# Patient Record
Sex: Female | Born: 1995 | Race: Black or African American | Hispanic: No | Marital: Single | Smoking: Current every day smoker
Health system: Southern US, Community
[De-identification: ages and names within clinical notes are randomized; demographics above are authoritative.]

## PROBLEM LIST (undated history)

## (undated) ENCOUNTER — Inpatient Hospital Stay (HOSPITAL_COMMUNITY): Payer: Self-pay

## (undated) ENCOUNTER — Emergency Department (HOSPITAL_COMMUNITY): Admission: EM | Payer: Medicaid Other | Source: Home / Self Care

## (undated) DIAGNOSIS — Z789 Other specified health status: Secondary | ICD-10-CM

## (undated) DIAGNOSIS — F419 Anxiety disorder, unspecified: Secondary | ICD-10-CM

## (undated) DIAGNOSIS — D649 Anemia, unspecified: Secondary | ICD-10-CM

## (undated) DIAGNOSIS — R569 Unspecified convulsions: Secondary | ICD-10-CM

## (undated) HISTORY — PX: DILATION AND CURETTAGE OF UTERUS: SHX78

## (undated) HISTORY — DX: Anxiety disorder, unspecified: F41.9

## (undated) HISTORY — DX: Unspecified convulsions: R56.9

## (undated) HISTORY — DX: Anemia, unspecified: D64.9

## (undated) HISTORY — PX: WISDOM TOOTH EXTRACTION: SHX21

---

## 2016-09-12 ENCOUNTER — Emergency Department (HOSPITAL_COMMUNITY)
Admission: EM | Admit: 2016-09-12 | Discharge: 2016-09-12 | Disposition: A | Discharge status: Home / Self Care | Payer: Self-pay | Attending: Emergency Medicine | Admitting: Emergency Medicine

## 2016-09-12 ENCOUNTER — Encounter (HOSPITAL_COMMUNITY): Payer: Self-pay | Admitting: Emergency Medicine

## 2016-09-12 ENCOUNTER — Emergency Department (HOSPITAL_COMMUNITY): Payer: Self-pay

## 2016-09-12 DIAGNOSIS — M79675 Pain in left toe(s): Secondary | ICD-10-CM

## 2016-09-12 DIAGNOSIS — B351 Tinea unguium: Secondary | ICD-10-CM | POA: Insufficient documentation

## 2016-09-12 DIAGNOSIS — F17200 Nicotine dependence, unspecified, uncomplicated: Secondary | ICD-10-CM | POA: Insufficient documentation

## 2016-09-12 MED ORDER — CICLOPIROX 8 % EX SOLN: Freq: Every day | CUTANEOUS | 0 refills | Status: DC

## 2016-09-12 MED ORDER — IBUPROFEN 400 MG PO TABS: 400.0000 mg | ORAL_TABLET | Freq: Four times a day (QID) | ORAL | 0 refills | Status: DC | PRN

## 2016-09-12 NOTE — ED Provider Notes (Signed)
[facility]-EMERGENCY DEPT Provider Note   CSN: [id number] Arrival date & time: 09/12/16  1455  By signing my name below, I, Phyllis Alexander, attest that this documentation has been prepared under the direction and in the presence of [employer] Gurpreet Mariani, PA-C. Electronically Signed: Angelene GiovanniEmmanuella Alexander, ED Scribe. 09/12/16. 4:30 PM.   History   Chief Complaint Chief Complaint  Patient presents with  . Toe Pain    HPI Comments: Phyllis Alexander is a 20 y.o. female who presents to the Emergency Department complaining of gradual onset, moderate left great toe pain onset 2-3 months ago. She notes that she normally feels the pain when "something is hitting it". She explains that she began experiencing the pain after wearing new shoes for work but she is here today because, she fell last night and stubbed her toe on the sidewalk which exacerbated the pain. No alleviating factors noted. Pt has not tried any medications PTA. She denies any fever, chills, or any other symptoms.   The history is provided by the patient. No language interpreter was used.    History reviewed. No pertinent past medical history.  There are no active problems to display for this patient.   History reviewed. No pertinent surgical history.  OB History    No data available       Home Medications    Prior to Admission medications   Medication Sig Start Date End Date Taking? Authorizing Provider  ciclopirox (PENLAC) 8 % solution Apply topically at bedtime. Apply over nail and surrounding skin. Apply daily over previous coat. After seven (7) days, may remove with alcohol and continue cycle. 09/12/16   Chrishonda Hesch, PA-C  ibuprofen (ADVIL,MOTRIN) 400 MG tablet Take 1 tablet (400 mg total) by mouth every 6 (six) hours as needed. 09/12/16   Joni ReiningNicole Quandre Polinski, PA-C    Family History No family history on file.  Social History Social History  Substance Use Topics  . Smoking status: Current Some Day Smoker  . Smokeless  tobacco: Not on file  . Alcohol use No     Allergies   Review of patient's allergies indicates not on file.   Review of Systems Review of Systems  A complete 10 system review of systems was obtained and all systems are negative except as noted in the HPI and PMH.    Physical Exam Updated Vital Signs BP 100/66 (BP Location: Right Arm)   Pulse 84   Temp 98.2 F (36.8 C) (Oral)   Resp 16   LMP 08/29/2016   SpO2 97%   Physical Exam  Constitutional: She is oriented to person, place, and time. She appears well-developed and well-nourished. No distress.  HENT:  Head: Normocephalic and atraumatic.  Eyes: Conjunctivae and EOM are normal.  Cardiovascular: Normal rate.   Pulmonary/Chest: Effort normal. No stridor.  Musculoskeletal: Normal range of motion.  Left great toe with no deformity, warmth, tenderness to palpation, all toenails are thickened and quite long, curving over the toe  Neurological: She is alert and oriented to person, place, and time.  Skin: Skin is warm and dry.  Psychiatric: She has a normal mood and affect.  Nursing note and vitals reviewed.    ED Treatments / Results  DIAGNOSTIC STUDIES: Oxygen Saturation is 100% on RA, normal by my interpretation.    COORDINATION OF CARE: 4:25 PM- Pt advised of plan for treatment and pt agrees. She will receive left great toe x-ray for further evaluation.    Labs (all labs ordered are listed, but only abnormal  results are displayed) Labs Reviewed - No data to display  EKG  EKG Interpretation None       Radiology Dg Toe Great Left  Result Date: 09/12/2016 CLINICAL DATA:  LEFT great toe pain for 2 months, worsened pain with tight socks and shoes, no known injury EXAM: LEFT GREAT TOE COMPARISON:  None FINDINGS: Osseous mineralization normal. Joint spaces preserved. No fracture, dislocation, or bone destruction. IMPRESSION: Normal exam. Electronically Signed   By: Ulyses SouthwardMark  Boles M.D.   On: 09/12/2016 16:25     Procedures Procedures (including critical care time)  Medications Ordered in ED Medications - No data to display   Initial Impression / Assessment and Plan / ED Course  Wynetta EmeryNicole Tymara Saur, PA-C has reviewed the triage vital signs and the nursing notes.  Pertinent labs & imaging results that were available during my care of the patient were reviewed by me and considered in my medical decision making (see chart for details).  Clinical Course    Vitals:   09/12/16 1502 09/12/16 1645  BP: 114/70 100/66  Pulse: 85 84  Resp: 18 16  Temp: 98.2 F (36.8 C)   TempSrc: Oral   SpO2: 100% 97%   Phyllis SciaraShekira Gasaway is 20 y.o. female presenting with Intermittent pain to the left great toe, worse when she is wearing shoes, she has very long thickened and toenails consistent with onychomycosis. X-ray negative. No signs of infection. Patient is started on topical antifungals, given podiatry referral.  Evaluation does not show pathology that would require ongoing emergent intervention or inpatient treatment. Pt is hemodynamically stable and mentating appropriately. Discussed findings and plan with patient/guardian, who agrees with care plan. All questions answered. Return precautions discussed and outpatient follow up given.   Final Clinical Impressions(s) / ED Diagnoses   Final diagnoses:  Great toe pain, left  Onychomycosis    New Prescriptions New Prescriptions   CICLOPIROX (PENLAC) 8 % SOLUTION    Apply topically at bedtime. Apply over nail and surrounding skin. Apply daily over previous coat. After seven (7) days, may remove with alcohol and continue cycle.   IBUPROFEN (ADVIL,MOTRIN) 400 MG TABLET    Take 1 tablet (400 mg total) by mouth every 6 (six) hours as needed.   I personally performed the services described in this documentation, which was scribed in my presence. The recorded information has been reviewed and is accurate.    [name], PA-C 09/12/16 1651    Rolland PorterMark  James, MD 09/22/16 [id number]

## 2016-09-12 NOTE — Discharge Instructions (Signed)
Do not hesitate to return to the emergency room for any new, worsening or concerning symptoms. ° °Please obtain primary care using resource guide below. Let them know that you were seen in the emergency room and that they will need to obtain records for further outpatient management. ° ° °

## 2016-09-12 NOTE — ED Triage Notes (Signed)
Per pt, states she fell last night injuring left large toe

## 2016-11-18 ENCOUNTER — Encounter (HOSPITAL_COMMUNITY): Payer: Self-pay | Admitting: Emergency Medicine

## 2016-11-18 ENCOUNTER — Emergency Department (HOSPITAL_COMMUNITY)
Admission: EM | Admit: 2016-11-18 | Discharge: 2016-11-18 | Disposition: A | Discharge status: Home / Self Care | Payer: Medicaid Other | Attending: Emergency Medicine | Admitting: Emergency Medicine

## 2016-11-18 DIAGNOSIS — R109 Unspecified abdominal pain: Secondary | ICD-10-CM | POA: Diagnosis present

## 2016-11-18 DIAGNOSIS — N946 Dysmenorrhea, unspecified: Secondary | ICD-10-CM | POA: Insufficient documentation

## 2016-11-18 DIAGNOSIS — R112 Nausea with vomiting, unspecified: Secondary | ICD-10-CM

## 2016-11-18 DIAGNOSIS — R197 Diarrhea, unspecified: Secondary | ICD-10-CM

## 2016-11-18 DIAGNOSIS — Z79899 Other long term (current) drug therapy: Secondary | ICD-10-CM | POA: Diagnosis not present

## 2016-11-18 DIAGNOSIS — F17200 Nicotine dependence, unspecified, uncomplicated: Secondary | ICD-10-CM | POA: Insufficient documentation

## 2016-11-18 LAB — COMPREHENSIVE METABOLIC PANEL
ALT: 11 U/L — AB (ref 14–54)
ANION GAP: 12 (ref 5–15)
AST: 23 U/L (ref 15–41)
Albumin: 4.8 g/dL (ref 3.5–5.0)
Alkaline Phosphatase: 32 U/L — ABNORMAL LOW (ref 38–126)
BUN: <5 mg/dL — ABNORMAL LOW (ref 6–20)
CHLORIDE: 105 mmol/L (ref 101–111)
CO2: 20 mmol/L — AB (ref 22–32)
Calcium: 10.3 mg/dL (ref 8.9–10.3)
Creatinine, Ser: 0.95 mg/dL (ref 0.44–1.00)
GFR calc non Af Amer: >60 mL/min (ref >60)
Glucose, Bld: 112 mg/dL — ABNORMAL HIGH (ref 65–99)
Potassium: 4.3 mmol/L (ref 3.5–5.1)
Sodium: 137 mmol/L (ref 135–145)
Total Bilirubin: 0.6 mg/dL (ref 0.3–1.2)
Total Protein: 8.1 g/dL (ref 6.5–8.1)

## 2016-11-18 LAB — URINALYSIS, ROUTINE W REFLEX MICROSCOPIC
Bacteria, UA: NONE SEEN
Glucose, UA: NEGATIVE mg/dL
Ketones, ur: NEGATIVE mg/dL
Leukocytes, UA: NEGATIVE
Nitrite: NEGATIVE
PROTEIN: 100 mg/dL — AB
Specific Gravity, Urine: 1.025 (ref 1.005–1.030)
WBC UA: NONE SEEN WBC/hpf (ref 0–5)
pH: 7 (ref 5.0–8.0)

## 2016-11-18 LAB — LIPASE, BLOOD: LIPASE: 22 U/L (ref 11–51)

## 2016-11-18 LAB — CBC
HEMATOCRIT: 40.8 % (ref 36.0–46.0)
HEMOGLOBIN: 14.3 g/dL (ref 12.0–15.0)
MCH: 31.2 pg (ref 26.0–34.0)
MCHC: 35 g/dL (ref 30.0–36.0)
MCV: 89.1 fL (ref 78.0–100.0)
Platelets: 198 10*3/uL (ref 150–400)
RBC: 4.58 MIL/uL (ref 3.87–5.11)
RDW: 12.4 % (ref 11.5–15.5)
WBC: 9.8 10*3/uL (ref 4.0–10.5)

## 2016-11-18 LAB — I-STAT BETA HCG BLOOD, ED (MC, WL, AP ONLY)

## 2016-11-18 MED ORDER — ONDANSETRON HCL 4 MG PO TABS: 4.0000 mg | ORAL_TABLET | Freq: Four times a day (QID) | ORAL | 0 refills | Status: DC

## 2016-11-18 MED ORDER — DICYCLOMINE HCL 20 MG PO TABS: 20.0000 mg | ORAL_TABLET | Freq: Two times a day (BID) | ORAL | 0 refills | Status: DC | PRN

## 2016-11-18 MED ORDER — ONDANSETRON 4 MG PO TBDP
ORAL_TABLET | ORAL | Status: AC
  Filled 2016-11-18: qty 1

## 2016-11-18 NOTE — ED Triage Notes (Addendum)
N/v/ d ,abd pain started this am early  , , pt vomiting at triage started her period this am ,  denies dysuria. Last ate cookout last night

## 2016-11-18 NOTE — ED Notes (Signed)
Requested urine sample from patient. Reports she can't go at this time.

## 2016-11-18 NOTE — ED Provider Notes (Signed)
[facility]-EMERGENCY DEPT Provider Note   CSN: [id number] Arrival date & time: 11/18/16  1128  By signing my name below, I, Phyllis Alexander, attest that this documentation has been prepared under the direction and in the presence of Loren Raceravid Biagio Snelson, MD. Electronically Signed: Alyssa GroveMartin Alexander, ED Scribe. 11/18/16. 12:54 PM.   History   Chief Complaint Chief Complaint  Patient presents with  . Emesis  . Abdominal Pain   The history is provided by the patient. No language interpreter was used.   HPI Comments: Phyllis Alexander is a 21 y.o. female who presents to the Emergency Department complaining of gradual onset, episodic vomiting and diarrhea onset 9 AM this morning. Pt reports associated abdominal pain and back pain. She states her sister had similar symptoms (vomiting and diarrhea) yesterday. Pt states she is beginning her menstrual cycle and that back pain is a common symptom. She denies blood in vomit, blood in diarrhea, dysuria. No fever or chills.  History reviewed. No pertinent past medical history.  There are no active problems to display for this patient.   No past surgical history on file.  OB History    No data available       Home Medications    Prior to Admission medications   Medication Sig Start Date End Date Taking? Authorizing Provider  ciclopirox (PENLAC) 8 % solution Apply topically at bedtime. Apply over nail and surrounding skin. Apply daily over previous coat. After seven (7) days, may remove with alcohol and continue cycle. 09/12/16   Nicole Pisciotta, PA-C  dicyclomine (BENTYL) 20 MG tablet Take 1 tablet (20 mg total) by mouth 2 (two) times daily as needed for spasms. 11/18/16   Loren Raceravid Lakeisha Waldrop, MD  ondansetron (ZOFRAN) 4 MG tablet Take 1 tablet (4 mg total) by mouth every 6 (six) hours. 11/18/16   Loren Raceravid Deran Barro, MD    Family History No family history on file.  Social History Social History  Substance Use Topics  . Smoking status: Current Some Day Smoker  .  Smokeless tobacco: Never Used  . Alcohol use No     Allergies   Patient has no known allergies.   Review of Systems Review of Systems  Constitutional: Negative for chills and fever.  HENT: Negative for sore throat.   Respiratory: Negative for cough and shortness of breath.   Cardiovascular: Negative for chest pain.  Gastrointestinal: Positive for abdominal pain, nausea and vomiting. Negative for abdominal distention, constipation and diarrhea.  Genitourinary: Positive for vaginal bleeding. Negative for dysuria, flank pain and frequency.  Musculoskeletal: Positive for back pain and myalgias. Negative for neck pain and neck stiffness.  Skin: Negative for rash and wound.  Neurological: Negative for dizziness, weakness, light-headedness, numbness and headaches.  All other systems reviewed and are negative.  Physical Exam Updated Vital Signs BP 125/76   Pulse 64   Temp 97.7 F (36.5 C) (Oral)   Resp 18   SpO2 100%   Physical Exam  Constitutional: She is oriented to person, place, and time. She appears well-developed and well-nourished.  HENT:  Head: Normocephalic and atraumatic.  Mouth/Throat: Oropharynx is clear and moist. No oropharyngeal exudate.  Eyes: EOM are normal. Pupils are equal, round, and reactive to light.  Neck: Normal range of motion. Neck supple.  Cardiovascular: Normal rate and regular rhythm.   Pulmonary/Chest: Effort normal and breath sounds normal.  Abdominal: Soft. Bowel sounds are normal. There is tenderness (diffuse abdominal tenderness to palpation which is inconsistent. Appears to abate when distracted.). There is no  rebound and no guarding.  Musculoskeletal: Normal range of motion. She exhibits no edema or tenderness.  No CVA tenderness bilaterally. No midline thoracic or lumbar tenderness.  Neurological: She is alert and oriented to person, place, and time.  Moving all extremities without deficit. Sensation fully intact.  Skin: Skin is warm and dry.  Capillary refill takes less than 2 seconds. No rash noted. No erythema.  Psychiatric: She has a normal mood and affect. Her behavior is normal.  Nursing note and vitals reviewed.    ED Treatments / Results  DIAGNOSTIC STUDIES: Oxygen Saturation is 99% on RA, normal by my interpretation.    COORDINATION OF CARE: 12:53 PM Discussed treatment plan with pt at bedside which includes Zofran and CBC, Urinalysis, Lipase and CMP and pt agreed to plan.  Labs (all labs ordered are listed, but only abnormal results are displayed) Labs Reviewed  COMPREHENSIVE METABOLIC PANEL - Abnormal; Notable for the following:       Result Value   CO2 20 (*)    Glucose, Bld 112 (*)    BUN <5 (*)    ALT 11 (*)    Alkaline Phosphatase 32 (*)    All other components within normal limits  URINALYSIS, ROUTINE W REFLEX MICROSCOPIC - Abnormal; Notable for the following:    Color, Urine AMBER (*)    APPearance CLOUDY (*)    Hgb urine dipstick MODERATE (*)    Bilirubin Urine SMALL (*)    Protein, ur 100 (*)    Squamous Epithelial / LPF 0-5 (*)    All other components within normal limits  LIPASE, BLOOD  CBC  I-STAT BETA HCG BLOOD, ED ([facility], [facility], [facility] ONLY)    EKG  EKG Interpretation None       Radiology No results found.  Procedures Procedures (including critical care time)  Medications Ordered in ED Medications - No data to display   Initial Impression / Assessment and Plan / ED Course  I have reviewed the triage vital signs and the nursing notes.  Pertinent labs & imaging results that were available during my care of the patient were reviewed by me and considered in my medical decision making (see chart for details).  Clinical Course    No further vomiting in the emergency department. Patient is resting comfortably. Labs are reassuring. No evidence of dehydration. Benign repeat abd exam. We'll discharge home with Zofran and patient's been given return precautions.   Final Clinical  Impressions(s) / ED Diagnoses   Final diagnoses:  Nausea vomiting and diarrhea  Dysmenorrhea    New Prescriptions Discharge Medication List as of 11/18/2016  3:31 PM    START taking these medications   Details  dicyclomine (BENTYL) 20 MG tablet Take 1 tablet (20 mg total) by mouth 2 (two) times daily as needed for spasms., Starting Sun 11/18/2016, Print    ondansetron (ZOFRAN) 4 MG tablet Take 1 tablet (4 mg total) by mouth every 6 (six) hours., Starting Sun 11/18/2016, Print      I personally performed the services described in this documentation, which was scribed in my presence. The recorded information has been reviewed and is accurate.       Loren Racer, MD 11/21/16 1056

## 2016-11-24 ENCOUNTER — Emergency Department (HOSPITAL_COMMUNITY): Payer: Medicaid Other

## 2016-11-24 ENCOUNTER — Emergency Department (HOSPITAL_COMMUNITY)
Admission: EM | Admit: 2016-11-24 | Discharge: 2016-11-25 | Disposition: A | Discharge status: Home / Self Care | Payer: Medicaid Other | Attending: Emergency Medicine | Admitting: Emergency Medicine

## 2016-11-24 ENCOUNTER — Encounter (HOSPITAL_COMMUNITY): Payer: Self-pay | Admitting: Emergency Medicine

## 2016-11-24 DIAGNOSIS — F17200 Nicotine dependence, unspecified, uncomplicated: Secondary | ICD-10-CM | POA: Insufficient documentation

## 2016-11-24 DIAGNOSIS — R1011 Right upper quadrant pain: Secondary | ICD-10-CM | POA: Diagnosis present

## 2016-11-24 DIAGNOSIS — B349 Viral infection, unspecified: Secondary | ICD-10-CM | POA: Diagnosis not present

## 2016-11-24 DIAGNOSIS — Z79899 Other long term (current) drug therapy: Secondary | ICD-10-CM | POA: Insufficient documentation

## 2016-11-24 LAB — URINALYSIS, ROUTINE W REFLEX MICROSCOPIC
Bilirubin Urine: NEGATIVE
GLUCOSE, UA: NEGATIVE mg/dL
KETONES UR: 80 mg/dL — AB
Leukocytes, UA: NEGATIVE
Nitrite: NEGATIVE
PH: 6 (ref 5.0–8.0)
Protein, ur: NEGATIVE mg/dL
SPECIFIC GRAVITY, URINE: 1.026 (ref 1.005–1.030)

## 2016-11-24 LAB — LIPASE, BLOOD: Lipase: 17 U/L (ref 11–51)

## 2016-11-24 LAB — COMPREHENSIVE METABOLIC PANEL
ALK PHOS: 37 U/L — AB (ref 38–126)
ALT: 12 U/L — AB (ref 14–54)
AST: 19 U/L (ref 15–41)
Albumin: 5.1 g/dL — ABNORMAL HIGH (ref 3.5–5.0)
Anion gap: 9 (ref 5–15)
BUN: 7 mg/dL (ref 6–20)
CALCIUM: 9.9 mg/dL (ref 8.9–10.3)
CO2: 24 mmol/L (ref 22–32)
CREATININE: 0.78 mg/dL (ref 0.44–1.00)
Chloride: 102 mmol/L (ref 101–111)
Glucose, Bld: 85 mg/dL (ref 65–99)
Potassium: 3.7 mmol/L (ref 3.5–5.1)
Sodium: 135 mmol/L (ref 135–145)
Total Bilirubin: 0.9 mg/dL (ref 0.3–1.2)
Total Protein: 8.5 g/dL — ABNORMAL HIGH (ref 6.5–8.1)

## 2016-11-24 LAB — CBC
HCT: 42.6 % (ref 36.0–46.0)
Hemoglobin: 14.8 g/dL (ref 12.0–15.0)
MCH: 31.7 pg (ref 26.0–34.0)
MCHC: 34.7 g/dL (ref 30.0–36.0)
MCV: 91.2 fL (ref 78.0–100.0)
PLATELETS: 243 10*3/uL (ref 150–400)
RBC: 4.67 MIL/uL (ref 3.87–5.11)
RDW: 12.8 % (ref 11.5–15.5)
WBC: 8.1 10*3/uL (ref 4.0–10.5)

## 2016-11-24 MED ORDER — ONDANSETRON HCL 4 MG/2ML IJ SOLN
4.0000 mg | Freq: Once | INTRAMUSCULAR | Status: AC
  Administered 2016-11-24: 4 mg via INTRAVENOUS
  Filled 2016-11-24: qty 2

## 2016-11-24 MED ORDER — SODIUM CHLORIDE 0.9 % IV BOLUS (SEPSIS)
1000.0000 mL | Freq: Once | INTRAVENOUS | Status: AC
  Administered 2016-11-24: 1000 mL via INTRAVENOUS

## 2016-11-24 MED ORDER — KETOROLAC TROMETHAMINE 30 MG/ML IJ SOLN
30.0000 mg | Freq: Once | INTRAMUSCULAR | Status: AC
  Administered 2016-11-24: 30 mg via INTRAVENOUS
  Filled 2016-11-24: qty 1

## 2016-11-24 NOTE — ED Triage Notes (Signed)
Patient reports cough, sore throat, body aches, N/V/D since last night. Denies chest pain, SOB, and abdominal pain.

## 2016-11-24 NOTE — ED Notes (Signed)
Pt ambulated to the restroom with stand by assist only. No difficulty.

## 2016-11-24 NOTE — ED Notes (Signed)
No respiratory or acute distress noted alert and oriented x 3 call light in reach pt now asking for pain medication.

## 2016-11-24 NOTE — ED Provider Notes (Signed)
[facility]-EMERGENCY DEPT Provider Note   CSN: [id number] Arrival date & time: 11/24/16  1818  By signing my name below, I, Phyllis Alexander, attest that this documentation has been prepared under the direction and in the presence of [facility], PA-C. Electronically Signed: Alyssa Alexander, ED Scribe. 11/24/16. Marland Kitchen  History   Chief Complaint Chief Complaint  Patient presents with  . Emesis  . Diarrhea  . Cough   The history is provided by the patient. No language interpreter was used.   HPI Comments: Phyllis Alexander is a 21 y.o. female who presents to the Emergency Department complaining of gradual onset, constant, moderate generalized body aches for 1 day. Pt reports associated RUQ abdominal pain, dry cough, sore throat, nausea, vomiting, and diarrhea. Abdominal pain is exacerbated with movement. Pt is unaware of number of episodes of diarrhea. She states she has vomited numerous times. No treatments tried. She states she had a stomach virus earlier this week and started feeling better 2 days before onset of new symptoms. She has had sick contact at home. Pt denies fever, chest pain, shortness of breath, dysuria, and abnormal vaginal discharge.  History reviewed. No pertinent past medical history.  There are no active problems to display for this patient.   History reviewed. No pertinent surgical history.  OB History    No data available       Home Medications    Prior to Admission medications   Medication Sig Start Date End Date Taking? Authorizing Provider  ciclopirox (PENLAC) 8 % solution Apply topically at bedtime. Apply over nail and surrounding skin. Apply daily over previous coat. After seven (7) days, may remove with alcohol and continue cycle. 09/12/16   Nicole Pisciotta, PA-C  dicyclomine (BENTYL) 20 MG tablet Take 1 tablet (20 mg total) by mouth 2 (two) times daily as needed for spasms. 11/18/16   Loren Racer, MD  ondansetron (ZOFRAN) 4 MG tablet Take 1 tablet (4 mg total) by  mouth every 6 (six) hours. 11/18/16   Loren Racer, MD    Family History History reviewed. No pertinent family history.  Social History Social History  Substance Use Topics  . Smoking status: Current Some Day Smoker  . Smokeless tobacco: Never Used  . Alcohol use No     Allergies   Patient has no known allergies.   Review of Systems Review of Systems  HENT: Positive for sore throat.   Respiratory: Positive for cough.   Gastrointestinal: Positive for abdominal pain (RUQ), diarrhea, nausea and vomiting.  Musculoskeletal:       + Generalized body aches  All other systems reviewed and are negative.    Physical Exam Updated Vital Signs BP 121/73 (BP Location: Right Arm)   Pulse 103   Temp 99.8 F (37.7 C) (Oral)   Resp 18   Ht 5\' 8"  (1.727 m)   Wt 140 lb (63.5 kg)   LMP 11/22/2016   SpO2 100%   BMI 21.29 kg/m   Physical Exam  Constitutional: She is oriented to person, place, and time. She appears well-developed and well-nourished. No distress.  HENT:  Head: Normocephalic and atraumatic.  Oropharynx with erythemabut no exudate or tonsillar hypertrophy  Cardiovascular: Normal rate, regular rhythm and normal heart sounds.   No murmur heard. Pulmonary/Chest: Effort normal and breath sounds normal. No respiratory distress.  Abdominal: Soft. Bowel sounds are normal. She exhibits no distension. There is tenderness.  Tenderness to palpation of the RUQ with no rebounding or guarding. Negative for Murphy's sign.  Musculoskeletal: Normal range of motion.  Neurological: She is alert and oriented to person, place, and time.  Skin: Skin is warm and dry.  Nursing note and vitals reviewed.  ED Treatments / Results  DIAGNOSTIC STUDIES: Oxygen Saturation is 100% on RA, normal by my interpretation.    COORDINATION OF CARE: 8:34 PM Discussed treatment plan with pt at bedside which includes Zofran and US Abdomen RUQ and pt agreed to plan.  Labs (all labs ordered are listed,  but only abnormal results are displayed) Labs Reviewed  CBC  LIPASE, BLOOD  COMPREHENSIVE METABOLIC PANEL  URINALYSIS, ROUTINE W REFLEX MICROSCOPIC    EKG  EKG Interpretation None       Radiology No results found.  Procedures Procedures (including critical care time)  Medications Ordered in ED Medications - No data to display   Initial Impression / Assessment and Plan / ED Course  I have reviewed the triage vital signs and the nursing notes.  Pertinent labs & imaging results that were available during my care of the patient were reviewed by me and considered in my medical decision making (see chart for details).  Clinical Course    Phyllis Alexander is a 21 y.o. female who presents to ED with multiple complaints including n/v/d, ruq abdominal pain, generalized body aches, sore throat. On exam, patient is afebrile with normal vital signs and tenderness to palpation of the right upper quadrant. She exhibits no rebound or guarding-nonsurgical abdomen. Labs reviewed: Normal white count, CMP reassuring. UA without signs of infection. She does appear dehydrated-2 L IV fluids given. Right upper quadrant ultrasound reviewed: Common bile duct minimally distended with no other abnormalities. Normal LFTs. PCP follow-up encouraged. Patient feels improved after Toradol and fluids. Repeat abdominal exam with no tenderness / no peritoneal signs. Evaluation does not show pathology that would require ongoing emergent intervention or inpatient treatment. Symptomatic home care instructions discussed. Reasons to return to ED discussed and all questions answered.    Final Clinical Impressions(s) / ED Diagnoses   Final diagnoses:  None    New Prescriptions New Prescriptions   No medications on file   I personally performed the services described in this documentation, which was scribed in my presence. The recorded information has been reviewed and is accurate.    [facility] HospitalJaime Pilcher Ortha Metts,  PA-C 11/25/16 0023    Mancel BaleElliott Wentz, MD 11/26/16 0040

## 2016-11-24 NOTE — ED Notes (Signed)
No respiratory or acute distress noted alert and oriented x 3 call light in reach. 

## 2016-11-24 NOTE — ED Notes (Signed)
No respiratory or acute distress noted no reaction to medication noted call light in reach pt refused pain medication at this time.

## 2016-11-25 NOTE — Discharge Instructions (Signed)
Increase hydration. Ibuprofen as needed for body aches.  Follow up with your primary care doctor about your hospital visit.  The 'BRAT' diet is suggested, then progress to diet as tolerated as symptoms abate.  Bananas.  Rice.  Applesauce.  Toast (and other simple starches such as crackers, potatoes, noodles).   SEEK IMMEDIATE MEDICAL ATTENTION IF: You begin having localized abdominal pain that does not go away or becomes severe A temperature above 101 develops Repeated vomiting occurs (multiple uncontrollable episodes) or you are unable to keep fluids down Blood is being passed in stools or vomit (bright red or black tarry stools).  If you develop chest pain, difficulty breathing, dizziness or fainting, or become confused, poorly responsive, or inconsolable (young children).

## 2017-02-01 ENCOUNTER — Encounter (HOSPITAL_COMMUNITY): Payer: Self-pay | Admitting: *Deleted

## 2017-02-01 ENCOUNTER — Emergency Department (HOSPITAL_COMMUNITY)
Admission: EM | Admit: 2017-02-01 | Discharge: 2017-02-01 | Disposition: A | Discharge status: Home / Self Care | Payer: Medicaid Other | Attending: Emergency Medicine | Admitting: Emergency Medicine

## 2017-02-01 DIAGNOSIS — F17200 Nicotine dependence, unspecified, uncomplicated: Secondary | ICD-10-CM | POA: Diagnosis not present

## 2017-02-01 DIAGNOSIS — J069 Acute upper respiratory infection, unspecified: Secondary | ICD-10-CM | POA: Diagnosis not present

## 2017-02-01 DIAGNOSIS — B9789 Other viral agents as the cause of diseases classified elsewhere: Secondary | ICD-10-CM

## 2017-02-01 DIAGNOSIS — R05 Cough: Secondary | ICD-10-CM | POA: Diagnosis not present

## 2017-02-01 MED ORDER — GUAIFENESIN 100 MG/5ML PO LIQD: 100.0000 mg | ORAL | 0 refills | Status: DC | PRN

## 2017-02-01 MED ORDER — BENZONATATE 100 MG PO CAPS: 100.0000 mg | ORAL_CAPSULE | Freq: Three times a day (TID) | ORAL | 0 refills | Status: DC | PRN

## 2017-02-01 NOTE — ED Provider Notes (Signed)
[facility]-EMERGENCY DEPT Provider Note   CSN: [id number] Arrival date & time: 02/01/17  1436   By signing my name below, I, Soijett Blue, attest that this documentation has been prepared under the direction and in the presence of Fayrene HelperBowie Christoper Bushey, PA-C Electronically Signed: Soijett Blue, ED Scribe. 02/01/17. 3:57 PM.  History   Chief Complaint Chief Complaint  Patient presents with  . URI    HPI Phyllis Alexander is a 21 y.o. female who presents to the Emergency Department complaining of productive cough onset 2 weeks ago. Pt reports associated nasal congestion, ear pain, rhinorrhea, and sore throat. Pt has not tried any medications for the relief of her symptoms. Denies fever, hemoptysis, vomiting, diarrhea, and any other sypmtoms. Pt notes that she smokes 0.5 PPD of cigarettes.   The history is provided by the patient. No language interpreter was used.    History reviewed. No pertinent past medical history.  There are no active problems to display for this patient.   History reviewed. No pertinent surgical history.  OB History    No data available       Home Medications    Prior to Admission medications   Medication Sig Start Date End Date Taking? Authorizing Provider  ciclopirox (PENLAC) 8 % solution Apply topically at bedtime. Apply over nail and surrounding skin. Apply daily over previous coat. After seven (7) days, may remove with alcohol and continue cycle. Patient not taking: Reported on 11/24/2016 09/12/16   Joni ReiningNicole Pisciotta, PA-C  dicyclomine (BENTYL) 20 MG tablet Take 1 tablet (20 mg total) by mouth 2 (two) times daily as needed for spasms. 11/18/16   Loren Raceravid Yelverton, MD  ondansetron (ZOFRAN) 4 MG tablet Take 1 tablet (4 mg total) by mouth every 6 (six) hours. 11/18/16   Loren Raceravid Yelverton, MD    Family History No family history on file.  Social History Social History  Substance Use Topics  . Smoking status: Current Some Day Smoker  . Smokeless tobacco: Never Used  .  Alcohol use No     Allergies   Patient has no known allergies.   Review of Systems Review of Systems  Constitutional: Negative for fever.  HENT: Positive for congestion, ear pain, rhinorrhea and sore throat.   Respiratory: Positive for cough.   Gastrointestinal: Negative for diarrhea and vomiting.     Physical Exam Updated Vital Signs BP 114/67 (BP Location: Left Arm)   Pulse 89   Temp 98.6 F (37 C) (Oral)   Resp 18   LMP 01/11/2017   SpO2 99%   Physical Exam  Constitutional: She is oriented to person, place, and time. She appears well-developed and well-nourished. No distress.  HENT:  Head: Normocephalic and atraumatic.  Right Ear: Tympanic membrane, external ear and ear canal normal.  Left Ear: Tympanic membrane, external ear and ear canal normal.  Nose: Rhinorrhea present.  Mouth/Throat: Uvula is midline, oropharynx is clear and moist and mucous membranes are normal.  Mild rhinorrhea. Uvula midline. No tonsillar exudate. No trismus.  Eyes: EOM are normal.  Neck: Neck supple. No tracheal deviation present.  Trachea is midline.  Cardiovascular: Normal rate, regular rhythm and normal heart sounds.  Exam reveals no gallop and no friction rub.   No murmur heard. Pulmonary/Chest: Effort normal and breath sounds normal. No respiratory distress. She has no wheezes. She has no rales.  Abdominal: Soft. She exhibits no distension. There is no tenderness.  Musculoskeletal: Normal range of motion.  Neurological: She is alert and oriented to person, place,  and time.  Skin: Skin is warm and dry.  Psychiatric: She has a normal mood and affect. Her behavior is normal.  Nursing note and vitals reviewed.    ED Treatments / Results  DIAGNOSTIC STUDIES: Oxygen Saturation is 99% on RA, nl by my interpretation.    COORDINATION OF CARE: 3:53 PM Discussed treatment plan with pt at bedside which includes tessalon Rx, robitussin Rx, and pt agreed to plan.  Procedures Procedures  (including critical care time)  Medications Ordered in ED Medications - No data to display   Initial Impression / Assessment and Plan / ED Course  I have reviewed the triage vital signs and the nursing notes.   BP 114/67 (BP Location: Left Arm)   Pulse 89   Temp 98.6 F (37 C) (Oral)   Resp 18   LMP 01/11/2017   SpO2 99%     Pt symptoms consistent with URI. CXR not indicated due to pt being afebrile, not hypoxia, low suspicion for pneumonia, and lungs being clear to auscultation bilaterally. Pt will be discharged with tessalon and robitussin Rx. Advised use of symptomatic treatment.  Discussed return precautions.  Pt is hemodynamically stable & in NAD prior to discharge.  Final Clinical Impressions(s) / ED Diagnoses   Final diagnoses:  Viral URI with cough    New Prescriptions New Prescriptions   BENZONATATE (TESSALON) 100 MG CAPSULE    Take 1 capsule (100 mg total) by mouth 3 (three) times daily as needed for cough.   GUAIFENESIN (ROBITUSSIN) 100 MG/5ML LIQUID    Take 5-10 mLs (100-200 mg total) by mouth every 4 (four) hours as needed for congestion.    I personally performed the services described in this documentation, which was scribed in my presence. The recorded information has been reviewed and is accurate.       Fayrene Helper, PA-C 02/01/17 1558    Tilden Fossa, MD 02/04/17 1537

## 2017-02-01 NOTE — ED Triage Notes (Signed)
Pt complains of cough, nasal congestion x 2 weeks. Pt states she began feeling worse yesterday at work. Pt has not tried cough/cold medicine.Pt denies fever.

## 2017-02-01 NOTE — ED Notes (Signed)
ED Provider at bedside. 

## 2017-02-01 NOTE — ED Notes (Signed)
Discharge instructions, follow up care, and rx x2 reviewed with patient. Patient verbalized understanding. 

## 2017-02-17 ENCOUNTER — Emergency Department (HOSPITAL_COMMUNITY): Payer: Medicaid Other

## 2017-02-17 ENCOUNTER — Encounter (HOSPITAL_COMMUNITY): Payer: Self-pay | Admitting: Emergency Medicine

## 2017-02-17 ENCOUNTER — Emergency Department (HOSPITAL_COMMUNITY)
Admission: EM | Admit: 2017-02-17 | Discharge: 2017-02-17 | Disposition: A | Discharge status: Home / Self Care | Payer: Medicaid Other | Attending: Emergency Medicine | Admitting: Emergency Medicine

## 2017-02-17 DIAGNOSIS — R0789 Other chest pain: Secondary | ICD-10-CM | POA: Diagnosis not present

## 2017-02-17 DIAGNOSIS — F17200 Nicotine dependence, unspecified, uncomplicated: Secondary | ICD-10-CM | POA: Diagnosis not present

## 2017-02-17 DIAGNOSIS — R079 Chest pain, unspecified: Secondary | ICD-10-CM | POA: Diagnosis present

## 2017-02-17 LAB — CBC
HCT: 35.4 % — ABNORMAL LOW (ref 36.0–46.0)
Hemoglobin: 11.7 g/dL — ABNORMAL LOW (ref 12.0–15.0)
MCH: 30.2 pg (ref 26.0–34.0)
MCHC: 33.1 g/dL (ref 30.0–36.0)
MCV: 91.2 fL (ref 78.0–100.0)
PLATELETS: 286 10*3/uL (ref 150–400)
RBC: 3.88 MIL/uL (ref 3.87–5.11)
RDW: 13 % (ref 11.5–15.5)
WBC: 7.7 10*3/uL (ref 4.0–10.5)

## 2017-02-17 LAB — BASIC METABOLIC PANEL
Anion gap: 7 (ref 5–15)
BUN: 6 mg/dL (ref 6–20)
CHLORIDE: 104 mmol/L (ref 101–111)
CO2: 25 mmol/L (ref 22–32)
CREATININE: 0.92 mg/dL (ref 0.44–1.00)
Calcium: 9.2 mg/dL (ref 8.9–10.3)
GFR calc non Af Amer: >60 mL/min (ref >60)
GLUCOSE: 98 mg/dL (ref 65–99)
Potassium: 3.7 mmol/L (ref 3.5–5.1)
Sodium: 136 mmol/L (ref 135–145)

## 2017-02-17 LAB — I-STAT TROPONIN, ED: Troponin i, poc: 0 ng/mL (ref 0.00–0.08)

## 2017-02-17 LAB — I-STAT BETA HCG BLOOD, ED (MC, WL, AP ONLY): I-stat hCG, quantitative: <5 m[IU]/mL (ref <5)

## 2017-02-17 MED ORDER — IBUPROFEN 800 MG PO TABS: 800.0000 mg | ORAL_TABLET | Freq: Three times a day (TID) | ORAL | 0 refills | Status: DC

## 2017-02-17 MED ORDER — IBUPROFEN 800 MG PO TABS
800.0000 mg | ORAL_TABLET | Freq: Once | ORAL | Status: AC
  Administered 2017-02-17: 800 mg via ORAL
  Filled 2017-02-17: qty 1

## 2017-02-17 NOTE — ED Triage Notes (Signed)
Patient here with chest pain on the left side, states that it has been going on for a week.  She states that she has had a cough with the pain.  No nausea or vomiting, no shortness of breath.

## 2017-02-17 NOTE — ED Notes (Signed)
In room to assess patient.  Laughing and talking on cell phone.

## 2017-02-17 NOTE — ED Provider Notes (Addendum)
[facility]-EMERGENCY DEPT Provider Note   CSN: [id number] Arrival date & time: 02/17/17  0413     History   Chief Complaint Chief Complaint  Patient presents with  . Chest Pain  . Cough    HPI Phyllis Alexander is a 21 y.o. female.  The history is provided by the patient.  Chest Pain   This is a new problem. The current episode started more than 1 week ago. The problem occurs constantly. The problem has not changed since onset.The pain is associated with coughing. The pain is present in the lateral region. The pain is moderate. The quality of the pain is described as dull. The pain does not radiate. Associated symptoms include cough. Pertinent negatives include no diaphoresis, no dizziness, no exertional chest pressure, no fever, no leg pain, no lower extremity edema, no numbness, no orthopnea, no palpitations, no PND, no shortness of breath, no sputum production and no syncope. She has tried nothing for the symptoms. The treatment provided no relief. There are no known risk factors.  Pertinent negatives for past medical history include no aneurysm and no Marfan's syndrome.  Pertinent negatives for family medical history include: no early MI.  Procedure history is negative for stress echo.  Cough  This is a new problem. The current episode started more than 1 week ago. The problem occurs every few hours. The problem has not changed since onset.The cough is non-productive. There has been no fever. Pertinent negatives include no shortness of breath and no eye redness. She has tried nothing for the symptoms. The treatment provided no relief. Her past medical history does not include COPD.  Patient has been seen for this complaint already but has taken nothing for it, hence her symptoms have not gone away   History reviewed. No pertinent past medical history.  There are no active problems to display for this patient.   History reviewed. No pertinent surgical history.  OB History    No data  available       Home Medications    Prior to Admission medications   Medication Sig Start Date End Date Taking? Authorizing Provider  benzonatate (TESSALON) 100 MG capsule Take 1 capsule (100 mg total) by mouth 3 (three) times daily as needed for cough. 02/01/17   Fayrene Helper, PA-C  ciclopirox (PENLAC) 8 % solution Apply topically at bedtime. Apply over nail and surrounding skin. Apply daily over previous coat. After seven (7) days, may remove with alcohol and continue cycle. Patient not taking: Reported on 11/24/2016 09/12/16   Joni Reining Pisciotta, PA-C  dicyclomine (BENTYL) 20 MG tablet Take 1 tablet (20 mg total) by mouth 2 (two) times daily as needed for spasms. 11/18/16   Loren Racer, MD  guaiFENesin (ROBITUSSIN) 100 MG/5ML liquid Take 5-10 mLs (100-200 mg total) by mouth every 4 (four) hours as needed for congestion. 02/01/17   Fayrene Helper, PA-C  ondansetron (ZOFRAN) 4 MG tablet Take 1 tablet (4 mg total) by mouth every 6 (six) hours. 11/18/16   Loren Racer, MD    Family History History reviewed. No pertinent family history.  Social History Social History  Substance Use Topics  . Smoking status: Current Some Day Smoker  . Smokeless tobacco: Never Used  . Alcohol use No     Allergies   Patient has no known allergies.   Review of Systems Review of Systems  Constitutional: Negative for diaphoresis and fever.  Eyes: Negative for redness.  Respiratory: Positive for cough. Negative for sputum production, chest tightness, shortness  of breath and stridor.   Cardiovascular: Negative for palpitations, orthopnea, leg swelling, syncope and PND.  Neurological: Negative for dizziness and numbness.  All other systems reviewed and are negative.    Physical Exam Updated Vital Signs BP 122/68   Pulse 70   Temp 98.3 F (36.8 C) (Oral)   Resp 14   LMP 02/10/2017 (Approximate)   SpO2 100%   Physical Exam  Constitutional: She is oriented to person, place, and time. She appears  well-developed and well-nourished.  laughing in the room and texting  HENT:  Head: Normocephalic and atraumatic.  Mouth/Throat: No oropharyngeal exudate.  Eyes: Conjunctivae and EOM are normal. Pupils are equal, round, and reactive to light.  Neck: Normal range of motion. Neck supple.  Cardiovascular: Normal rate, regular rhythm, normal heart sounds and intact distal pulses.   Pulmonary/Chest: Effort normal and breath sounds normal. No stridor. She has no wheezes. She has no rales.     She exhibits tenderness.  Abdominal: Soft. Bowel sounds are normal. She exhibits no mass. There is no tenderness. There is no rebound and no guarding.  Musculoskeletal: She exhibits no edema, tenderness or deformity.  No cords negative homans sign  Lymphadenopathy:    She has no cervical adenopathy.  Neurological: She is alert and oriented to person, place, and time.  Skin: Skin is warm and dry. Capillary refill takes less than 2 seconds.  Psychiatric: She has a normal mood and affect.     ED Treatments / Results   Vitals:   02/17/17 0430 02/17/17 0445  BP: 114/70 122/68  Pulse: 71 70  Resp: 18 14  Temp:      Labs (all labs ordered are listed, but only abnormal results   Results for orders placed or performed during the hospital encounter of 02/17/17  Basic metabolic panel  Result Value Ref Range   Sodium 136 135 - 145 mmol/L   Potassium 3.7 3.5 - 5.1 mmol/L   Chloride 104 101 - 111 mmol/L   CO2 25 22 - 32 mmol/L   Glucose, Bld 98 65 - 99 mg/dL   BUN 6 6 - 20 mg/dL   Creatinine, Ser 4.09 0.44 - 1.00 mg/dL   Calcium 9.2 8.9 - 81.1 mg/dL   GFR calc non Af Amer >60 >60 mL/min   GFR calc Af Amer >60 >60 mL/min   Anion gap 7 5 - 15  CBC  Result Value Ref Range   WBC 7.7 4.0 - 10.5 K/uL   RBC 3.88 3.87 - 5.11 MIL/uL   Hemoglobin 11.7 (L) 12.0 - 15.0 g/dL   HCT 91.4 (L) 78.2 - 95.6 %   MCV 91.2 78.0 - 100.0 fL   MCH 30.2 26.0 - 34.0 pg   MCHC 33.1 30.0 - 36.0 g/dL   RDW 21.3 08.6 -  57.8 %   Platelets 286 150 - 400 K/uL  I-Stat Beta hCG blood, ED ([facility], [facility], [facility] only)  Result Value Ref Range   I-stat hCG, quantitative <5.0 <5 mIU/mL   Comment 3          I-stat troponin, ED  Result Value Ref Range   Troponin i, poc 0.00 0.00 - 0.08 ng/mL   Comment 3           No results found. EKG  EKG Interpretation  Date/Time:  Sunday Abrahan Fulmore 08 2018 04:35:23 EDT Ventricular Rate:  72 PR Interval:    QRS Duration: 86 QT Interval:  407 QTC Calculation: 446 R Axis:  82 Text Interpretation:  Sinus rhythm Confirmed by [facility]  MD, Dhillon Comunale ([id number]) on 02/17/2017 4:50:58 AM       Radiology No results found.  Procedures Procedures (including critical care time)  Medications Ordered in ED Medications  ibuprofen (ADVIL,MOTRIN) tablet 800 mg (not administered)    NO OCPS No car or plane tripd   Final Clinical Impressions(s) / ED Diagnoses  Chest wall pain.  PERC negative wells 0 highly doubt PE in this very low risk patient.  HEART score is 0 and is very low risk for MACE.  Pain is consistent with MSK pain.  NSAIDs and heat and follow up with your PMD.    The patient is nontoxic-appearing on exam and vital signs are within normal limits. Return for fevers intractable pain, intractable vomiting, shortness of breath coughing up blood leg pain fevers or any concerns.  After history, exam, and medical workup I feel the patient has been appropriately medically screened and is safe for discharge home. Pertinent diagnoses were discussed with the patient. Patient was given return precautions.  New Prescriptions New Prescriptions   No medications on file     Julien Oscar, MD 02/17/17 0513    Bethzy Hauck, MD 02/17/17 [phone]

## 2017-03-24 ENCOUNTER — Encounter (HOSPITAL_COMMUNITY): Payer: Self-pay | Admitting: Emergency Medicine

## 2017-03-24 ENCOUNTER — Emergency Department (HOSPITAL_COMMUNITY)
Admission: EM | Admit: 2017-03-24 | Discharge: 2017-03-24 | Disposition: A | Discharge status: Home / Self Care | Payer: Medicaid Other | Attending: Emergency Medicine | Admitting: Emergency Medicine

## 2017-03-24 DIAGNOSIS — Y929 Unspecified place or not applicable: Secondary | ICD-10-CM | POA: Insufficient documentation

## 2017-03-24 DIAGNOSIS — Y939 Activity, unspecified: Secondary | ICD-10-CM | POA: Diagnosis not present

## 2017-03-24 DIAGNOSIS — F17200 Nicotine dependence, unspecified, uncomplicated: Secondary | ICD-10-CM | POA: Insufficient documentation

## 2017-03-24 DIAGNOSIS — W57XXXA Bitten or stung by nonvenomous insect and other nonvenomous arthropods, initial encounter: Secondary | ICD-10-CM | POA: Diagnosis not present

## 2017-03-24 DIAGNOSIS — S40262A Insect bite (nonvenomous) of left shoulder, initial encounter: Secondary | ICD-10-CM | POA: Diagnosis not present

## 2017-03-24 DIAGNOSIS — S40861A Insect bite (nonvenomous) of right upper arm, initial encounter: Secondary | ICD-10-CM | POA: Insufficient documentation

## 2017-03-24 DIAGNOSIS — S70262A Insect bite (nonvenomous), left hip, initial encounter: Secondary | ICD-10-CM | POA: Insufficient documentation

## 2017-03-24 DIAGNOSIS — Y999 Unspecified external cause status: Secondary | ICD-10-CM | POA: Diagnosis not present

## 2017-03-24 DIAGNOSIS — S20362A Insect bite (nonvenomous) of left front wall of thorax, initial encounter: Secondary | ICD-10-CM | POA: Diagnosis not present

## 2017-03-24 MED ORDER — TRIAMCINOLONE ACETONIDE 0.1 % EX CREA: 1.0000 "application " | TOPICAL_CREAM | Freq: Two times a day (BID) | CUTANEOUS | 0 refills | Status: DC

## 2017-03-24 MED ORDER — DIPHENHYDRAMINE HCL 25 MG PO TABS: 25.0000 mg | ORAL_TABLET | Freq: Four times a day (QID) | ORAL | 0 refills | Status: DC | PRN

## 2017-03-24 MED ORDER — DIPHENHYDRAMINE HCL 25 MG PO CAPS
25.0000 mg | ORAL_CAPSULE | Freq: Once | ORAL | Status: AC
  Administered 2017-03-24: 25 mg via ORAL
  Filled 2017-03-24: qty 1

## 2017-03-24 NOTE — ED Triage Notes (Signed)
Pt reports rash or bug bites x 1 week in various spots over whole body. Used benadryl cream with minimal results

## 2017-03-24 NOTE — ED Provider Notes (Signed)
[facility]-EMERGENCY DEPT Provider Note   CSN: [id number] Arrival date & time: 03/24/17  1035  By signing my name below, I, Doreatha MartinEva Mathews, attest that this documentation has been prepared under the direction and in the presence of [place] Russo, PA-C. Electronically Signed: Doreatha MartinEva Mathews, ED Scribe. 03/24/17. 11:12 AM.    History   Chief Complaint Chief Complaint  Patient presents with  . Rash    HPI Phyllis Alexander is a 21 y.o. female otherwise healthy who presents to the Emergency Department complaining of a gradually worsening, diffuse, pruritic rash that began last week. Pt states the rash began on her arms, she has been seeing new areas in clusters daily, and believes they may be bug bites. There are no other members of the household with the same rash and no animals living in the home. She has tried OTC anti-itch spray with inadequate relief. No recent changes in medication, foods, detergents, outdoor exposure or tick bites. She does note that she started using her sister's lotion and sleeping in her living room before the onset of her rash, but has since switched rooms. No h/o DM, allergies or eczema. She denies fever, chills, difficulty breathing, tongue or lip swelling.   The history is provided by the patient. No language interpreter was used.    History reviewed. No pertinent past medical history.  There are no active problems to display for this patient.   Past Surgical History:  Procedure Laterality Date  . WISDOM TOOTH EXTRACTION      OB History    No data available       Home Medications    Prior to Admission medications   Medication Sig Start Date End Date Taking? Authorizing Provider  benzonatate (TESSALON) 100 MG capsule Take 1 capsule (100 mg total) by mouth 3 (three) times daily as needed for cough. Patient not taking: Reported on 02/17/2017 02/01/17   Fayrene Helperran, Bowie, PA-C  ciclopirox [facility](PENLAC) 8 % solution Apply topically at bedtime. Apply over nail and surrounding skin.  Apply daily over previous coat. After seven (7) days, may remove with alcohol and continue cycle. Patient not taking: Reported on 11/24/2016 09/12/16   Pisciotta, Joni ReiningNicole, PA-C  dicyclomine (BENTYL) 20 MG tablet Take 1 tablet (20 mg total) by mouth 2 (two) times daily as needed for spasms. Patient not taking: Reported on 02/17/2017 11/18/16   Loren RacerYelverton, David, MD  diphenhydrAMINE (BENADRYL) 25 MG tablet Take 1 tablet (25 mg total) by mouth every 6 (six) hours as needed for itching. 03/24/17   Russo, [place] N, PA-C  guaiFENesin (ROBITUSSIN) 100 MG/5ML liquid Take 5-10 mLs (100-200 mg total) by mouth every 4 (four) hours as needed for congestion. Patient not taking: Reported on 02/17/2017 02/01/17   Fayrene Helperran, Bowie, PA-C  ibuprofen (ADVIL,MOTRIN) 800 MG tablet Take 1 tablet (800 mg total) by mouth 3 (three) times daily. 02/17/17   Palumbo, April, MD  ondansetron (ZOFRAN) 4 MG tablet Take 1 tablet (4 mg total) by mouth every 6 (six) hours. Patient not taking: Reported on 02/17/2017 11/18/16   Loren RacerYelverton, David, MD  triamcinolone cream (KENALOG) 0.1 % Apply 1 application topically 2 (two) times daily. 03/24/17   Russo, [place] N, PA-C    Family History Family History  Problem Relation Age of Onset  . Diabetes Other     Social History Social History  Substance Use Topics  . Smoking status: Current Every Day Smoker    Packs/day: 0.50  . Smokeless tobacco: Never Used  . Alcohol use No  Allergies   Patient has no known allergies.   Review of Systems Review of Systems  Constitutional: Negative for chills and fever.  HENT: Negative for facial swelling.   Respiratory: Negative for shortness of breath.   Gastrointestinal: Negative for nausea and vomiting.  Skin: Positive for rash.    Physical Exam Updated Vital Signs BP 104/60   Pulse 90   Temp 98.2 F (36.8 C) (Oral)   Resp 16   Wt 64.9 kg   LMP 03/03/2017 (Approximate)   SpO2 98%   BMI 21.74 kg/m   Physical Exam  Constitutional: She appears  well-developed and well-nourished. No distress.  HENT:  Head: Normocephalic and atraumatic.  Mouth/Throat: Mucous membranes are normal.  Eyes: Conjunctivae are normal.  Neck: Normal range of motion. Neck supple.  Cardiovascular: Normal rate.   Pulmonary/Chest: Effort normal. No respiratory distress.  Skin: Rash noted.  Multiple areas of ~1cm sized red papules, not pustular or vesicular, in both clustered and linear distribution; located right upper medial arm, left chest, left posterior shoulder, left hip.  Psychiatric: She has a normal mood and affect. Her behavior is normal.  Nursing note and vitals reviewed.    ED Treatments / Results   DIAGNOSTIC STUDIES: Oxygen Saturation is 98% on RA, normal by my interpretation.    COORDINATION OF CARE: 11:09 AM Discussed treatment plan with pt at bedside which includes antihistamine and pt agreed to plan.   Procedures Procedures (including critical care time)  Medications Ordered in ED Medications  diphenhydrAMINE (BENADRYL) capsule 25 mg (25 mg Oral Given 03/24/17 1119)     Initial Impression / Assessment and Plan / ED Course  I have reviewed the triage vital signs and the nursing notes.      Pt w rash consistent with insect bites, suspect bed bugs. Pt w new clusters that appear every morning. Multiple areas of red papules both in clusters and linear distribution in multiple locations on body. Pt without angioedema or airway compromise. PO benadryl given in ED. Will send w topical steroid cream and PO benadryl. Encouraged pt to get exterminator, otherwise issue will keep occurring.   Patient advised to follow up with PCP as needed, referral given to establish care. Patient appears stable for discharge at this time.  Patient discussed with Dr. Silverio Lay.  Discussed results, findings, treatment and follow up. Patient advised of return precautions. Patient verbalized understanding and agreed with plan.  Final Clinical Impressions(s) / ED  Diagnoses   Final diagnoses:  Insect bite, initial encounter    New Prescriptions Discharge Medication List as of 03/24/2017 11:44 AM    START taking these medications   Details  diphenhydrAMINE (BENADRYL) 25 MG tablet Take 1 tablet (25 mg total) by mouth every 6 (six) hours as needed for itching., Starting Sun 03/24/2017, Print    triamcinolone cream (KENALOG) 0.1 % Apply 1 application topically 2 (two) times daily., Starting Sun 03/24/2017, Print        I personally performed the services described in this documentation, which was scribed in my presence. The recorded information has been reviewed and is accurate.    Russo, [place] N, PA-C 03/24/17 1204    Charlynne Pander, MD 03/28/17 [phone]

## 2017-03-24 NOTE — ED Notes (Signed)
Bed: [facility]11 Expected date:  Expected time:  Means of arrival:  Comments: 

## 2017-03-24 NOTE — Discharge Instructions (Signed)
Please read instructions below. Apply the steroid cream to rash 2 times daily, as needed for itching. You can take Benadryl as well for itching. Please beware this may make you drowsy, avoid driving and taking this medication. As the rash looks similar to bed bug bites, it is important that you call an exterminator to fix this problem. Return to the ER for swelling of your lips, face, or tongue, difficulty breathing, or new or worsening symptoms.

## 2017-07-06 ENCOUNTER — Emergency Department (HOSPITAL_COMMUNITY)
Admission: EM | Admit: 2017-07-06 | Discharge: 2017-07-06 | Disposition: A | Discharge status: Home / Self Care | Payer: Medicaid Other | Attending: Emergency Medicine | Admitting: Emergency Medicine

## 2017-07-06 ENCOUNTER — Encounter (HOSPITAL_COMMUNITY): Payer: Self-pay | Admitting: Emergency Medicine

## 2017-07-06 DIAGNOSIS — O26899 Other specified pregnancy related conditions, unspecified trimester: Secondary | ICD-10-CM | POA: Diagnosis not present

## 2017-07-06 DIAGNOSIS — R1030 Lower abdominal pain, unspecified: Secondary | ICD-10-CM | POA: Diagnosis present

## 2017-07-06 DIAGNOSIS — F17210 Nicotine dependence, cigarettes, uncomplicated: Secondary | ICD-10-CM | POA: Diagnosis not present

## 2017-07-06 DIAGNOSIS — Z3490 Encounter for supervision of normal pregnancy, unspecified, unspecified trimester: Secondary | ICD-10-CM | POA: Insufficient documentation

## 2017-07-06 LAB — URINALYSIS, ROUTINE W REFLEX MICROSCOPIC
Bilirubin Urine: NEGATIVE
GLUCOSE, UA: NEGATIVE mg/dL
Hgb urine dipstick: NEGATIVE
Ketones, ur: NEGATIVE mg/dL
Leukocytes, UA: NEGATIVE
NITRITE: NEGATIVE
PROTEIN: NEGATIVE mg/dL
Specific Gravity, Urine: 1.02 (ref 1.005–1.030)
WBC, UA: NONE SEEN WBC/hpf (ref 0–5)
pH: 6.5 (ref 5.0–8.0)

## 2017-07-06 LAB — COMPREHENSIVE METABOLIC PANEL
ALBUMIN: 3.5 g/dL (ref 3.5–5.0)
ALK PHOS: 27 U/L — AB (ref 38–126)
ALT: 11 U/L — AB (ref 14–54)
AST: 22 U/L (ref 15–41)
Anion gap: 7 (ref 5–15)
BILIRUBIN TOTAL: 0.5 mg/dL (ref 0.3–1.2)
BUN: 6 mg/dL (ref 6–20)
CALCIUM: 9.1 mg/dL (ref 8.9–10.3)
CO2: 21 mmol/L — ABNORMAL LOW (ref 22–32)
CREATININE: 0.6 mg/dL (ref 0.44–1.00)
Chloride: 106 mmol/L (ref 101–111)
GFR calc Af Amer: >60 mL/min (ref >60)
GFR calc non Af Amer: >60 mL/min (ref >60)
GLUCOSE: 97 mg/dL (ref 65–99)
Potassium: 3.6 mmol/L (ref 3.5–5.1)
Sodium: 134 mmol/L — ABNORMAL LOW (ref 135–145)
TOTAL PROTEIN: 6.7 g/dL (ref 6.5–8.1)

## 2017-07-06 LAB — HCG, QUANTITATIVE, PREGNANCY: HCG, BETA CHAIN, QUANT, S: 68785 m[IU]/mL — AB (ref <5)

## 2017-07-06 LAB — CBC
HCT: 33.8 % — ABNORMAL LOW (ref 36.0–46.0)
Hemoglobin: 11.9 g/dL — ABNORMAL LOW (ref 12.0–15.0)
MCH: 31.4 pg (ref 26.0–34.0)
MCHC: 35.2 g/dL (ref 30.0–36.0)
MCV: 89.2 fL (ref 78.0–100.0)
Platelets: 232 10*3/uL (ref 150–400)
RBC: 3.79 MIL/uL — ABNORMAL LOW (ref 3.87–5.11)
RDW: 12.9 % (ref 11.5–15.5)
WBC: 11.5 10*3/uL — ABNORMAL HIGH (ref 4.0–10.5)

## 2017-07-06 LAB — LIPASE, BLOOD: Lipase: 24 U/L (ref 11–51)

## 2017-07-06 LAB — POC URINE PREG, ED: Preg Test, Ur: POSITIVE — AB

## 2017-07-06 NOTE — ED Provider Notes (Signed)
[facility]-EMERGENCY DEPT Provider Note   CSN: [id number] Arrival date & time: 07/06/17  1327     History   Chief Complaint Chief Complaint  Patient presents with  . Abdominal Pain    HPI Phyllis Alexander is a 21 y.o. female without significant past medical history, presenting with 2 weeks of intermittent suprapubic and bilateral lower abdominal pain and cramping. Patient states pain comes and goes and feels like menstrual cramps, however sometimes it feels like a "knot." Has not taken medication for the pain. No modifying factors. Also reports low back ache as well as intermittent hot flashes. Denies fever/chills, nausea/vomiting, vaginal bleeding or discharge, urinary symptoms, or any other complaints today. She reports her last menstrual period was the end of May, and she was sexually active in June. She states she has not had a menstrual period since then.   The history is provided by the patient.    History reviewed. No pertinent past medical history.  There are no active problems to display for this patient.   Past Surgical History:  Procedure Laterality Date  . WISDOM TOOTH EXTRACTION      OB History    No data available       Home Medications    Prior to Admission medications   Medication Sig Start Date End Date Taking? Authorizing Provider  benzonatate (TESSALON) 100 MG capsule Take 1 capsule (100 mg total) by mouth 3 (three) times daily as needed for cough. Patient not taking: Reported on 02/17/2017 02/01/17   Fayrene Helper, PA-C  ciclopirox [facility]) 8 % solution Apply topically at bedtime. Apply over nail and surrounding skin. Apply daily over previous coat. After seven (7) days, may remove with alcohol and continue cycle. Patient not taking: Reported on 11/24/2016 09/12/16   Pisciotta, Joni Reining, PA-C  dicyclomine (BENTYL) 20 MG tablet Take 1 tablet (20 mg total) by mouth 2 (two) times daily as needed for spasms. Patient not taking: Reported on 02/17/2017 11/18/16   Loren Racer, MD  diphenhydrAMINE (BENADRYL) 25 MG tablet Take 1 tablet (25 mg total) by mouth every 6 (six) hours as needed for itching. Patient not taking: Reported on 07/06/2017 03/24/17   Russo, [place] N, PA-C  guaiFENesin (ROBITUSSIN) 100 MG/5ML liquid Take 5-10 mLs (100-200 mg total) by mouth every 4 (four) hours as needed for congestion. Patient not taking: Reported on 02/17/2017 02/01/17   Fayrene Helper, PA-C  ibuprofen (ADVIL,MOTRIN) 800 MG tablet Take 1 tablet (800 mg total) by mouth 3 (three) times daily. Patient not taking: Reported on 07/06/2017 02/17/17   Palumbo, April, MD  ondansetron (ZOFRAN) 4 MG tablet Take 1 tablet (4 mg total) by mouth every 6 (six) hours. Patient not taking: Reported on 02/17/2017 11/18/16   Loren Racer, MD  triamcinolone cream (KENALOG) 0.1 % Apply 1 application topically 2 (two) times daily. Patient not taking: Reported on 07/06/2017 03/24/17   Russo, [place] N, PA-C    Family History Family History  Problem Relation Age of Onset  . Diabetes Other     Social History Social History  Substance Use Topics  . Smoking status: Current Every Day Smoker    Packs/day: 0.50  . Smokeless tobacco: Never Used  . Alcohol use No     Allergies   Patient has no known allergies.   Review of Systems Review of Systems  Constitutional: Negative for chills and fever.  Gastrointestinal: Positive for abdominal pain. Negative for diarrhea, nausea and vomiting.  Genitourinary: Negative for dysuria, frequency, vaginal bleeding and vaginal  discharge.  Musculoskeletal: Positive for back pain.  Skin: Negative for color change.  All other systems reviewed and are negative.    Physical Exam Updated Vital Signs BP 117/68 (BP Location: Left Arm)   Pulse 78   Temp 98.6 F (37 C) (Oral)   Resp 14   Ht 5\' 8"  (1.727 m)   Wt 64.9 kg (143 lb)   SpO2 100%   BMI 21.74 kg/m   Physical Exam  Constitutional: She appears well-developed and well-nourished. No distress.  HENT:    Head: Normocephalic and atraumatic.  Mouth/Throat: Oropharynx is clear and moist.  Eyes: Conjunctivae are normal.  Cardiovascular: Normal rate, regular rhythm, normal heart sounds and intact distal pulses.  Exam reveals no friction rub.   No murmur heard. Pulmonary/Chest: Effort normal and breath sounds normal. No respiratory distress. She has no wheezes. She has no rales.  Abdominal: Soft. Bowel sounds are normal. She exhibits no distension and no mass. There is tenderness (suprapubic). There is no rebound and no guarding. No hernia.  Musculoskeletal: Normal range of motion.  No spinal or paraspinal tenderness  Neurological: She is alert.  Skin: Skin is warm.  Psychiatric: She has a normal mood and affect. Her behavior is normal.  Nursing note and vitals reviewed.    ED Treatments / Results  Labs (all labs ordered are listed, but only abnormal results are displayed) Labs Reviewed  COMPREHENSIVE METABOLIC PANEL - Abnormal; Notable for the following:       Result Value   Sodium 134 (*)    CO2 21 (*)    ALT 11 (*)    Alkaline Phosphatase 27 (*)    All other components within normal limits  CBC - Abnormal; Notable for the following:    WBC 11.5 (*)    RBC 3.79 (*)    Hemoglobin 11.9 (*)    HCT 33.8 (*)    All other components within normal limits  URINALYSIS, ROUTINE W REFLEX MICROSCOPIC - Abnormal; Notable for the following:    Bacteria, UA RARE (*)    Squamous Epithelial / LPF 0-5 (*)    All other components within normal limits  HCG, QUANTITATIVE, PREGNANCY - Abnormal; Notable for the following:    hCG, Beta Chain, Quant, [place] 40,981 (*)    All other components within normal limits  POC URINE PREG, ED - Abnormal; Notable for the following:    Preg Test, Ur POSITIVE (*)    All other components within normal limits  LIPASE, BLOOD    EKG  EKG Interpretation None       Radiology No results found.  Procedures Procedures (including critical care time) EMERGENCY  DEPARTMENT [place] PREGNANCY "Study: Limited Ultrasound of the Pelvis for Pregnancy"  INDICATIONS:Pregnancy(required) Multiple views of the uterus and pelvic cavity were obtained in real-time with a multi-frequency probe.  APPROACH:Transabdominal  PERFORMED BY: Myself IMAGES ARCHIVED?: Yes LIMITATIONS: none PREGNANCY FREE FLUID: None ADNEXAL FINDINGS:Left ovary not seen and Right ovary not seen GESTATIONAL AGE, ESTIMATE: >5weeks FETAL HEART RATE: 135 INTERPRETATION: Intrauterine gestational sac noted and Fetal heart activity seen    Medications Ordered in ED Medications - No data to display   Initial Impression / Assessment and Plan / ED Course  I have reviewed the triage vital signs and the nursing notes.  Pertinent labs & imaging results that were available during my care of the patient were reviewed by me and considered in my medical decision making (see chart for details).     Pt w  live intrauterine pregnancy, confirmed via transabdominal bedside U/S. No vaginal bleeding or discharge. Labs unremarkable. VSS. Pt not in distress. Pt reports she expected her pregnancy test to be positive. Patient discharged home with [facility] clinic referral and instructed to begin taking prenatal vitamins. Discussed symptomatic treatment and strict return precautions discussed.  Pt safe for discharge.  Patient discussed with Dr. Eudelia Bunch.  Discussed results, findings, treatment and follow up. Patient advised of return precautions. Patient verbalized understanding and agreed with plan.   Final Clinical Impressions(s) / ED Diagnoses   Final diagnoses:  Intrauterine pregnancy    New Prescriptions Discharge Medication List as of 07/06/2017  7:49 PM       Russo, [place] N, PA-C 07/06/17 2203    Nira Conn, MD 07/07/17 [phone]

## 2017-07-06 NOTE — Discharge Instructions (Signed)
Schedule an appointment with the women's clinic to establish obstetric care.  Begin taking prenatal vitamins daily. Return to the ER for vaginal bleeding, or new or concerning symptoms.

## 2017-07-06 NOTE — ED Triage Notes (Signed)
Pt reports abd pain, generalized body aches, and weakness for the past few days. Denies n/v/d or urinary symptoms.

## 2017-10-27 ENCOUNTER — Emergency Department (HOSPITAL_COMMUNITY)
Admission: EM | Admit: 2017-10-27 | Discharge: 2017-10-27 | Disposition: A | Discharge status: Home / Self Care | Payer: 59 | Attending: Emergency Medicine | Admitting: Emergency Medicine

## 2017-10-27 ENCOUNTER — Other Ambulatory Visit: Payer: Self-pay

## 2017-10-27 ENCOUNTER — Encounter (HOSPITAL_COMMUNITY): Payer: Self-pay | Admitting: *Deleted

## 2017-10-27 DIAGNOSIS — F17210 Nicotine dependence, cigarettes, uncomplicated: Secondary | ICD-10-CM | POA: Insufficient documentation

## 2017-10-27 DIAGNOSIS — R05 Cough: Secondary | ICD-10-CM | POA: Diagnosis present

## 2017-10-27 DIAGNOSIS — J069 Acute upper respiratory infection, unspecified: Secondary | ICD-10-CM | POA: Diagnosis not present

## 2017-10-27 DIAGNOSIS — Z79899 Other long term (current) drug therapy: Secondary | ICD-10-CM | POA: Insufficient documentation

## 2017-10-27 MED ORDER — ALBUTEROL SULFATE HFA 108 (90 BASE) MCG/ACT IN AERS
2.0000 | INHALATION_SPRAY | RESPIRATORY_TRACT | Status: DC | PRN
  Filled 2017-10-27: qty 6.7

## 2017-10-27 NOTE — ED Triage Notes (Signed)
Pt reports not feeling well for weeks. Reports having mild nausea, congestion and headache. No acute distress is noted at triage. lmp 11/22.

## 2017-10-27 NOTE — ED Provider Notes (Signed)
[facility] Provider Note   CSN: [id number] Arrival date & time: 10/27/17  1303     History   Chief Complaint Chief Complaint  Patient presents with  . URI    HPI Phyllis Alexander is a 21 y.o. female.  HPI  21 y.o. Female complaining of 2 weeks of nasal congestion, cough, some subjective fever resolved, taking po well.  Normal menstrual period. No flu shot.  Continues to smoke and co of some dyspnea.     History reviewed. No pertinent past medical history.  There are no active problems to display for this patient.   Past Surgical History:  Procedure Laterality Date  . WISDOM TOOTH EXTRACTION      OB History    No data available       Home Medications    Prior to Admission medications   Medication Sig Start Date End Date Taking? Authorizing Provider  benzonatate (TESSALON) 100 MG capsule Take 1 capsule (100 mg total) by mouth 3 (three) times daily as needed for cough. Patient not taking: Reported on 02/17/2017 02/01/17   Fayrene Helperran, Bowie, PA-C  ciclopirox [facility](PENLAC) 8 % solution Apply topically at bedtime. Apply over nail and surrounding skin. Apply daily over previous coat. After seven (7) days, may remove with alcohol and continue cycle. Patient not taking: Reported on 11/24/2016 09/12/16   Pisciotta, Joni ReiningNicole, PA-C  dicyclomine (BENTYL) 20 MG tablet Take 1 tablet (20 mg total) by mouth 2 (two) times daily as needed for spasms. Patient not taking: Reported on 02/17/2017 11/18/16   Loren RacerYelverton, David, MD  diphenhydrAMINE (BENADRYL) 25 MG tablet Take 1 tablet (25 mg total) by mouth every 6 (six) hours as needed for itching. Patient not taking: Reported on 07/06/2017 03/24/17   Robinson, [place] N, PA-C  guaiFENesin (ROBITUSSIN) 100 MG/5ML liquid Take 5-10 mLs (100-200 mg total) by mouth every 4 (four) hours as needed for congestion. Patient not taking: Reported on 02/17/2017 02/01/17   Fayrene Helperran, Bowie, PA-C  ibuprofen (ADVIL,MOTRIN) 800 MG tablet Take 1  tablet (800 mg total) by mouth 3 (three) times daily. Patient not taking: Reported on 07/06/2017 02/17/17   Palumbo, April, MD  ondansetron (ZOFRAN) 4 MG tablet Take 1 tablet (4 mg total) by mouth every 6 (six) hours. Patient not taking: Reported on 02/17/2017 11/18/16   Loren RacerYelverton, David, MD  triamcinolone cream (KENALOG) 0.1 % Apply 1 application topically 2 (two) times daily. Patient not taking: Reported on 07/06/2017 03/24/17   Robinson, [place] N, PA-C    Family History Family History  Problem Relation Age of Onset  . Diabetes Other     Social History Social History   Tobacco Use  . Smoking status: Current Every Day Smoker    Packs/day: 0.50  . Smokeless tobacco: Never Used  Substance Use Topics  . Alcohol use: No  . Drug use: Not on file     Allergies   Patient has no known allergies.   Review of Systems Review of Systems  All other systems reviewed and are negative.    Physical Exam Updated Vital Signs BP 114/64 (BP Location: Right Arm)   Pulse 83   Temp 99 F (37.2 C) (Oral)   Resp 18   LMP 10/03/2017   SpO2 99%   Physical Exam  Constitutional: She appears well-developed and well-nourished.  HENT:  Head: Normocephalic.  Right Ear: External ear normal.  Left Ear: External ear normal.  Nose: Nose normal.  Mouth/Throat: Oropharynx is clear and moist.  Eyes:  EOM are normal. Pupils are equal, round, and reactive to light.  Neck: Normal range of motion. Neck supple.  Cardiovascular: Normal rate, regular rhythm and normal heart sounds.  Pulmonary/Chest: Effort normal.  Few scattered rhonchi  Abdominal: Soft. Bowel sounds are normal.  Musculoskeletal: Normal range of motion.  Neurological: She is alert.  Skin: Skin is warm. Capillary refill takes less than 2 seconds.  Psychiatric: She has a normal mood and affect.  Nursing note and vitals reviewed.    ED Treatments / Results  Labs (all labs ordered are listed, but only abnormal results are displayed) Labs  Reviewed - No data to display  EKG  EKG Interpretation None       Radiology No results found.  Procedures Procedures (including critical care time)  Medications Ordered in ED Medications - No data to display   Initial Impression / Assessment and Plan / ED Course  I have reviewed the triage vital signs and the nursing notes.  Pertinent labs & imaging results that were available during my care of the patient were reviewed by me and considered in my medical decision making (see chart for details).      Final Clinical Impressions(s) / ED Diagnoses   Final diagnoses:  Viral upper respiratory tract infection    ED Discharge Orders    None       Margarita Grizzleay, Alyssha Housh, MD 10/27/17 1356

## 2017-11-12 NOTE — L&D Delivery Note (Addendum)
  1.91[facility] RehabilitatPrentiss Bellsdie [id number] GriArley [id number] Endoscopy Center [address] EXTTAG>a TAG> [phone] BellsZadie RhineKentucky

## 2018-04-20 ENCOUNTER — Encounter (HOSPITAL_COMMUNITY): Payer: Self-pay | Admitting: Nurse Practitioner

## 2018-04-20 DIAGNOSIS — R102 Pelvic and perineal pain: Secondary | ICD-10-CM | POA: Insufficient documentation

## 2018-04-20 DIAGNOSIS — N939 Abnormal uterine and vaginal bleeding, unspecified: Secondary | ICD-10-CM | POA: Diagnosis not present

## 2018-04-20 DIAGNOSIS — F17200 Nicotine dependence, unspecified, uncomplicated: Secondary | ICD-10-CM | POA: Insufficient documentation

## 2018-04-20 DIAGNOSIS — R1031 Right lower quadrant pain: Secondary | ICD-10-CM | POA: Insufficient documentation

## 2018-04-20 DIAGNOSIS — R1032 Left lower quadrant pain: Secondary | ICD-10-CM | POA: Diagnosis present

## 2018-04-20 LAB — COMPREHENSIVE METABOLIC PANEL
ALBUMIN: 4.1 g/dL (ref 3.5–5.0)
ALK PHOS: 35 U/L — AB (ref 38–126)
ALT: 10 U/L — AB (ref 14–54)
AST: 18 U/L (ref 15–41)
Anion gap: 6 (ref 5–15)
BUN: 6 mg/dL (ref 6–20)
CO2: 26 mmol/L (ref 22–32)
CREATININE: 0.82 mg/dL (ref 0.44–1.00)
Calcium: 9.3 mg/dL (ref 8.9–10.3)
Chloride: 105 mmol/L (ref 101–111)
GFR calc Af Amer: >60 mL/min (ref >60)
GFR calc non Af Amer: >60 mL/min (ref >60)
GLUCOSE: 90 mg/dL (ref 65–99)
Potassium: 3.8 mmol/L (ref 3.5–5.1)
SODIUM: 137 mmol/L (ref 135–145)
Total Bilirubin: 0.8 mg/dL (ref 0.3–1.2)
Total Protein: 7.1 g/dL (ref 6.5–8.1)

## 2018-04-20 LAB — CBC
HCT: 38.9 % (ref 36.0–46.0)
HEMOGLOBIN: 13.3 g/dL (ref 12.0–15.0)
MCH: 31.4 pg (ref 26.0–34.0)
MCHC: 34.2 g/dL (ref 30.0–36.0)
MCV: 91.7 fL (ref 78.0–100.0)
PLATELETS: 303 10*3/uL (ref 150–400)
RBC: 4.24 MIL/uL (ref 3.87–5.11)
RDW: 12.2 % (ref 11.5–15.5)
WBC: 6.1 10*3/uL (ref 4.0–10.5)

## 2018-04-20 LAB — I-STAT BETA HCG BLOOD, ED (MC, WL, AP ONLY)

## 2018-04-20 LAB — LIPASE, BLOOD: Lipase: 28 U/L (ref 11–51)

## 2018-04-20 NOTE — ED Notes (Signed)
Pt has 2 gold top tubes in  Main lab if needed Pt also has 1 pink top with blood band and pink slip in mini lab if needed

## 2018-04-20 NOTE — ED Triage Notes (Signed)
Pt is c/o LLQ abdominal pain that has been ongoing for the last 2 weeks, reports associated symptoms menstrual cycle for the last 2 weeks.

## 2018-04-21 ENCOUNTER — Emergency Department (HOSPITAL_COMMUNITY)
Admission: EM | Admit: 2018-04-21 | Discharge: 2018-04-21 | Discharge status: Against Medical Advice | Payer: 59 | Attending: Emergency Medicine | Admitting: Emergency Medicine

## 2018-04-21 ENCOUNTER — Other Ambulatory Visit: Payer: Self-pay

## 2018-04-21 DIAGNOSIS — R102 Pelvic and perineal pain: Secondary | ICD-10-CM

## 2018-04-21 DIAGNOSIS — R1031 Right lower quadrant pain: Secondary | ICD-10-CM | POA: Diagnosis not present

## 2018-04-21 DIAGNOSIS — N939 Abnormal uterine and vaginal bleeding, unspecified: Secondary | ICD-10-CM

## 2018-04-21 LAB — URINALYSIS, ROUTINE W REFLEX MICROSCOPIC
BACTERIA UA: NONE SEEN
Bilirubin Urine: NEGATIVE
GLUCOSE, UA: NEGATIVE mg/dL
KETONES UR: NEGATIVE mg/dL
LEUKOCYTES UA: NEGATIVE
NITRITE: NEGATIVE
PROTEIN: NEGATIVE mg/dL
Specific Gravity, Urine: 1.011 (ref 1.005–1.030)
pH: 8 (ref 5.0–8.0)

## 2018-04-21 LAB — WET PREP, GENITAL
SPERM: NONE SEEN
TRICH WET PREP: NONE SEEN
YEAST WET PREP: NONE SEEN

## 2018-04-21 LAB — GC/CHLAMYDIA PROBE AMP (~~LOC~~) NOT AT ARMC
Chlamydia: NEGATIVE
Neisseria Gonorrhea: NEGATIVE

## 2018-04-21 MED ORDER — AZITHROMYCIN 250 MG PO TABS: 1000.0000 mg | ORAL_TABLET | Freq: Once | ORAL | Status: DC

## 2018-04-21 MED ORDER — CEFTRIAXONE SODIUM 250 MG IJ SOLR: 250.0000 mg | Freq: Once | INTRAMUSCULAR | Status: DC

## 2018-04-21 MED ORDER — DOXYCYCLINE HYCLATE 100 MG PO TABS: 100.0000 mg | ORAL_TABLET | Freq: Once | ORAL | Status: DC

## 2018-04-21 MED ORDER — DOXYCYCLINE HYCLATE 100 MG PO CAPS: 100.0000 mg | ORAL_CAPSULE | Freq: Two times a day (BID) | ORAL | 0 refills | Status: DC

## 2018-04-21 NOTE — ED Provider Notes (Signed)
[facility]-EMERGENCY DEPT Provider Note   CSN: [id number] Arrival date & time: 04/20/18  2056     History   Chief Complaint Chief Complaint  Patient presents with  . Abdominal Pain    LLQ    HPI Phyllis Alexander is a 22 y.o. female [phone] with a hx of no major medical problems presents to the Emergency Department complaining of gradual, persistent, progressively worsening right lower quadrant abdominal pain onset 3 days ago.  She reports associated symptoms include vaginal bleeding approximately 2 weeks ago.  Patient reports that 4 months ago she started using a birth control patch but was unable to get it refilled.  She reports she menstruated regularly.  She reports she removed the patch proximal he 3 weeks ago and her menstrual cycle started 1 week later.  She states persistent spotting but denies known vaginal discharge.  She reports 3 days ago she developed intermittent achy right lower quadrant abdominal pain.  She reports previous surgical history of cesarean section.  Patient denies fever, chills, headache, neck pain, chest pain, shortness of breath, nausea, vomiting, diarrhea, weakness, dizziness, syncope, dysuria, urinary frequency, vaginal discharge.  Patient reports she is sexually active with one female partner but has not been tested for STDs in a long time.  Record review shows that patient had positive pregnancy test in August 2018.  Patient reports termination of this pregnancy.  No specific aggravating or alleviating factors.    The history is provided by the patient and medical records. No language interpreter was used.    History reviewed. No pertinent past medical history.  There are no active problems to display for this patient.   Past Surgical History:  Procedure Laterality Date  . WISDOM TOOTH EXTRACTION       OB History   None      Home Medications    Prior to Admission medications   Medication Sig Start Date End Date Taking?  Authorizing Provider  benzonatate (TESSALON) 100 MG capsule Take 1 capsule (100 mg total) by mouth 3 (three) times daily as needed for cough. Patient not taking: Reported on 02/17/2017 02/01/17   Fayrene Helper, PA-C  ciclopirox [facility]) 8 % solution Apply topically at bedtime. Apply over nail and surrounding skin. Apply daily over previous coat. After seven (7) days, may remove with alcohol and continue cycle. Patient not taking: Reported on 11/24/2016 09/12/16   Pisciotta, Joni Reining, PA-C  dicyclomine (BENTYL) 20 MG tablet Take 1 tablet (20 mg total) by mouth 2 (two) times daily as needed for spasms. Patient not taking: Reported on 02/17/2017 11/18/16   Loren Racer, MD  diphenhydrAMINE (BENADRYL) 25 MG tablet Take 1 tablet (25 mg total) by mouth every 6 (six) hours as needed for itching. Patient not taking: Reported on 07/06/2017 03/24/17   Robinson, [place] N, PA-C  doxycycline (VIBRAMYCIN) 100 MG capsule Take 1 capsule (100 mg total) by mouth 2 (two) times daily. 04/21/18   Fotini Lemus, Dahlia Client, PA-C  guaiFENesin (ROBITUSSIN) 100 MG/5ML liquid Take 5-10 mLs (100-200 mg total) by mouth every 4 (four) hours as needed for congestion. Patient not taking: Reported on 02/17/2017 02/01/17   Fayrene Helper, PA-C  ibuprofen (ADVIL,MOTRIN) 800 MG tablet Take 1 tablet (800 mg total) by mouth 3 (three) times daily. Patient not taking: Reported on 07/06/2017 02/17/17   Palumbo, April, MD  ondansetron (ZOFRAN) 4 MG tablet Take 1 tablet (4 mg total) by mouth every 6 (six) hours. Patient not taking: Reported on 02/17/2017 11/18/16   Ranae Palms,  Onalee Hua, MD  triamcinolone cream (KENALOG) 0.1 % Apply 1 application topically 2 (two) times daily. Patient not taking: Reported on 07/06/2017 03/24/17   Robinson, [place] N, PA-C    Family History Family History  Problem Relation Age of Onset  . Diabetes Other     Social History Social History   Tobacco Use  . Smoking status: Current Every Day Smoker    Packs/day: 0.50  . Smokeless  tobacco: Never Used  Substance Use Topics  . Alcohol use: No  . Drug use: Not on file     Allergies   Patient has no known allergies.   Review of Systems Review of Systems  Constitutional: Negative for appetite change, diaphoresis, fatigue, fever and unexpected weight change.  HENT: Negative for mouth sores.   Eyes: Negative for visual disturbance.  Respiratory: Negative for cough, chest tightness, shortness of breath and wheezing.   Cardiovascular: Negative for chest pain.  Gastrointestinal: Positive for abdominal pain. Negative for constipation, diarrhea, nausea and vomiting.  Endocrine: Negative for polydipsia, polyphagia and polyuria.  Genitourinary: Positive for vaginal bleeding. Negative for dysuria, frequency, hematuria and urgency.  Musculoskeletal: Negative for back pain and neck stiffness.  Skin: Negative for rash.  Allergic/Immunologic: Negative for immunocompromised state.  Neurological: Negative for syncope, light-headedness and headaches.  Hematological: Does not bruise/bleed easily.  Psychiatric/Behavioral: Negative for sleep disturbance. The patient is not nervous/anxious.      Physical Exam Updated Vital Signs BP (!) 118/57 (BP Location: Right Arm)   Pulse 70   Temp 98.1 F (36.7 C) (Oral)   Resp 18   Ht 5\' 8"  (1.727 m)   Wt 64.9 kg (143 lb)   SpO2 100%   BMI 21.74 kg/m   Physical Exam  Constitutional: She appears well-developed and well-nourished. No distress.  Awake, alert, nontoxic appearance  HENT:  Head: Normocephalic and atraumatic.  Mouth/Throat: Oropharynx is clear and moist. No oropharyngeal exudate.  Eyes: Conjunctivae are normal. No scleral icterus.  Neck: Normal range of motion. Neck supple.  Cardiovascular: Normal rate, regular rhythm, normal heart sounds and intact distal pulses.  No murmur heard. Pulmonary/Chest: Effort normal and breath sounds normal. No respiratory distress. She has no wheezes.  Equal chest expansion  Abdominal:  Soft. Bowel sounds are normal. She exhibits no mass. There is tenderness in the right lower quadrant. There is no rebound and no guarding. Hernia confirmed negative in the right inguinal area and confirmed negative in the left inguinal area.  Genitourinary: Uterus normal. No labial fusion. There is no rash, tenderness or lesion on the right labia. There is no rash, tenderness or lesion on the left labia. Uterus is not deviated, not enlarged, not fixed and not tender. Cervix exhibits no motion tenderness, no discharge and no friability. Right adnexum displays tenderness. Right adnexum displays no mass and no fullness. Left adnexum displays no mass, no tenderness and no fullness. There is bleeding ( Scant, brown) in the vagina. No erythema or tenderness in the vagina. No foreign body in the vagina. No signs of injury around the vagina. Vaginal discharge (scant, brown, malodorous) found.  Musculoskeletal: Normal range of motion. She exhibits no edema.  Lymphadenopathy:       Right: No inguinal adenopathy present.       Left: No inguinal adenopathy present.  Neurological: She is alert.  Speech is clear and goal oriented Moves extremities without ataxia  Skin: Skin is warm and dry. She is not diaphoretic. No erythema.  Psychiatric: She has a normal mood  and affect.  Nursing note and vitals reviewed.    ED Treatments / Results  Labs (all labs ordered are listed, but only abnormal results are displayed) Labs Reviewed  COMPREHENSIVE METABOLIC PANEL - Abnormal; Notable for the following components:      Result Value   ALT 10 (*)    Alkaline Phosphatase 35 (*)    All other components within normal limits  WET PREP, GENITAL  LIPASE, BLOOD  CBC  URINALYSIS, ROUTINE W REFLEX MICROSCOPIC  RPR  HIV ANTIBODY (ROUTINE TESTING)  I-STAT BETA HCG BLOOD, ED ([facility], [facility], [facility] ONLY)  GC/CHLAMYDIA PROBE AMP ([place]    Procedures Procedures (including critical care time)  Medications Ordered  in ED Medications  cefTRIAXone (ROCEPHIN) injection 250 mg (has no administration in time range)  azithromycin (ZITHROMAX) tablet 1,000 mg (has no administration in time range)  doxycycline (VIBRA-TABS) tablet 100 mg (has no administration in time range)     Initial Impression / Assessment and Plan / ED Course  I have reviewed the triage vital signs and the nursing notes.  Pertinent labs & imaging results that were available during my care of the patient were reviewed by me and considered in my medical decision making (see chart for details).  Clinical Course as of Apr 21 153  Mon Apr 21, 2018  0137 Negative  I-Stat beta hCG blood, ED [HM]  0138 No leukocytosis  WBC: 6.1 [HM]  0138 Afebrile without tachycardia or hypotension.  No evidence of sepsis.  Pulse Rate: 69 [HM]    Clinical Course User Index [HM] Oliviana Mcgahee, Dahlia ClientHannah, PA-C    Patient presents with right pelvic pain and vaginal bleeding for 2 weeks.  On exam she has some mild right adnexal tenderness without fullness or palpable mass.  No cervical motion tenderness.  Patient is without evidence of sirs or sepsis.  No leukocytosis.  Abdomen is without rebound or guarding.  Less likely to be diverticulitis, appendicitis, colitis.  Concern for possible STD versus PID.  Waxing and waning intermittent pain it does give rise to some concern for possible ovarian torsion however mild pain makes this somewhat less likely.  I have recommended a pelvic ultrasound for further evaluation.  Patient reports that she cannot stay for this test.  I have informed her that we will be unable to rule out ovarian torsion or tubo-ovarian abscess without an ultrasound or further testing.  Patient states that she understands this but cannot stay any longer.  She understands that leaving is AGAINST MEDICAL ADVICE.  I have suggested that we presumptively treat for possible STD and possible PID.  Patient is willing to receive antibiotics here and  prescription for home.  I have given patient strict return precautions including worsening pain, worsening vaginal discharge, development of fever, vomiting or other concerns.  I have also recommended that patient follow-up closely with OB/GYN.  She states understanding and is in agreement with the plan.  Final Clinical Impressions(s) / ED Diagnoses   Final diagnoses:  Right lower quadrant abdominal pain  Pelvic pain in female  Vaginal bleeding    ED Discharge Orders        Ordered    doxycycline (VIBRAMYCIN) 100 MG capsule  2 times daily     04/21/18 0147       Olivier Frayre, Dahlia ClientHannah, PA-C 04/21/18 0154    Shaune PollackIsaacs, Cameron, MD 04/21/18 0301

## 2018-04-21 NOTE — ED Notes (Signed)
Pt left AMA against all recommendations. Pt was advised to stay for blood draw and medications, however pt was stating that she needed to pick up her daughter. Pt left with work note printed by Arlys JohnBrian RN and left against all recommendations.

## 2018-04-21 NOTE — Discharge Instructions (Addendum)
1. Medications: doxycycline -it is important that you finish the entire prescription; usual home medications 2. Treatment: rest, drink plenty of fluids, use a condom with every sexual encounter 3. Follow Up: Please followup with your primary doctor in 3 days for discussion of your diagnoses and further evaluation after today's visit; if you do not have a primary care doctor use the resource guide provided to find one; Please return to the ER for worsening symptoms, high fevers or persistent vomiting.  You have been tested for HIV, syphilis, chlamydia and gonorrhea.  These results will be available in approximately 3 days.  Please inform all sexual partners if you test positive for any of these diseases.

## 2018-04-23 ENCOUNTER — Emergency Department (HOSPITAL_COMMUNITY)
Admission: EM | Admit: 2018-04-23 | Discharge: 2018-04-23 | Disposition: A | Discharge status: Home / Self Care | Payer: 59 | Attending: Emergency Medicine | Admitting: Emergency Medicine

## 2018-04-23 ENCOUNTER — Emergency Department (HOSPITAL_COMMUNITY)
Admission: EM | Admit: 2018-04-23 | Discharge: 2018-04-23 | Disposition: A | Discharge status: Home / Self Care | Payer: 59

## 2018-04-23 ENCOUNTER — Other Ambulatory Visit: Payer: Self-pay

## 2018-04-23 ENCOUNTER — Encounter (HOSPITAL_COMMUNITY): Payer: Self-pay | Admitting: Emergency Medicine

## 2018-04-23 DIAGNOSIS — R1030 Lower abdominal pain, unspecified: Secondary | ICD-10-CM | POA: Diagnosis present

## 2018-04-23 DIAGNOSIS — B9689 Other specified bacterial agents as the cause of diseases classified elsewhere: Secondary | ICD-10-CM

## 2018-04-23 DIAGNOSIS — N730 Acute parametritis and pelvic cellulitis: Secondary | ICD-10-CM

## 2018-04-23 DIAGNOSIS — N760 Acute vaginitis: Secondary | ICD-10-CM | POA: Diagnosis not present

## 2018-04-23 DIAGNOSIS — F17210 Nicotine dependence, cigarettes, uncomplicated: Secondary | ICD-10-CM | POA: Diagnosis not present

## 2018-04-23 LAB — URINALYSIS, ROUTINE W REFLEX MICROSCOPIC
Bilirubin Urine: NEGATIVE
GLUCOSE, UA: NEGATIVE mg/dL
Hgb urine dipstick: NEGATIVE
KETONES UR: NEGATIVE mg/dL
LEUKOCYTES UA: NEGATIVE
NITRITE: NEGATIVE
PROTEIN: NEGATIVE mg/dL
Specific Gravity, Urine: 1.012 (ref 1.005–1.030)
pH: 6 (ref 5.0–8.0)

## 2018-04-23 LAB — CBC
HEMATOCRIT: 37.6 % (ref 36.0–46.0)
Hemoglobin: 12.7 g/dL (ref 12.0–15.0)
MCH: 31.2 pg (ref 26.0–34.0)
MCHC: 33.8 g/dL (ref 30.0–36.0)
MCV: 92.4 fL (ref 78.0–100.0)
PLATELETS: 188 10*3/uL (ref 150–400)
RBC: 4.07 MIL/uL (ref 3.87–5.11)
RDW: 12.3 % (ref 11.5–15.5)
WBC: 9.9 10*3/uL (ref 4.0–10.5)

## 2018-04-23 LAB — COMPREHENSIVE METABOLIC PANEL
ALT: 9 U/L — ABNORMAL LOW (ref 14–54)
AST: 16 U/L (ref 15–41)
Albumin: 4.2 g/dL (ref 3.5–5.0)
Alkaline Phosphatase: 34 U/L — ABNORMAL LOW (ref 38–126)
Anion gap: 7 (ref 5–15)
BILIRUBIN TOTAL: 0.8 mg/dL (ref 0.3–1.2)
BUN: 5 mg/dL — AB (ref 6–20)
CHLORIDE: 106 mmol/L (ref 101–111)
CO2: 25 mmol/L (ref 22–32)
CREATININE: 0.77 mg/dL (ref 0.44–1.00)
Calcium: 8.9 mg/dL (ref 8.9–10.3)
GFR calc non Af Amer: >60 mL/min (ref >60)
Glucose, Bld: 85 mg/dL (ref 65–99)
POTASSIUM: 3.7 mmol/L (ref 3.5–5.1)
Sodium: 138 mmol/L (ref 135–145)
TOTAL PROTEIN: 7.3 g/dL (ref 6.5–8.1)

## 2018-04-23 LAB — I-STAT BETA HCG BLOOD, ED (MC, WL, AP ONLY): I-stat hCG, quantitative: <5 m[IU]/mL (ref <5)

## 2018-04-23 LAB — LIPASE, BLOOD: LIPASE: 24 U/L (ref 11–51)

## 2018-04-23 MED ORDER — LIDOCAINE HCL 1 % IJ SOLN
INTRAMUSCULAR | Status: AC
  Administered 2018-04-23: 20 mL
  Filled 2018-04-23: qty 20

## 2018-04-23 MED ORDER — DOXYCYCLINE HYCLATE 100 MG PO CAPS: 100.0000 mg | ORAL_CAPSULE | Freq: Two times a day (BID) | ORAL | 0 refills | Status: DC

## 2018-04-23 MED ORDER — AZITHROMYCIN 1 G PO PACK
1.0000 g | PACK | Freq: Once | ORAL | Status: AC
  Administered 2018-04-23: 1 g via ORAL
  Filled 2018-04-23: qty 1

## 2018-04-23 MED ORDER — IBUPROFEN 800 MG PO TABS: 800.0000 mg | ORAL_TABLET | Freq: Three times a day (TID) | ORAL | 0 refills | Status: DC

## 2018-04-23 MED ORDER — DOXYCYCLINE HYCLATE 100 MG PO TABS
100.0000 mg | ORAL_TABLET | Freq: Once | ORAL | Status: AC
  Administered 2018-04-23: 100 mg via ORAL
  Filled 2018-04-23: qty 1

## 2018-04-23 MED ORDER — ONDANSETRON HCL 4 MG PO TABS: 4.0000 mg | ORAL_TABLET | Freq: Four times a day (QID) | ORAL | 0 refills | Status: DC

## 2018-04-23 MED ORDER — CEFTRIAXONE SODIUM 250 MG IJ SOLR
250.0000 mg | Freq: Once | INTRAMUSCULAR | Status: AC
  Administered 2018-04-23: 250 mg via INTRAMUSCULAR
  Filled 2018-04-23: qty 250

## 2018-04-23 MED ORDER — METRONIDAZOLE 500 MG PO TABS: 500.0000 mg | ORAL_TABLET | Freq: Two times a day (BID) | ORAL | 0 refills | Status: DC

## 2018-04-23 MED ORDER — KETOROLAC TROMETHAMINE 60 MG/2ML IM SOLN
60.0000 mg | Freq: Once | INTRAMUSCULAR | Status: AC
  Administered 2018-04-23: 60 mg via INTRAMUSCULAR
  Filled 2018-04-23: qty 2

## 2018-04-23 NOTE — ED Triage Notes (Signed)
Pt reports that she was seen here couple days ago for abd pains on right side but now all over and only feels better when sitting down. Denies any n/v/d or urinary problems.

## 2018-04-23 NOTE — ED Notes (Signed)
Discharge instructions reviewed with pt. Pt verbalized understanding. Pt to follow up with GYN. PIV removed. Pt ambulatory to waiting room.

## 2018-04-23 NOTE — ED Provider Notes (Signed)
Emergency Department Provider Note   I have reviewed the triage vital signs and the nursing notes.   HISTORY  Chief Complaint abd pain   HPI Phyllis Alexander is a 22 y.o. female without any medical problems presents emergency department today secondary to persistent suprapubic pain.  Patient was seen here a few days ago and ended up having bacterial vaginosis but did not start any of her antibiotics.  The pain is gotten a little bit worse since then a bit of nausea with it.  States is worse when she walks but better when she sits down.  Then her pain was more on the right side but now is more central.  No fevers.  No decreased appetite.  No significant back pain.  Vaginal discharge is improved and vaginal bleeding has ceased. No other associated or modifying symptoms.    History reviewed. No pertinent past medical history.  There are no active problems to display for this patient.   Past Surgical History:  Procedure Laterality Date  . WISDOM TOOTH EXTRACTION      Current Outpatient Rx  . Order #: [id number] Class: Print  . Order #: [id number] Class: Print  . Order #: [id number] Class: Print  . Order #: [id number] Class: Print    Allergies Patient has no known allergies.  Family History  Problem Relation Age of Onset  . Diabetes Other     Social History Social History   Tobacco Use  . Smoking status: Current Every Day Smoker    Packs/day: 0.50    Types: Cigarettes  . Smokeless tobacco: Never Used  Substance Use Topics  . Alcohol use: No  . Drug use: Not on file    Review of Systems  All other systems negative except as documented in the HPI. All pertinent positives and negatives as reviewed in the HPI. ____________________________________________   PHYSICAL EXAM:  VITAL SIGNS: ED Triage Vitals  Enc Vitals Group     BP 04/23/18 1419 109/74     Pulse Rate 04/23/18 1419 96     Resp 04/23/18 1419 17     Temp 04/23/18 1419 98.7 F (37.1 C)     Temp Source  04/23/18 1419 Oral     SpO2 04/23/18 1419 98 %     Weight 04/23/18 1421 143 lb (64.9 kg)     Height 04/23/18 1421 5\' 8"  (1.727 m)    Constitutional: Alert and oriented. Well appearing and in no acute distress. Eyes: Conjunctivae are normal. PERRL. EOMI. Head: Atraumatic. Nose: No congestion/rhinnorhea. Mouth/Throat: Mucous membranes are moist.  Oropharynx non-erythematous. Neck: No stridor.  No meningeal signs.   Cardiovascular: Normal rate, regular rhythm. Good peripheral circulation. Grossly normal heart sounds.   Respiratory: Normal respiratory effort.  No retractions. Lungs CTAB. Gastrointestinal: Soft and ttp over suprapubic area. No distention.  Musculoskeletal: No lower extremity tenderness nor edema. No gross deformities of extremities. Neurologic:  Normal speech and language. No gross focal neurologic deficits are appreciated.  Skin:  Skin is warm, dry and intact. No rash noted.   ____________________________________________   LABS (all labs ordered are listed, but only abnormal results are displayed)  Labs Reviewed  COMPREHENSIVE METABOLIC PANEL - Abnormal; Notable for the following components:      Result Value   BUN 5 (*)    ALT 9 (*)    Alkaline Phosphatase 34 (*)    All other components within normal limits  LIPASE, BLOOD  CBC  URINALYSIS, ROUTINE W REFLEX MICROSCOPIC  I-STAT BETA HCG BLOOD, ED ([facility],  [facility], [facility] ONLY)   ____________________________________________   INITIAL IMPRESSION / ASSESSMENT AND PLAN / ED COURSE  Suspect bacterial vaginosis causing pelvic inflammatory disease for this patient.  Her GC chlamydia were negative however as those are not completely reliable we will go and send her home on doxycycline and Flagyl.  We will follow-up if not improved in 3 to 5 days to make sure she does have a tubo-ovarian abscess.  No indication for ultrasound at this time.  Low suspicion for appendicitis as she has had symptoms for quite a few days without elevated  white blood cell count or right lower quadrant pain or fever.  Pertinent labs & imaging results that were available during my care of the patient were reviewed by me and considered in my medical decision making (see chart for details).  ____________________________________________  FINAL CLINICAL IMPRESSION(S) / ED DIAGNOSES  Final diagnoses:  PID (acute pelvic inflammatory disease)  Bacterial vaginosis     MEDICATIONS GIVEN DURING THIS VISIT:  Medications  doxycycline (VIBRA-TABS) tablet 100 mg (100 mg Oral Given 04/23/18 1900)  cefTRIAXone (ROCEPHIN) injection 250 mg (250 mg Intramuscular Given 04/23/18 1900)  azithromycin (ZITHROMAX) powder 1 g (1 g Oral Given 04/23/18 1900)  ketorolac (TORADOL) injection 60 mg (60 mg Intramuscular Given 04/23/18 1900)  lidocaine (XYLOCAINE) 1 % (with pres) injection (20 mLs  Given 04/23/18 1900)     NEW OUTPATIENT MEDICATIONS STARTED DURING THIS VISIT:  New Prescriptions   METRONIDAZOLE (FLAGYL) 500 MG TABLET    Take 1 tablet (500 mg total) by mouth 2 (two) times daily. One po bid x 14 days    Note:  This note was prepared with assistance of Dragon voice recognition software. Occasional wrong-word or sound-a-like substitutions may have occurred due to the inherent limitations of voice recognition software.   Marily Memos, MD 04/23/18 1904

## 2018-04-23 NOTE — ED Notes (Signed)
Patient left before triage. 

## 2018-04-23 NOTE — ED Notes (Signed)
ED Provider at bedside. 

## 2018-06-28 ENCOUNTER — Other Ambulatory Visit: Payer: Self-pay

## 2018-06-28 ENCOUNTER — Emergency Department (HOSPITAL_COMMUNITY)
Admission: EM | Admit: 2018-06-28 | Discharge: 2018-06-28 | Disposition: A | Discharge status: Home / Self Care | Payer: Medicaid Other | Attending: Emergency Medicine | Admitting: Emergency Medicine

## 2018-06-28 ENCOUNTER — Encounter (HOSPITAL_COMMUNITY): Payer: Self-pay

## 2018-06-28 DIAGNOSIS — O99331 Smoking (tobacco) complicating pregnancy, first trimester: Secondary | ICD-10-CM | POA: Insufficient documentation

## 2018-06-28 DIAGNOSIS — Z3A01 Less than 8 weeks gestation of pregnancy: Secondary | ICD-10-CM

## 2018-06-28 DIAGNOSIS — N751 Abscess of Bartholin's gland: Secondary | ICD-10-CM | POA: Diagnosis not present

## 2018-06-28 DIAGNOSIS — F17210 Nicotine dependence, cigarettes, uncomplicated: Secondary | ICD-10-CM | POA: Insufficient documentation

## 2018-06-28 DIAGNOSIS — N9089 Other specified noninflammatory disorders of vulva and perineum: Secondary | ICD-10-CM | POA: Diagnosis present

## 2018-06-28 DIAGNOSIS — Z3201 Encounter for pregnancy test, result positive: Secondary | ICD-10-CM | POA: Diagnosis not present

## 2018-06-28 DIAGNOSIS — O3481 Maternal care for other abnormalities of pelvic organs, first trimester: Secondary | ICD-10-CM | POA: Diagnosis not present

## 2018-06-28 LAB — POC URINE PREG, ED: PREG TEST UR: POSITIVE — AB

## 2018-06-28 LAB — I-STAT BETA HCG BLOOD, ED (MC, WL, AP ONLY): I-stat hCG, quantitative: 81.2 m[IU]/mL — ABNORMAL HIGH (ref <5)

## 2018-06-28 MED ORDER — FENTANYL CITRATE (PF) 100 MCG/2ML IJ SOLN
50.0000 ug | Freq: Once | INTRAMUSCULAR | Status: AC
  Administered 2018-06-28: 50 ug via INTRAMUSCULAR
  Filled 2018-06-28: qty 2

## 2018-06-28 MED ORDER — HYDROCODONE-ACETAMINOPHEN 5-325 MG PO TABS: 1.0000 | ORAL_TABLET | Freq: Four times a day (QID) | ORAL | 0 refills | Status: DC | PRN

## 2018-06-28 MED ORDER — OXYCODONE-ACETAMINOPHEN 5-325 MG PO TABS: 1.0000 | ORAL_TABLET | Freq: Once | ORAL | Status: DC

## 2018-06-28 MED ORDER — LIDOCAINE-EPINEPHRINE (PF) 2 %-1:200000 IJ SOLN
10.0000 mL | Freq: Once | INTRAMUSCULAR | Status: DC
  Filled 2018-06-28: qty 20

## 2018-06-28 MED ORDER — SODIUM BICARBONATE 4 % IV SOLN
5.0000 mL | Freq: Once | INTRAVENOUS | Status: DC
  Filled 2018-06-28: qty 5

## 2018-06-28 NOTE — Discharge Instructions (Addendum)
Please take Tylenol (acetaminophen) to relieve your pain.  You may take tylenol, up to 1,000 mg (two extra strength pills).  Do not take more than 3,000 mg tylenol in a 24 hour period.  Please check all medication labels as many medications such as pain and cold medications may contain tylenol. Please do not drink alcohol while taking this medication.   Please make sure you do not drink or use any drugs.  Avoid ibuprofen.  Please take a prenatal vitamin.

## 2018-06-28 NOTE — ED Triage Notes (Signed)
Pt reports lump near vaginal region about a week ago. Pt now reports pain upon walking.

## 2018-06-28 NOTE — ED Provider Notes (Signed)
[facility]-EMERGENCY DEPT Provider Note   CSN: [id number] Arrival date & time: 06/28/18  1114     History   Chief Complaint Chief Complaint  Patient presents with  . Abscess    HPI Phyllis Alexander is a 22 y.o. female who presents today for evaluation of left-sided labial pain and swelling.  She reports that this has been present for the past few days and has been gradually becoming more painful and larger.  She does not think she is pregnant, however she is unsure.  She has not tried any interventions at home prior to arrival.  No fevers, nausea vomiting or diarrhea.  She is sexually active.  She denies any vaginal discharge or drainage.  HPI  History reviewed. No pertinent past medical history.  There are no active problems to display for this patient.   Past Surgical History:  Procedure Laterality Date  . WISDOM TOOTH EXTRACTION       OB History   None      Home Medications    Prior to Admission medications   Medication Sig Start Date End Date Taking? Authorizing Provider  doxycycline (VIBRAMYCIN) 100 MG capsule Take 1 capsule (100 mg total) by mouth 2 (two) times daily. Patient not taking: Reported on 06/28/2018 04/23/18   Mesner, Barbara CowerJason, MD  HYDROcodone-acetaminophen (NORCO/VICODIN) 5-325 MG tablet Take 1 tablet by mouth every 6 (six) hours as needed for severe pain. 06/28/18   Cristina GongHammond, Camauri Craton W, PA-C  ibuprofen (ADVIL,MOTRIN) 800 MG tablet Take 1 tablet (800 mg total) by mouth 3 (three) times daily. Patient not taking: Reported on 06/28/2018 04/23/18   Mesner, Barbara CowerJason, MD  metroNIDAZOLE (FLAGYL) 500 MG tablet Take 1 tablet (500 mg total) by mouth 2 (two) times daily. One po bid x 14 days Patient not taking: Reported on 06/28/2018 04/23/18   Mesner, Barbara CowerJason, MD  ondansetron (ZOFRAN) 4 MG tablet Take 1 tablet (4 mg total) by mouth every 6 (six) hours. Patient not taking: Reported on 06/28/2018 04/23/18   Mesner, Barbara CowerJason, MD    Family History Family  History  Problem Relation Age of Onset  . Diabetes Other     Social History Social History   Tobacco Use  . Smoking status: Current Every Day Smoker    Packs/day: 0.50    Types: Cigarettes  . Smokeless tobacco: Never Used  Substance Use Topics  . Alcohol use: No  . Drug use: Not on file     Allergies   Patient has no known allergies.   Review of Systems Review of Systems  Constitutional: Negative for chills and fever.  Gastrointestinal: Negative for constipation, nausea and vomiting.  Genitourinary: Positive for genital sores and vaginal pain. Negative for decreased urine volume, dysuria, vaginal bleeding and vaginal discharge.  Skin: Negative for color change and wound.  All other systems reviewed and are negative.    Physical Exam Updated Vital Signs BP 113/70 (BP Location: Left Arm)   Pulse 76   Temp 99.3 F (37.4 C) (Oral)   Resp 16   Ht 5\' 8"  (1.727 m)   Wt 63.5 kg   LMP 06/09/2018   SpO2 100%   BMI 21.29 kg/m   Physical Exam  Constitutional: She appears well-developed and well-nourished. No distress.  HENT:  Head: Normocephalic and atraumatic.  Eyes: Conjunctivae are normal. Right eye exhibits no discharge. Left eye exhibits no discharge. No scleral icterus.  Neck: Normal range of motion.  Cardiovascular: Normal rate and regular rhythm.  Pulmonary/Chest: Effort normal. No  stridor. No respiratory distress.  Abdominal: She exhibits no distension.  Genitourinary:    There is tenderness on the left labia.  Genitourinary Comments: Exam performed with chaperone in room.  Internal speculum or bimanual exam was not performed.  Musculoskeletal: She exhibits no edema or deformity.  Neurological: She is alert. She exhibits normal muscle tone.  Skin: Skin is warm and dry. She is not diaphoretic.  Psychiatric: She has a normal mood and affect. Her behavior is normal.  Nursing note and vitals reviewed.    ED Treatments / Results  Labs (all labs ordered  are listed, but only abnormal results are displayed) Labs Reviewed  POC URINE PREG, ED - Abnormal; Notable for the following components:      Result Value   Preg Test, Ur POSITIVE (*)    All other components within normal limits  I-STAT BETA HCG BLOOD, ED ([facility], [facility], [facility] ONLY) - Abnormal; Notable for the following components:   I-stat hCG, quantitative 81.2 (*)    All other components within normal limits  POC URINE PREG, ED    EKG None  Radiology No results found.  Procedures .Marland KitchenIncision and Drainage Date/Time: 06/28/2018 4:02 PM Performed by: Cristina Gong, PA-C Authorized by: Cristina Gong, PA-C   Consent:    Consent obtained:  Verbal   Consent given by:  Patient   Risks discussed:  Bleeding, incomplete drainage, pain, infection and damage to other organs (Damage to other structures, need for additional procedures)   Alternatives discussed:  No treatment, alternative treatment and referral Location:    Indications for incision and drainage: Bartholin gland abscess. Pre-procedure details:    Skin preparation:  Chloraprep Anesthesia (see MAR for exact dosages):    Anesthesia method:  Local infiltration   Local anesthetic:  Lidocaine 2% WITH epi and sodium bicarbonate Procedure type:    Complexity:  Complex Procedure details:    Incision types:  Stab incision   Incision depth:  Subcutaneous   Scalpel blade:  11   Wound management:  Probed and deloculated and irrigated with saline   Drainage:  Purulent   Drainage amount:  Moderate   Wound treatment:  Wound left open   Packing materials:  None Post-procedure details:    Patient tolerance of procedure:  Tolerated well, no immediate complications   (including critical care time)  Medications Ordered in ED Medications  lidocaine-EPINEPHrine (XYLOCAINE W/EPI) 2 %-1:200000 (PF) injection 10 mL (0 mLs Infiltration Hold 06/28/18 1136)  sodium bicarbonate (NEUT) 4 % injection 5 mL (0 mLs Subcutaneous Hold 06/28/18  1158)  oxyCODONE-acetaminophen (PERCOCET/ROXICET) 5-325 MG per tablet 1 tablet (has no administration in time range)  fentaNYL (SUBLIMAZE) injection 50 mcg (50 mcg Intramuscular Given 06/28/18 1203)     Initial Impression / Assessment and Plan / ED Course  I have reviewed the triage vital signs and the nursing notes.  Pertinent labs & imaging results that were available during my care of the patient were reviewed by me and considered in my medical decision making (see chart for details).    Patient presents today for evaluation of left-sided vaginal swelling and pain for the past few days.  On exam is consistent with a Bartholin's gland abscess.  I&D was performed.  Patient's pain was treated in the department with fentanyl prior to procedure, along with Percocet after.  To help with the pain of the lidocaine injection sodium bicarb was mixed with the lidocaine.  Due to potential need for antibiotics, urine pregnancy test was obtained,  this was reportedly very faint per staff, and i-STAT hCG was ordered to confirm.  I-STAT hCG very mildly elevated.  Patient informed that she most likely is 1 to [redacted] weeks pregnant.  Pregnancy instructions were given to patient including to start taking prenatal vitamins, stop drinking alcohol.  We did discuss the risks of narcotic pain medicine during pregnancy and patient states her understanding.  Patient given follow-up with women's outpatient clinic for both pregnancy and abscess.  She was [facility] has any additional complications or concerns.  No surounding cellulitis, no antibiotics.  Recommended warm soaks.    Return precautions were discussed with patient who states their understanding.  At the time of discharge patient denied any unaddressed complaints or concerns.  Patient is agreeable for discharge home.    Final Clinical Impressions(s) / ED Diagnoses   Final diagnoses:  Bartholin's gland abscess    Less than [redacted] weeks gestation of pregnancy    ED Discharge Orders         Ordered    HYDROcodone-acetaminophen (NORCO/VICODIN) 5-325 MG tablet  Every 6 hours PRN     06/28/18 1431           Norman ClayHammond, Eddi Hymes W, PA-C 06/28/18 1614    Linwood DibblesKnapp, Jon, MD 06/29/18 [phone]

## 2018-08-22 ENCOUNTER — Emergency Department (HOSPITAL_COMMUNITY)
Admission: EM | Admit: 2018-08-22 | Discharge: 2018-08-22 | Disposition: A | Discharge status: Home / Self Care | Payer: Medicaid Other | Attending: Emergency Medicine | Admitting: Emergency Medicine

## 2018-08-22 ENCOUNTER — Ambulatory Visit (HOSPITAL_COMMUNITY): Payer: Medicaid Other

## 2018-08-22 ENCOUNTER — Encounter: Payer: Self-pay | Admitting: Emergency Medicine

## 2018-08-22 DIAGNOSIS — B9689 Other specified bacterial agents as the cause of diseases classified elsewhere: Secondary | ICD-10-CM

## 2018-08-22 DIAGNOSIS — N760 Acute vaginitis: Secondary | ICD-10-CM | POA: Insufficient documentation

## 2018-08-22 DIAGNOSIS — Z3A08 8 weeks gestation of pregnancy: Secondary | ICD-10-CM | POA: Insufficient documentation

## 2018-08-22 DIAGNOSIS — O30002 Twin pregnancy, unspecified number of placenta and unspecified number of amniotic sacs, second trimester: Secondary | ICD-10-CM

## 2018-08-22 DIAGNOSIS — F17210 Nicotine dependence, cigarettes, uncomplicated: Secondary | ICD-10-CM | POA: Insufficient documentation

## 2018-08-22 DIAGNOSIS — N939 Abnormal uterine and vaginal bleeding, unspecified: Secondary | ICD-10-CM

## 2018-08-22 DIAGNOSIS — O4690 Antepartum hemorrhage, unspecified, unspecified trimester: Secondary | ICD-10-CM

## 2018-08-22 DIAGNOSIS — O4692 Antepartum hemorrhage, unspecified, second trimester: Secondary | ICD-10-CM | POA: Diagnosis present

## 2018-08-22 LAB — CBC WITH DIFFERENTIAL/PLATELET
Abs Immature Granulocytes: 0.06 10*3/uL (ref 0.00–0.07)
BASOS ABS: 0 10*3/uL (ref 0.0–0.1)
Basophils Relative: 0 %
EOS PCT: 0 %
Eosinophils Absolute: 0 10*3/uL (ref 0.0–0.5)
HCT: 34.9 % — ABNORMAL LOW (ref 36.0–46.0)
HEMOGLOBIN: 11.3 g/dL — AB (ref 12.0–15.0)
IMMATURE GRANULOCYTES: 1 %
LYMPHS PCT: 9 %
Lymphs Abs: 1.2 10*3/uL (ref 0.7–4.0)
MCH: 29.1 pg (ref 26.0–34.0)
MCHC: 32.4 g/dL (ref 30.0–36.0)
MCV: 89.9 fL (ref 80.0–100.0)
Monocytes Absolute: 0.8 10*3/uL (ref 0.1–1.0)
Monocytes Relative: 6 %
NEUTROS ABS: 11.1 10*3/uL — AB (ref 1.7–7.7)
NEUTROS PCT: 84 %
NRBC: 0 % (ref 0.0–0.2)
Platelets: 292 10*3/uL (ref 150–400)
RBC: 3.88 MIL/uL (ref 3.87–5.11)
RDW: 12.3 % (ref 11.5–15.5)
WBC: 13.2 10*3/uL — AB (ref 4.0–10.5)

## 2018-08-22 LAB — COMPREHENSIVE METABOLIC PANEL
ALBUMIN: 3.2 g/dL — AB (ref 3.5–5.0)
ALK PHOS: 28 U/L — AB (ref 38–126)
ALT: 8 U/L (ref 0–44)
ANION GAP: 6 (ref 5–15)
AST: 15 U/L (ref 15–41)
BUN: 6 mg/dL (ref 6–20)
CALCIUM: 9.1 mg/dL (ref 8.9–10.3)
CO2: 21 mmol/L — ABNORMAL LOW (ref 22–32)
Chloride: 108 mmol/L (ref 98–111)
Creatinine, Ser: 0.56 mg/dL (ref 0.44–1.00)
GFR calc Af Amer: >60 mL/min (ref >60)
GFR calc non Af Amer: >60 mL/min (ref >60)
GLUCOSE: 81 mg/dL (ref 70–99)
POTASSIUM: 3.9 mmol/L (ref 3.5–5.1)
SODIUM: 135 mmol/L (ref 135–145)
Total Bilirubin: 0.5 mg/dL (ref 0.3–1.2)
Total Protein: 6.1 g/dL — ABNORMAL LOW (ref 6.5–8.1)

## 2018-08-22 LAB — WET PREP, GENITAL
Sperm: NONE SEEN
Trich, Wet Prep: NONE SEEN
Yeast Wet Prep HPF POC: NONE SEEN

## 2018-08-22 LAB — URINALYSIS, ROUTINE W REFLEX MICROSCOPIC
Bilirubin Urine: NEGATIVE
Glucose, UA: NEGATIVE mg/dL
Hgb urine dipstick: NEGATIVE
Ketones, ur: NEGATIVE mg/dL
Leukocytes, UA: NEGATIVE
Nitrite: NEGATIVE
Protein, ur: NEGATIVE mg/dL
Specific Gravity, Urine: 1.014 (ref 1.005–1.030)
pH: 8 (ref 5.0–8.0)

## 2018-08-22 LAB — TYPE AND SCREEN
ABO/RH(D): A POS
Antibody Screen: NEGATIVE

## 2018-08-22 LAB — ABO/RH: ABO/RH(D): A POS

## 2018-08-22 LAB — HCG, QUANTITATIVE, PREGNANCY: hCG, Beta Chain, Quant, S: 329675 m[IU]/mL — ABNORMAL HIGH (ref <5)

## 2018-08-22 MED ORDER — PRENATAL COMPLETE 14-0.4 MG PO TABS: ORAL_TABLET | ORAL | 0 refills | Status: DC

## 2018-08-22 MED ORDER — METRONIDAZOLE 250 MG PO TABS: 250.0000 mg | ORAL_TABLET | Freq: Three times a day (TID) | ORAL | 0 refills | Status: AC

## 2018-08-22 MED ORDER — ACETAMINOPHEN 325 MG PO TABS
650.0000 mg | ORAL_TABLET | Freq: Once | ORAL | Status: AC
  Administered 2018-08-22: 650 mg via ORAL
  Filled 2018-08-22: qty 2

## 2018-08-22 NOTE — ED Triage Notes (Signed)
Pt presents for evaluation of vaginal bleeding/spotting this morning with suprapubic cramping. Pt reports positive pregnancy test approximately 1 month ago. LMP approximately 06/22/18.

## 2018-08-22 NOTE — ED Notes (Signed)
Pt unable to urinate at this time.  

## 2018-08-22 NOTE — Discharge Instructions (Signed)
Take Flagyl as prescribed and complete the full course. Take a prenatal vitamin daily. Follow up with Ob, referral given. Return to ER or [facility] for worsening or concerning symptoms.

## 2018-08-22 NOTE — ED Notes (Signed)
ED Provider at bedside. 

## 2018-08-22 NOTE — ED Provider Notes (Signed)
G3P1 (1 AB), no prenatal care. 8 weeks by dates, c/o LLQ pain, spotting prior to arrival. Waiting for [place] to r/o ectopic. If negative, follow up OB. ABO +, does not need rhogam. Wet prep positive for BV.    Physical Exam  BP 103/69   Pulse 83   Resp 16   LMP 06/22/2018 (Approximate)   SpO2 100%   Physical Exam  ED Course/Procedures     Procedures  MDM   4:41 PM [place] reviewed, viable twin IUP, 12w 0d, no source of bleeding identified. Given rx for Flagyl for BV. Recommend follow up with OB, referral given. Discussed prenatal care including vitamin, avoid alcohol and drugs, eat well balanced diet and drink plenty of water. Advised to return to ER or [facility] for any concerns, otherwise, establish care with OB.       Phyllis Fend, PA-C 08/22/18 1643    Virgina Norfolk, DO 08/23/18 [phone]

## 2018-08-22 NOTE — ED Provider Notes (Signed)
[facility] EMERGENCY DEPARTMENT Provider Note   CSN: [id number] Arrival date & time: 08/22/18  1206     History   Chief Complaint Chief Complaint  Patient presents with  . Vaginal Bleeding    HPI Phyllis Alexander is a 22 y.o. female who is G3P1 and is currently 8 weeks by date with the last menstrual cycle on 06/22/2018 who presents emergency department today for vaginal bleeding.  Patient reports this morning she started having pain in her right lower abdomen/pelvis.  Shortly after she had a small amount of vaginal bleeding.  She reports since then the vaginal bleeding is stopped but her pain has persisted.  Patient reports that she has had one birth in the past by C-section as a 71-year-old.  She reports 1 abortion as well.  No history of miscarriages.  No associated fever, chills, chest pain, shortness of breath, urinary symptoms.  She does not have an OB/GYN.  She has not had a formal ultrasound at this time.  She denies any lightheadedness, dizziness, syncope or weakness. She is currently sexually active with one female partner. Denies vaginal discharge.   HPI  History reviewed. No pertinent past medical history.  There are no active problems to display for this patient.   Past Surgical History:  Procedure Laterality Date  . WISDOM TOOTH EXTRACTION       OB History    Gravida  1   Para      Term      Preterm      AB      Living        SAB      TAB      Ectopic      Multiple      Live Births               Home Medications    Prior to Admission medications   Medication Sig Start Date End Date Taking? Authorizing Provider  doxycycline (VIBRAMYCIN) 100 MG capsule Take 1 capsule (100 mg total) by mouth 2 (two) times daily. Patient not taking: Reported on 06/28/2018 04/23/18   Mesner, Barbara Cower, MD  HYDROcodone-acetaminophen (NORCO/VICODIN) 5-325 MG tablet Take 1 tablet by mouth every 6 (six) hours as needed for severe pain. 06/28/18   Cristina Gong, PA-C  ibuprofen (ADVIL,MOTRIN) 800 MG tablet Take 1 tablet (800 mg total) by mouth 3 (three) times daily. Patient not taking: Reported on 06/28/2018 04/23/18   Mesner, Barbara Cower, MD  metroNIDAZOLE (FLAGYL) 500 MG tablet Take 1 tablet (500 mg total) by mouth 2 (two) times daily. One po bid x 14 days Patient not taking: Reported on 06/28/2018 04/23/18   Mesner, Barbara Cower, MD  ondansetron (ZOFRAN) 4 MG tablet Take 1 tablet (4 mg total) by mouth every 6 (six) hours. Patient not taking: Reported on 06/28/2018 04/23/18   Mesner, Barbara Cower, MD    Family History Family History  Problem Relation Age of Onset  . Diabetes Other     Social History Social History   Tobacco Use  . Smoking status: Current Every Day Smoker    Packs/day: 0.50    Types: Cigarettes  . Smokeless tobacco: Never Used  Substance Use Topics  . Alcohol use: No  . Drug use: Not on file     Allergies   Patient has no known allergies.   Review of Systems Review of Systems  All other systems reviewed and are negative.    Physical Exam Updated Vital Signs BP 119/83 (BP  Location: Right Arm)   Pulse 97   Resp 16   LMP 06/22/2018 (Approximate)   SpO2 99%   Physical Exam  Constitutional: She appears well-developed and well-nourished.  HENT:  Head: Normocephalic and atraumatic.  Right Ear: External ear normal.  Left Ear: External ear normal.  Nose: Nose normal.  Mouth/Throat: Uvula is midline, oropharynx is clear and moist and mucous membranes are normal. No tonsillar exudate.  Eyes: Pupils are equal, round, and reactive to light. Right eye exhibits no discharge. Left eye exhibits no discharge. No scleral icterus.  Neck: Trachea normal. Neck supple. No spinous process tenderness present. No neck rigidity. Normal range of motion present.  Cardiovascular: Normal rate, regular rhythm and intact distal pulses.  No murmur heard. Pulses:      Radial pulses are 2+ on the right side, and 2+ on the left side.        Dorsalis pedis pulses are 2+ on the right side, and 2+ on the left side.       Posterior tibial pulses are 2+ on the right side, and 2+ on the left side.  No lower extremity swelling or edema. Calves symmetric in size bilaterally.  Pulmonary/Chest: Effort normal and breath sounds normal. She exhibits no tenderness.  Abdominal: Soft. Bowel sounds are normal. There is no tenderness. There is no rigidity, no rebound, no guarding and no CVA tenderness.  Genitourinary:  Genitourinary Comments: Exam performed by Jacinto Halim, exam chaperoned Pelvic exam: normal external genitalia without evidence of trauma. VULVA: normal appearing vulva with no masses, tenderness or lesion. VAGINA: normal appearing vagina with normal color and discharge, no lesions. CERVIX: normal appearing cervix without lesions, cervical motion tenderness absent, cervical os closed with out purulent discharge or bleeding; vaginal discharge - white/brown, Wet prep and DNA probe for chlamydia and GC obtained.   ADNEXA: normal adnexa in size, nontender and no masses UTERUS: uterus is normal size, shape, consistency and nontender.   Musculoskeletal: She exhibits no edema.  Lymphadenopathy:    She has no cervical adenopathy.  Neurological: She is alert.  Skin: Skin is warm and dry. No rash noted. She is not diaphoretic.  Psychiatric: She has a normal mood and affect.  Nursing note and vitals reviewed.    ED Treatments / Results  Labs (all labs ordered are listed, but only abnormal results are displayed) Labs Reviewed  HCG, QUANTITATIVE, PREGNANCY - Abnormal; Notable for the following components:      Result Value   hCG, Beta Chain, Quant, [place] 161,096 (*)    All other components within normal limits  WET PREP, GENITAL  URINE CULTURE  CBC WITH DIFFERENTIAL/PLATELET  COMPREHENSIVE METABOLIC PANEL  URINALYSIS, ROUTINE W REFLEX MICROSCOPIC  RPR  HIV ANTIBODY (ROUTINE TESTING W REFLEX)  TYPE AND SCREEN  ABO/RH    GC/CHLAMYDIA PROBE AMP ([place]) NOT AT [facility]    EKG None  Radiology No results found.  Procedures Procedures (including critical care time)  Medications Ordered in ED Medications  acetaminophen (TYLENOL) tablet 650 mg (650 mg Oral Given 08/22/18 1437)     Initial Impression / Assessment and Plan / ED Course  I have reviewed the triage vital signs and the nursing notes.  Pertinent labs & imaging results that were available during my care of the patient were reviewed by me and considered in my medical decision making (see chart for details).     22 y.o. female who is G3P1 and is currently 8 weeks by date with the last  menstrual cycle on 06/22/2018 who presents emergency department today for abdominal pain and vaginal bleeding.  No preceding fetal care.  Vital signs are reassuring on presentation.  Pelvic exam, cervical loss is closed and without gross bleeding. No CMT. No concern for PID.  hCG is positive.  ABO Rh is positive.  Patient is not a candidate for RhoGam.  Will obtain blood work.  Will obtain ultrasound to rule out ectopic.  With blood work and [place] pending. Case signed out to [employer], PA-C with disposition pending and treatment as appropriate.   Final Clinical Impressions(s) / ED Diagnoses   Final diagnoses:  Vaginal bleeding    ED Discharge Orders    None       Princella Pellegrini 08/22/18 1542    Donnetta Hutching, MD 08/23/18 [phone]

## 2018-08-22 NOTE — ED Notes (Signed)
Pt in US

## 2018-08-23 ENCOUNTER — Other Ambulatory Visit: Payer: Self-pay

## 2018-08-23 ENCOUNTER — Inpatient Hospital Stay (HOSPITAL_COMMUNITY)
Admission: AD | Admit: 2018-08-23 | Discharge: 2018-08-23 | Disposition: A | Discharge status: Home / Self Care | Payer: Medicaid Other | Source: Ambulatory Visit | Attending: Family Medicine | Admitting: Family Medicine

## 2018-08-23 ENCOUNTER — Encounter: Payer: Self-pay | Admitting: Advanced Practice Midwife

## 2018-08-23 DIAGNOSIS — O30001 Twin pregnancy, unspecified number of placenta and unspecified number of amniotic sacs, first trimester: Secondary | ICD-10-CM | POA: Diagnosis not present

## 2018-08-23 DIAGNOSIS — Z87891 Personal history of nicotine dependence: Secondary | ICD-10-CM | POA: Diagnosis not present

## 2018-08-23 DIAGNOSIS — O209 Hemorrhage in early pregnancy, unspecified: Secondary | ICD-10-CM | POA: Diagnosis not present

## 2018-08-23 DIAGNOSIS — O26851 Spotting complicating pregnancy, first trimester: Secondary | ICD-10-CM | POA: Diagnosis not present

## 2018-08-23 DIAGNOSIS — O210 Mild hyperemesis gravidarum: Secondary | ICD-10-CM | POA: Insufficient documentation

## 2018-08-23 DIAGNOSIS — O219 Vomiting of pregnancy, unspecified: Secondary | ICD-10-CM

## 2018-08-23 DIAGNOSIS — Z3A12 12 weeks gestation of pregnancy: Secondary | ICD-10-CM | POA: Diagnosis not present

## 2018-08-23 HISTORY — DX: Other specified health status: Z78.9

## 2018-08-23 LAB — URINE CULTURE: CULTURE: NO GROWTH

## 2018-08-23 LAB — RPR: RPR: NONREACTIVE

## 2018-08-23 LAB — HIV ANTIBODY (ROUTINE TESTING W REFLEX): HIV SCREEN 4TH GENERATION: NONREACTIVE

## 2018-08-23 MED ORDER — IBUPROFEN 800 MG PO TABS
800.0000 mg | ORAL_TABLET | Freq: Once | ORAL | Status: AC
  Administered 2018-08-23: 800 mg via ORAL
  Filled 2018-08-23: qty 1

## 2018-08-23 MED ORDER — ONDANSETRON 8 MG PO TBDP: 8.0000 mg | ORAL_TABLET | Freq: Three times a day (TID) | ORAL | 0 refills | Status: DC | PRN

## 2018-08-23 NOTE — MAU Provider Note (Signed)
History     CSN: [id number]  Arrival date and time: 08/23/18 0906   First Provider Initiated Contact with Patient 08/23/18 [phone]      Chief Complaint  Patient presents with  . Abdominal Pain  . Vaginal Bleeding   Phyllis Alexander is a 22 y.o. G3P1011 at [redacted]w[redacted]d who presents today with cramping and spotting. She was seen at [facility] on 08/22/18 with this same complaint. She had a complete work-up there. Labs, UA, wet prep were all negative. GC/CT still pending. She had formal US done yesterday that showed viable twin gestation at 12.0 weeks. She states that she wasn't told why she was bleeding yesterday, and it has continued. So she is worried at this time.   Abdominal Pain  Associated symptoms include nausea. Pertinent negatives include no constipation, diarrhea, dysuria, fever, frequency or vomiting.  Vaginal Bleeding  The patient's primary symptoms include pelvic pain and vaginal bleeding. This is a new problem. The current episode started yesterday. The problem occurs intermittently. The problem has been gradually worsening. Pain severity now: 7/10  The problem affects both sides. Associated symptoms include abdominal pain and nausea. Pertinent negatives include no chills, constipation, diarrhea, dysuria, fever, frequency, urgency or vomiting. The vaginal discharge was bloody and brown. The vaginal bleeding is spotting. She has not been passing clots. She has not been passing tissue. Nothing aggravates the symptoms. She has tried nothing for the symptoms. Sexual activity: denies intercourse in the last 48 hours  Her menstrual history has been regular (LMP 06/22/18 ).    OB History    Gravida  3   Para  1   Term  1   Preterm      AB  1   Living  1     SAB  0   TAB  1   Ectopic      Multiple      Live Births  1           Past Medical History:  Diagnosis Date  . Medical history non-contributory     Past Surgical History:  Procedure Laterality Date  . CESAREAN SECTION     . WISDOM TOOTH EXTRACTION      Family History  Problem Relation Age of Onset  . Diabetes Other     Social History   Tobacco Use  . Smoking status: Former Smoker    Packs/day: 0.50    Types: Cigarettes    Last attempt to quit: 06/21/2018    Years since quitting: 0.1  . Smokeless tobacco: Never Used  Substance Use Topics  . Alcohol use: No  . Drug use: Never    Allergies: No Known Allergies  Medications Prior to Admission  Medication Sig Dispense Refill Last Dose  . metroNIDAZOLE (FLAGYL) 250 MG tablet Take 1 tablet (250 mg total) by mouth 3 (three) times daily for 7 days. 21 tablet 0 08/23/2018 at Unknown time  . ondansetron (ZOFRAN) 4 MG tablet Take 1 tablet (4 mg total) by mouth every 6 (six) hours. (Patient not taking: Reported on 06/28/2018) 12 tablet 0 Unknown at Unknown time  . Prenatal Vit-Fe Fumarate-FA (PRENATAL COMPLETE) 14-0.4 MG TABS Take one daily. 60 each 0 Unknown at Unknown time    Review of Systems  Constitutional: Negative for chills and fever.  Gastrointestinal: Positive for abdominal pain and nausea. Negative for constipation, diarrhea and vomiting.  Genitourinary: Positive for pelvic pain and vaginal bleeding. Negative for dysuria, frequency and urgency.   Physical Exam   Blood  pressure 120/67, pulse (!) 103, temperature 98.8 F (37.1 C), temperature source Oral, resp. rate 18, height 5\' 8"  (1.727 m), weight 63.6 kg, last menstrual period 06/22/2018, SpO2 98 %.  Physical Exam  Nursing note and vitals reviewed. Constitutional: She is oriented to person, place, and time. She appears well-developed and well-nourished. No distress.  HENT:  Head: Normocephalic.  Cardiovascular: Normal rate.  Respiratory: Effort normal.  GI: Soft. There is no tenderness. There is no rebound.  Genitourinary:  Genitourinary Comments: Cervix: closed/thick, scant amount of blood noted   Neurological: She is alert and oriented to person, place, and time.  Skin: Skin is  warm and dry.  Psychiatric: She has a normal mood and affect.    Pt informed that the ultrasound is considered a limited OB ultrasound and is not intended to be a complete ultrasound exam.  Patient also informed that the ultrasound is not being completed with the intent of assessing for fetal or placental anomalies or any pelvic abnormalities.  Explained that the purpose of today's ultrasound is to assess for  viability.  Patient acknowledges the purpose of the exam and the limitations of the study.    Viable twin gestation A: FHT 160 B: FHT 162 MAU Course  Procedures  MDM Reviewed formal US results from yesterday, and limited US results findings correlate. Patient reassured. Will given one dose of ibuprofen here today for cramping, and discussed with patient that cramping, growing pains are expected at this time GA.   Assessment and Plan   1. Vaginal bleeding in pregnancy, first trimester   2. [redacted] weeks gestation of pregnancy   3. Twin gestation in first trimester, unspecified multiple gestation type   4. Nausea/vomiting in pregnancy    DC home Comfort measures reviewed  1st/2nd Trimester precautions  Bleeding precautions RX: zofran 8mg  PRN #20  Return to MAU as needed [facility] as soon as possible, list of local providers given      Thressa Sheller 08/23/2018, 9:45 AM

## 2018-08-23 NOTE — MAU Note (Signed)
Pt states lower abd. Cramping started yesterday.  Pt reports she had some spotting yesterday and was seen at [facility] ED.  Pt states vag bleeding has gotten worse since yesterday, but she only sees it when she wipes. Pt rates pain 6/10

## 2018-08-23 NOTE — Discharge Instructions (Signed)
[facility] Prenatal Care Providers °         °[facility]  ° Phone: [phone] ° °[facility] @ [facility]  ° Phone: [phone] ° °[facility]      ° Phone: [phone]   °         °[facility] @ [facility]    ° Phone: [phone] °         °[facility] @ [place]  ° Phone: [phone] ° °[facility] @ [facility] ° Phone: [phone] °    °[facility] ° Phone: [phone] °Safe Medications in Pregnancy  ° °Acne: °Benzoyl Peroxide °Salicylic Acid ° °Backache/Headache: °Tylenol: 2 regular strength every 4 hours OR °             2 Extra strength every 6 hours ° °Colds/Coughs/Allergies: °Benadryl (alcohol free) 25 mg every 6 hours as needed °Breath right strips °Claritin °Cepacol throat lozenges °Chloraseptic throat spray °Cold-Eeze- up to three times per day °Cough drops, alcohol free °Flonase (by prescription only) °Guaifenesin °Mucinex °Robitussin DM (plain only, alcohol free) °Saline nasal spray/drops °Sudafed (pseudoephedrine) & Actifed ** use only after [redacted] weeks gestation and if you do not have high blood pressure °Tylenol °Vicks Vaporub °Zinc lozenges °Zyrtec  ° °Constipation: °Colace °Ducolax suppositories °Fleet enema °Glycerin suppositories °Metamucil °Milk of magnesia °Miralax °Senokot °Smooth move tea ° °Diarrhea: °Kaopectate °Imodium A-D ° °*NO pepto Bismol ° °Hemorrhoids: °Anusol °Anusol HC °Preparation H °Tucks ° °Indigestion: °Tums °Maalox °Mylanta °Zantac  °Pepcid ° °Insomnia: °Benadryl (alcohol free) 25mg every 6 hours as needed °Tylenol PM °Unisom, no Gelcaps ° °Leg Cramps: °Tums °MagGel ° °Nausea/Vomiting:  °Bonine °Dramamine °Emetrol °Ginger extract °Sea bands °Meclizine  °Nausea medication to take during pregnancy:  °Unisom (doxylamine succinate 25 mg tablets) Take one tablet daily at bedtime. If symptoms are not adequately controlled, the dose  can be increased to a maximum recommended dose of two tablets daily (1/2 tablet in the morning, 1/2 tablet mid-afternoon and one at bedtime). °Vitamin B6 100mg tablets. Take one tablet twice a day (up to 200 mg per day). ° °Skin Rashes: °Aveeno products °Benadryl cream or 25mg every 6 hours as needed °Calamine Lotion °1% cortisone cream ° °Yeast infection: °Gyne-lotrimin 7 °Monistat 7 ° ° °**If taking multiple medications, please check labels to avoid duplicating the same active ingredients °**take medication as directed on the label °** Do not exceed 4000 mg of tylenol in 24 hours °**Do not take medications that contain aspirin or ibuprofen ° ° ° ° °

## 2018-08-25 LAB — GC/CHLAMYDIA PROBE AMP (~~LOC~~) NOT AT ARMC
Chlamydia: NEGATIVE
NEISSERIA GONORRHEA: NEGATIVE

## 2018-09-09 ENCOUNTER — Encounter: Payer: Self-pay | Admitting: Obstetrics and Gynecology

## 2018-09-09 ENCOUNTER — Other Ambulatory Visit (HOSPITAL_COMMUNITY)
Admission: RE | Admit: 2018-09-09 | Discharge: 2018-09-09 | Disposition: A | Discharge status: Home / Self Care | Payer: 59 | Source: Ambulatory Visit | Attending: Obstetrics and Gynecology | Admitting: Obstetrics and Gynecology

## 2018-09-09 ENCOUNTER — Other Ambulatory Visit: Payer: Self-pay

## 2018-09-09 ENCOUNTER — Ambulatory Visit (INDEPENDENT_AMBULATORY_CARE_PROVIDER_SITE_OTHER): Payer: 59 | Admitting: Obstetrics and Gynecology

## 2018-09-09 DIAGNOSIS — O30042 Twin pregnancy, dichorionic/diamniotic, second trimester: Secondary | ICD-10-CM

## 2018-09-09 DIAGNOSIS — Z3A14 14 weeks gestation of pregnancy: Secondary | ICD-10-CM | POA: Diagnosis not present

## 2018-09-09 DIAGNOSIS — O34219 Maternal care for unspecified type scar from previous cesarean delivery: Secondary | ICD-10-CM | POA: Diagnosis not present

## 2018-09-09 DIAGNOSIS — Z3480 Encounter for supervision of other normal pregnancy, unspecified trimester: Secondary | ICD-10-CM | POA: Insufficient documentation

## 2018-09-09 DIAGNOSIS — Z3481 Encounter for supervision of other normal pregnancy, first trimester: Secondary | ICD-10-CM | POA: Diagnosis not present

## 2018-09-09 DIAGNOSIS — Z3482 Encounter for supervision of other normal pregnancy, second trimester: Secondary | ICD-10-CM

## 2018-09-09 DIAGNOSIS — O30049 Twin pregnancy, dichorionic/diamniotic, unspecified trimester: Secondary | ICD-10-CM

## 2018-09-09 MED ORDER — VITAFOL ULTRA 29-0.6-0.4-200 MG PO CAPS: 1.0000 | ORAL_CAPSULE | Freq: Every day | ORAL | 12 refills | Status: DC

## 2018-09-09 NOTE — Progress Notes (Signed)
New OB.  Declined FLU. Needs different RX for PNV.  C/o reduced appetite.

## 2018-09-09 NOTE — Progress Notes (Signed)
  Subjective:    Phyllis Alexander is a G3P1011 [redacted]w[redacted]d being seen today for her first obstetrical visit.  Her obstetrical history is significant for di-di twin pregnancy and previous cesarean section (failure to progress at 3 cm). Patient does intend to breast feed. Pregnancy history fully reviewed.  Patient reports nausea and vomiting.  Vitals:   09/09/18 1503  BP: 100/66  Pulse: 100  Temp: (!) 97.1 F (36.2 C)  Weight: 142 lb (64.4 kg)    HISTORY: OB History  Gravida Para Term Preterm AB Living  3 1 1   1 1   SAB TAB Ectopic Multiple Live Births  0 1     1    # Outcome Date GA Lbr Len/2nd Weight Sex Delivery Anes PTL Lv  3 Current           2 TAB           1 Term      CS-LVertical   LIV   Past Medical History:  Diagnosis Date  . Medical history non-contributory    Past Surgical History:  Procedure Laterality Date  . CESAREAN SECTION    . WISDOM TOOTH EXTRACTION     Family History  Problem Relation Age of Onset  . Diabetes Other   . ADD / ADHD Brother   . Hypertension Maternal Aunt   . Arthritis Maternal Grandmother   . Diabetes Maternal Grandmother   . Hypertension Maternal Grandmother   . Stroke Maternal Grandmother   . Arthritis Paternal Grandmother      Exam    Uterus:   16-weeks  Pelvic Exam:    Perineum: No Hemorrhoids, Normal Perineum   Vulva: normal   Vagina:  normal mucosa, normal discharge   pH:    Cervix: multiparous appearance and cervix is closed and long   Adnexa: no mass, fullness, tenderness   Bony Pelvis: gynecoid  System: Breast:  normal appearance, no masses or tenderness   Skin: normal coloration and turgor, no rashes    Neurologic: oriented, no focal deficits   Extremities: normal strength, tone, and muscle mass   HEENT extra ocular movement intact   Mouth/Teeth mucous membranes moist, pharynx normal without lesions and dental hygiene good   Neck supple and no masses   Cardiovascular: regular rate and rhythm   Respiratory:   appears well, vitals normal, no respiratory distress, acyanotic, normal RR, chest clear, no wheezing, crepitations, rhonchi, normal symmetric air entry   Abdomen: soft, non-tender; bowel sounds normal; no masses,  no organomegaly   Urinary:       Assessment:    Pregnancy: G3P1011 Patient Active Problem List   Diagnosis Date Noted  . Supervision of other normal pregnancy, antepartum 09/09/2018  . Dichorionic diamniotic twin pregnancy, antepartum 09/09/2018  . Previous cesarean section complicating pregnancy, antepartum condition or complication 09/09/2018        Plan:     Initial labs drawn. Prenatal vitamins. Problem list reviewed and updated. Genetic Screening discussed : Panorama ordered.  Ultrasound discussed; fetal survey: ordered. Records from cesarean section requested  Follow up in 4 weeks. 50% of 30 min visit spent on counseling and coordination of care.     Phyllis Alexander 09/09/2018

## 2018-09-09 NOTE — Addendum Note (Signed)
Addended by: Marya Landry D on: 09/09/2018 04:34 PM   Modules accepted: Orders

## 2018-09-09 NOTE — Patient Instructions (Addendum)
 Second Trimester of Pregnancy The second trimester is from week 14 through week 27 (months 4 through 6). The second trimester is often a time when you feel your best. Your body has adjusted to being pregnant, and you begin to feel better physically. Usually, morning sickness has lessened or quit completely, you may have more energy, and you may have an increase in appetite. The second trimester is also a time when the fetus is growing rapidly. At the end of the sixth month, the fetus is about 9 inches long and weighs about 1 pounds. You will likely begin to feel the baby move (quickening) between 16 and 20 weeks of pregnancy. Body changes during your second trimester Your body continues to go through many changes during your second trimester. The changes vary from woman to woman.  Your weight will continue to increase. You will notice your lower abdomen bulging out.  You may begin to get stretch marks on your hips, abdomen, and breasts.  You may develop headaches that can be relieved by medicines. The medicines should be approved by your health care provider.  You may urinate more often because the fetus is pressing on your bladder.  You may develop or continue to have heartburn as a result of your pregnancy.  You may develop constipation because certain hormones are causing the muscles that push waste through your intestines to slow down.  You may develop hemorrhoids or swollen, bulging veins (varicose veins).  You may have back pain. This is caused by: ? Weight gain. ? Pregnancy hormones that are relaxing the joints in your pelvis. ? A shift in weight and the muscles that support your balance.  Your breasts will continue to grow and they will continue to become tender.  Your gums may bleed and may be sensitive to brushing and flossing.  Dark spots or blotches (chloasma, mask of pregnancy) may develop on your face. This will likely fade after the baby is born.  A dark line from  your belly button to the pubic area (linea nigra) may appear. This will likely fade after the baby is born.  You may have changes in your hair. These can include thickening of your hair, rapid growth, and changes in texture. Some women also have hair loss during or after pregnancy, or hair that feels dry or thin. Your hair will most likely return to normal after your baby is born.  What to expect at prenatal visits During a routine prenatal visit:  You will be weighed to make sure you and the fetus are growing normally.  Your blood pressure will be taken.  Your abdomen will be measured to track your baby's growth.  The fetal heartbeat will be listened to.  Any test results from the previous visit will be discussed.  Your health care provider may ask you:  How you are feeling.  If you are feeling the baby move.  If you have had any abnormal symptoms, such as leaking fluid, bleeding, severe headaches, or abdominal cramping.  If you are using any tobacco products, including cigarettes, chewing tobacco, and electronic cigarettes.  If you have any questions.  Other tests that may be performed during your second trimester include:  Blood tests that check for: ? Low iron levels (anemia). ? High blood sugar that affects pregnant women (gestational diabetes) between 24 and 28 weeks. ? Rh antibodies. This is to check for a protein on red blood cells (Rh factor).  Urine tests to check for infections, diabetes,   or protein in the urine.  An ultrasound to confirm the proper growth and development of the baby.  An amniocentesis to check for possible genetic problems.  Fetal screens for spina bifida and Down syndrome.  HIV (human immunodeficiency virus) testing. Routine prenatal testing includes screening for HIV, unless you choose not to have this test.  Follow these instructions at home: Medicines  Follow your health care provider's instructions regarding medicine use. Specific  medicines may be either safe or unsafe to take during pregnancy.  Take a prenatal vitamin that contains at least 600 micrograms (mcg) of folic acid.  If you develop constipation, try taking a stool softener if your health care provider approves. Eating and drinking  Eat a balanced diet that includes fresh fruits and vegetables, whole grains, good sources of protein such as meat, eggs, or tofu, and low-fat dairy. Your health care provider will help you determine the amount of weight gain that is right for you.  Avoid raw meat and uncooked cheese. These carry germs that can cause birth defects in the baby.  If you have low calcium intake from food, talk to your health care provider about whether you should take a daily calcium supplement.  Limit foods that are high in fat and processed sugars, such as fried and sweet foods.  To prevent constipation: ? Drink enough fluid to keep your urine clear or pale yellow. ? Eat foods that are high in fiber, such as fresh fruits and vegetables, whole grains, and beans. Activity  Exercise only as directed by your health care provider. Most women can continue their usual exercise routine during pregnancy. Try to exercise for 30 minutes at least 5 days a week. Stop exercising if you experience uterine contractions.  Avoid heavy lifting, wear low heel shoes, and practice good posture.  A sexual relationship may be continued unless your health care provider directs you otherwise. Relieving pain and discomfort  Wear a good support bra to prevent discomfort from breast tenderness.  Take warm sitz baths to soothe any pain or discomfort caused by hemorrhoids. Use hemorrhoid cream if your health care provider approves.  Rest with your legs elevated if you have leg cramps or low back pain.  If you develop varicose veins, wear support hose. Elevate your feet for 15 minutes, 3-4 times a day. Limit salt in your diet. Prenatal Care  Write down your questions.  Take them to your prenatal visits.  Keep all your prenatal visits as told by your health care provider. This is important. Safety  Wear your seat belt at all times when driving.  Make a list of emergency phone numbers, including numbers for family, friends, the hospital, and police and fire departments. General instructions  Ask your health care provider for a referral to a local prenatal education class. Begin classes no later than the beginning of month 6 of your pregnancy.  Ask for help if you have counseling or nutritional needs during pregnancy. Your health care provider can offer advice or refer you to specialists for help with various needs.  Do not use hot tubs, steam rooms, or saunas.  Do not douche or use tampons or scented sanitary pads.  Do not cross your legs for long periods of time.  Avoid cat litter boxes and soil used by cats. These carry germs that can cause birth defects in the baby and possibly loss of the fetus by miscarriage or stillbirth.  Avoid all smoking, herbs, alcohol, and unprescribed drugs. Chemicals in these products   can affect the formation and growth of the baby.  Do not use any products that contain nicotine or tobacco, such as cigarettes and e-cigarettes. If you need help quitting, ask your health care provider.  Visit your dentist if you have not gone yet during your pregnancy. Use a soft toothbrush to brush your teeth and be gentle when you floss. Contact a health care provider if:  You have dizziness.  You have mild pelvic cramps, pelvic pressure, or nagging pain in the abdominal area.  You have persistent nausea, vomiting, or diarrhea.  You have a bad smelling vaginal discharge.  You have pain when you urinate. Get help right away if:  You have a fever.  You are leaking fluid from your vagina.  You have spotting or bleeding from your vagina.  You have severe abdominal cramping or pain.  You have rapid weight gain or weight  loss.  You have shortness of breath with chest pain.  You notice sudden or extreme swelling of your face, hands, ankles, feet, or legs.  You have not felt your baby move in over an hour.  You have severe headaches that do not go away when you take medicine.  You have vision changes. Summary  The second trimester is from week 14 through week 27 (months 4 through 6). It is also a time when the fetus is growing rapidly.  Your body goes through many changes during pregnancy. The changes vary from woman to woman.  Avoid all smoking, herbs, alcohol, and unprescribed drugs. These chemicals affect the formation and growth your baby.  Do not use any tobacco products, such as cigarettes, chewing tobacco, and e-cigarettes. If you need help quitting, ask your health care provider.  Contact your health care provider if you have any questions. Keep all prenatal visits as told by your health care provider. This is important. This information is not intended to replace advice given to you by your health care provider. Make sure you discuss any questions you have with your health care provider. Document Released: 10/23/2001 Document Revised: 12/04/2016 Document Reviewed: 12/04/2016 Elsevier Interactive Patient Education  [employer].  Multiple Pregnancy Having a multiple pregnancy means that a woman is carrying more than one baby at a time. She may be pregnant with twins, triplets, or more. The majority of multiple pregnancies are twins. Naturally conceiving triplets or more (higher-order multiples) is rare. Multiple pregnancies are riskier than single pregnancies. A woman with a multiple pregnancy is more likely to have certain problems during her pregnancy. Therefore, she will need to have more frequent appointments for prenatal care. How does a multiple pregnancy happen? A multiple pregnancy happens when:  The woman's body releases more than one egg at a time, and then each egg gets  fertilized by a different sperm. ? This is the most common type of multiple pregnancy. ? Twins or other multiples produced this way are fraternal. They are no more alike than non-multiple siblings are.  One sperm fertilizes one egg, which then divides into more than one embryo. ? Twins or other multiples produced this way are identical. Identical multiples are always the same gender, and they look very much alike.  Who is most likely to have a multiple pregnancy? A multiple pregnancy is more likely to develop in women who:  Have had fertility treatment, especially if the treatment included fertility drugs.  Are older than 24 years of age.  Have already had four or more children.  Have a family history  of multiple pregnancy.  How is a multiple pregnancy diagnosed? A multiple pregnancy may be diagnosed based on:  Symptoms such as: ? Rapid weight gain in the first 3 months of pregnancy (first trimester). ? More severe nausea and breast tenderness than what is typical of a single pregnancy. ? The uterus measuring larger than what is normal for the stage of the pregnancy.  Blood tests that detect a higher-than-normal level of human chorionic gonadotropin (hCG). This is a hormone that your body produces in early pregnancy.  Ultrasound exam. This is used to confirm that you are carrying multiples.  What risks are associated with multiple pregnancy? A multiple pregnancy puts you at a higher risk for certain problems during or after your pregnancy, including:  Having your babies delivered before you have reached a full-term pregnancy (preterm birth). A full-term pregnancy lasts for at least 37 weeks. Babies born before 14 weeks may have a higher risk of a variety of health problems, such as breathing problems, feeding difficulties, cerebral palsy, and learning disabilities.  Diabetes.  Preeclampsia. This is a serious condition that causes high blood pressure along with other symptoms,  such as swelling and headaches, during pregnancy.  Excessive blood loss after childbirth (postpartum hemorrhage).  Postpartum depression.  Low birth weight of the babies.  How will having a multiple pregnancy affect my care? Your health care provider will want to monitor you more closely during your pregnancy to make sure that your babies are growing normally and that you are healthy. Follow these instructions at home: Because your pregnancy is considered to be high risk, you will need to work closely with your health care team. You may also need to make some lifestyle changes. These may include the following: Eating and drinking  Increase your nutrition. ? Follow your health care provider's recommendations for weight gain. You may need to gain a little extra weight when you are pregnant with multiples. ? Eat healthy snacks often throughout the day. This can add calories and reduce nausea.  Drink enough fluid to keep your urine clear or pale yellow.  Take prenatal vitamins. Activity By 20-24 weeks, you may need to limit your activities.  Avoid activities and work that take a lot of effort (are strenuous).  Ask your health care provider when you should stop having sexual intercourse.  Rest often.  General instructions  Do not use any products that contain nicotine or tobacco, such as cigarettes and e-cigarettes. If you need help quitting, ask your health care provider.  Do not drink alcohol or use illegal drugs.  Take over-the-counter and prescription medicines only as told by your health care provider.  Arrange for extra help around the house.  Keep all follow-up visits and all prenatal visits as told by your health care provider. This is important. Contact a health care provider if:  You have dizziness.  You have persistent nausea, vomiting, or diarrhea.  You are having trouble gaining weight.  You have feelings of depression or other emotions that are interfering  with your normal activities. Get help right away if:  You have a fever.  You have pain with urination.  You have fluid leaking from your vagina.  You have a bad-smelling vaginal discharge.  You notice increased swelling in your face, hands, legs, or ankles.  You have spotting or bleeding from your vagina.  You have pelvic cramps, pelvic pressure, or nagging pain in your abdomen or lower back.  You are having regular contractions.  You develop  a severe headache, with or without visual changes.  You have shortness of breath or chest pain.  You notice less fetal movement, or no fetal movement. This information is not intended to replace advice given to you by your health care provider. Make sure you discuss any questions you have with your health care provider. Document Released: 08/07/2008 Document Revised: 06/29/2016 Document Reviewed: 06/29/2016 Elsevier Interactive Patient Education  2018 [employer].   Contraception Choices Contraception, also called birth control, refers to methods or devices that prevent pregnancy. Hormonal methods Contraceptive implant A contraceptive implant is a thin, plastic tube that contains a hormone. It is inserted into the upper part of the arm. It can remain in place for up to 3 years. Progestin-only injections Progestin-only injections are injections of progestin, a synthetic form of the hormone progesterone. They are given every 3 months by a health care provider. Birth control pills Birth control pills are pills that contain hormones that prevent pregnancy. They must be taken once a day, preferably at the same time each day. Birth control patch The birth control patch contains hormones that prevent pregnancy. It is placed on the skin and must be changed once a week for three weeks and removed on the fourth week. A prescription is needed to use this method of contraception. Vaginal ring A vaginal ring contains hormones that prevent  pregnancy. It is placed in the vagina for three weeks and removed on the fourth week. After that, the process is repeated with a new ring. A prescription is needed to use this method of contraception. Emergency contraceptive Emergency contraceptives prevent pregnancy after unprotected sex. They come in pill form and can be taken up to 5 days after sex. They work best the sooner they are taken after having sex. Most emergency contraceptives are available without a prescription. This method should not be used as your only form of birth control. Barrier methods Female condom A female condom is a thin sheath that is worn over the penis during sex. Condoms keep sperm from going inside a woman's body. They can be used with a spermicide to increase their effectiveness. They should be disposed after a single use. Female condom A female condom is a soft, loose-fitting sheath that is put into the vagina before sex. The condom keeps sperm from going inside a woman's body. They should be disposed after a single use. Diaphragm A diaphragm is a soft, dome-shaped barrier. It is inserted into the vagina before sex, along with a spermicide. The diaphragm blocks sperm from entering the uterus, and the spermicide kills sperm. A diaphragm should be left in the vagina for 6-8 hours after sex and removed within 24 hours. A diaphragm is prescribed and fitted by a health care provider. A diaphragm should be replaced every 1-2 years, after giving birth, after gaining more than 15 lb (6.8 kg), and after pelvic surgery. Cervical cap A cervical cap is a round, soft latex or plastic cup that fits over the cervix. It is inserted into the vagina before sex, along with spermicide. It blocks sperm from entering the uterus. The cap should be left in place for 6-8 hours after sex and removed within 48 hours. A cervical cap must be prescribed and fitted by a health care provider. It should be replaced every 2 years. Sponge A sponge is a  soft, circular piece of polyurethane foam with spermicide on it. The sponge helps block sperm from entering the uterus, and the spermicide kills sperm. To use it,  you make it wet and then insert it into the vagina. It should be inserted before sex, left in for at least 6 hours after sex, and removed and thrown away within 30 hours. Spermicides Spermicides are chemicals that kill or block sperm from entering the cervix and uterus. They can come as a cream, jelly, suppository, foam, or tablet. A spermicide should be inserted into the vagina with an applicator at least 73-41 minutes before sex to allow time for it to work. The process must be repeated every time you have sex. Spermicides do not require a prescription. Intrauterine contraception Intrauterine device (IUD) An IUD is a T-shaped device that is put in a woman's uterus. There are two types:  Hormone IUD.This type contains progestin, a synthetic form of the hormone progesterone. This type can stay in place for 3-5 years.  Copper IUD.This type is wrapped in copper wire. It can stay in place for 10 years.  Permanent methods of contraception Female tubal ligation In this method, a woman's fallopian tubes are sealed, tied, or blocked during surgery to prevent eggs from traveling to the uterus. Hysteroscopic sterilization In this method, a small, flexible insert is placed into each fallopian tube. The inserts cause scar tissue to form in the fallopian tubes and block them, so sperm cannot reach an egg. The procedure takes about 3 months to be effective. Another form of birth control must be used during those 3 months. Female sterilization This is a procedure to tie off the tubes that carry sperm (vasectomy). After the procedure, the man can still ejaculate fluid (semen). Natural planning methods Natural family planning In this method, a couple does not have sex on days when the woman could become pregnant. Calendar method This means keeping  track of the length of each menstrual cycle, identifying the days when pregnancy can happen, and not having sex on those days. Ovulation method In this method, a couple avoids sex during ovulation. Symptothermal method This method involves not having sex during ovulation. The woman typically checks for ovulation by watching changes in her temperature and in the consistency of cervical mucus. Post-ovulation method In this method, a couple waits to have sex until after ovulation. Summary  Contraception, also called birth control, means methods or devices that prevent pregnancy.  Hormonal methods of contraception include implants, injections, pills, patches, vaginal rings, and emergency contraceptives.  Barrier methods of contraception can include female condoms, female condoms, diaphragms, cervical caps, sponges, and spermicides.  There are two types of IUDs (intrauterine devices). An IUD can be put in a woman's uterus to prevent pregnancy for 3-5 years.  Permanent sterilization can be done through a procedure for males, females, or both.  Natural family planning methods involve not having sex on days when the woman could become pregnant. This information is not intended to replace advice given to you by your health care provider. Make sure you discuss any questions you have with your health care provider. Document Released: 10/29/2005 Document Revised: 12/01/2016 Document Reviewed: 12/01/2016 Elsevier Interactive Patient Education  2018 [employer].   Breastfeeding Choosing to breastfeed is one of the best decisions you can make for yourself and your baby. A change in hormones during pregnancy causes your breasts to make breast milk in your milk-producing glands. Hormones prevent breast milk from being released before your baby is born. They also prompt milk flow after birth. Once breastfeeding has begun, thoughts of your baby, as well as his or her sucking or crying, can stimulate the  release of milk from your milk-producing glands. Benefits of breastfeeding Research shows that breastfeeding offers many health benefits for infants and mothers. It also offers a cost-free and convenient way to feed your baby. For your baby  Your first milk (colostrum) helps your baby's digestive system to function better.  Special cells in your milk (antibodies) help your baby to fight off infections.  Breastfed babies are less likely to develop asthma, allergies, obesity, or type 2 diabetes. They are also at lower risk for sudden infant death syndrome (SIDS).  Nutrients in breast milk are better able to meet your baby's needs compared to infant formula.  Breast milk improves your baby's brain development. For you  Breastfeeding helps to create a very special bond between you and your baby.  Breastfeeding is convenient. Breast milk costs nothing and is always available at the correct temperature.  Breastfeeding helps to burn calories. It helps you to lose the weight that you gained during pregnancy.  Breastfeeding makes your uterus return faster to its size before pregnancy. It also slows bleeding (lochia) after you give birth.  Breastfeeding helps to lower your risk of developing type 2 diabetes, osteoporosis, rheumatoid arthritis, cardiovascular disease, and breast, ovarian, uterine, and endometrial cancer later in life. Breastfeeding basics Starting breastfeeding  Find a comfortable place to sit or lie down, with your neck and back well-supported.  Place a pillow or a rolled-up blanket under your baby to bring him or her to the level of your breast (if you are seated). Nursing pillows are specially designed to help support your arms and your baby while you breastfeed.  Make sure that your baby's tummy (abdomen) is facing your abdomen.  Gently massage your breast. With your fingertips, massage from the outer edges of your breast inward toward the nipple. This encourages milk  flow. If your milk flows slowly, you may need to continue this action during the feeding.  Support your breast with 4 fingers underneath and your thumb above your nipple (make the letter "C" with your hand). Make sure your fingers are well away from your nipple and your baby's mouth.  Stroke your baby's lips gently with your finger or nipple.  When your baby's mouth is open wide enough, quickly bring your baby to your breast, placing your entire nipple and as much of the areola as possible into your baby's mouth. The areola is the colored area around your nipple. ? More areola should be visible above your baby's upper lip than below the lower lip. ? Your baby's lips should be opened and extended outward (flanged) to ensure an adequate, comfortable latch. ? Your baby's tongue should be between his or her lower gum and your breast.  Make sure that your baby's mouth is correctly positioned around your nipple (latched). Your baby's lips should create a seal on your breast and be turned out (everted).  It is common for your baby to suck about 2-3 minutes in order to start the flow of breast milk. Latching Teaching your baby how to latch onto your breast properly is very important. An improper latch can cause nipple pain, decreased milk supply, and poor weight gain in your baby. Also, if your baby is not latched onto your nipple properly, he or she may swallow some air during feeding. This can make your baby fussy. Burping your baby when you switch breasts during the feeding can help to get rid of the air. However, teaching your baby to latch on properly is still the best  way to prevent fussiness from swallowing air while breastfeeding. Signs that your baby has successfully latched onto your nipple  Silent tugging or silent sucking, without causing you pain. Infant's lips should be extended outward (flanged).  Swallowing heard between every 3-4 sucks once your milk has started to flow (after your  let-down milk reflex occurs).  Muscle movement above and in front of his or her ears while sucking.  Signs that your baby has not successfully latched onto your nipple  Sucking sounds or smacking sounds from your baby while breastfeeding.  Nipple pain.  If you think your baby has not latched on correctly, slip your finger into the corner of your baby's mouth to break the suction and place it between your baby's gums. Attempt to start breastfeeding again. Signs of successful breastfeeding Signs from your baby  Your baby will gradually decrease the number of sucks or will completely stop sucking.  Your baby will fall asleep.  Your baby's body will relax.  Your baby will retain a small amount of milk in his or her mouth.  Your baby will let go of your breast by himself or herself.  Signs from you  Breasts that have increased in firmness, weight, and size 1-3 hours after feeding.  Breasts that are softer immediately after breastfeeding.  Increased milk volume, as well as a change in milk consistency and color by the fifth day of breastfeeding.  Nipples that are not sore, cracked, or bleeding.  Signs that your baby is getting enough milk  Wetting at least 1-2 diapers during the first 24 hours after birth.  Wetting at least 5-6 diapers every 24 hours for the first week after birth. The urine should be clear or pale yellow by the age of 5 days.  Wetting 6-8 diapers every 24 hours as your baby continues to grow and develop.  At least 3 stools in a 24-hour period by the age of 5 days. The stool should be soft and yellow.  At least 3 stools in a 24-hour period by the age of 7 days. The stool should be seedy and yellow.  No loss of weight greater than 10% of birth weight during the first 3 days of life.  Average weight gain of 4-7 oz (113-198 g) per week after the age of 4 days.  Consistent daily weight gain by the age of 5 days, without weight loss after the age of 2  weeks. After a feeding, your baby may spit up a small amount of milk. This is normal. Breastfeeding frequency and duration Frequent feeding will help you make more milk and can prevent sore nipples and extremely full breasts (breast engorgement). Breastfeed when you feel the need to reduce the fullness of your breasts or when your baby shows signs of hunger. This is called "breastfeeding on demand." Signs that your baby is hungry include:  Increased alertness, activity, or restlessness.  Movement of the head from side to side.  Opening of the mouth when the corner of the mouth or cheek is stroked (rooting).  Increased sucking sounds, smacking lips, cooing, sighing, or squeaking.  Hand-to-mouth movements and sucking on fingers or hands.  Fussing or crying.  Avoid introducing a pacifier to your baby in the first 4-6 weeks after your baby is born. After this time, you may choose to use a pacifier. Research has shown that pacifier use during the first year of a baby's life decreases the risk of sudden infant death syndrome (SIDS). Allow your baby to  feed on each breast as long as he or she wants. When your baby unlatches or falls asleep while feeding from the first breast, offer the second breast. Because newborns are often sleepy in the first few weeks of life, you may need to awaken your baby to get him or her to feed. Breastfeeding times will vary from baby to baby. However, the following rules can serve as a guide to help you make sure that your baby is properly fed:  Newborns (babies 101 weeks of age or younger) may breastfeed every 1-3 hours.  Newborns should not go without breastfeeding for longer than 3 hours during the day or 5 hours during the night.  You should breastfeed your baby a minimum of 8 times in a 24-hour period.  Breast milk pumping Pumping and storing breast milk allows you to make sure that your baby is exclusively fed your breast milk, even at times when you are unable  to breastfeed. This is especially important if you go back to work while you are still breastfeeding, or if you are not able to be present during feedings. Your lactation consultant can help you find a method of pumping that works best for you and give you guidelines about how long it is safe to store breast milk. Caring for your breasts while you breastfeed Nipples can become dry, cracked, and sore while breastfeeding. The following recommendations can help keep your breasts moisturized and healthy:  Avoid using soap on your nipples.  Wear a supportive bra designed especially for nursing. Avoid wearing underwire-style bras or extremely tight bras (sports bras).  Air-dry your nipples for 3-4 minutes after each feeding.  Use only cotton bra pads to absorb leaked breast milk. Leaking of breast milk between feedings is normal.  Use lanolin on your nipples after breastfeeding. Lanolin helps to maintain your skin's normal moisture barrier. Pure lanolin is not harmful (not toxic) to your baby. You may also hand express a few drops of breast milk and gently massage that milk into your nipples and allow the milk to air-dry.  In the first few weeks after giving birth, some women experience breast engorgement. Engorgement can make your breasts feel heavy, warm, and tender to the touch. Engorgement peaks within 3-5 days after you give birth. The following recommendations can help to ease engorgement:  Completely empty your breasts while breastfeeding or pumping. You may want to start by applying warm, moist heat (in the shower or with warm, water-soaked hand towels) just before feeding or pumping. This increases circulation and helps the milk flow. If your baby does not completely empty your breasts while breastfeeding, pump any extra milk after he or she is finished.  Apply ice packs to your breasts immediately after breastfeeding or pumping, unless this is too uncomfortable for you. To do this: ? Put ice  in a plastic bag. ? Place a towel between your skin and the bag. ? Leave the ice on for 20 minutes, 2-3 times a day.  Make sure that your baby is latched on and positioned properly while breastfeeding.  If engorgement persists after 48 hours of following these recommendations, contact your health care provider or a Science writer. Overall health care recommendations while breastfeeding  Eat 3 healthy meals and 3 snacks every day. Well-nourished mothers who are breastfeeding need an additional 450-500 calories a day. You can meet this requirement by increasing the amount of a balanced diet that you eat.  Drink enough water to keep your urine pale  yellow or clear.  Rest often, relax, and continue to take your prenatal vitamins to prevent fatigue, stress, and low vitamin and mineral levels in your body (nutrient deficiencies).  Do not use any products that contain nicotine or tobacco, such as cigarettes and e-cigarettes. Your baby may be harmed by chemicals from cigarettes that pass into breast milk and exposure to secondhand smoke. If you need help quitting, ask your health care provider.  Avoid alcohol.  Do not use illegal drugs or marijuana.  Talk with your health care provider before taking any medicines. These include over-the-counter and prescription medicines as well as vitamins and herbal supplements. Some medicines that may be harmful to your baby can pass through breast milk.  It is possible to become pregnant while breastfeeding. If birth control is desired, ask your health care provider about options that will be safe while breastfeeding your baby. Where to find more information: [employer]: www.llli.org Contact a health care provider if:  You feel like you want to stop breastfeeding or have become frustrated with breastfeeding.  Your nipples are cracked or bleeding.  Your breasts are red, tender, or warm.  You have: ? Painful breasts or  nipples. ? A swollen area on either breast. ? A fever or chills. ? Nausea or vomiting. ? Drainage other than breast milk from your nipples.  Your breasts do not become full before feedings by the fifth day after you give birth.  You feel sad and depressed.  Your baby is: ? Too sleepy to eat well. ? Having trouble sleeping. ? More than 50 week old and wetting fewer than 6 diapers in a 24-hour period. ? Not gaining weight by 25 days of age.  Your baby has fewer than 3 stools in a 24-hour period.  Your baby's skin or the white parts of his or her eyes become yellow. Get help right away if:  Your baby is overly tired (lethargic) and does not want to wake up and feed.  Your baby develops an unexplained fever. Summary  Breastfeeding offers many health benefits for infant and mothers.  Try to breastfeed your infant when he or she shows early signs of hunger.  Gently tickle or stroke your baby's lips with your finger or nipple to allow the baby to open his or her mouth. Bring the baby to your breast. Make sure that much of the areola is in your baby's mouth. Offer one side and burp the baby before you offer the other side.  Talk with your health care provider or lactation consultant if you have questions or you face problems as you breastfeed. This information is not intended to replace advice given to you by your health care provider. Make sure you discuss any questions you have with your health care provider. Document Released: 10/29/2005 Document Revised: 11/30/2016 Document Reviewed: 11/30/2016 Elsevier Interactive Patient Education  2018 [employer].  Vaginal Birth After Cesarean Delivery Vaginal birth after cesarean delivery (VBAC) is giving birth vaginally after previously delivering a baby by a cesarean. In the past, if a woman had a cesarean delivery, all births afterward would be done by cesarean delivery. This is no longer true. It can be safe for the mother to try a vaginal  delivery after having a cesarean delivery. It is important to discuss VBAC with your health care provider early in the pregnancy so you can understand the risks, benefits, and options. It will give you time to decide what is best in your particular case.  The final decision about whether to have a VBAC or repeat cesarean delivery should be between you and your health care provider. Any changes in your health or your baby's health during your pregnancy may make it necessary to change your initial decision about VBAC. Women who plan to have a VBAC should check with their health care provider to be sure that:  The previous cesarean delivery was done with a low transverse uterine cut (incision) (not a vertical classical incision).  The birth canal is big enough for the baby.  There were no other operations on the uterus.  An electronic fetal monitor (EFM) will be on at all times during labor.  An operating room will be available and ready in case an emergency cesarean delivery is needed.  A health care provider and surgical nursing staff will be available at all times during labor to be ready to do an emergency delivery cesarean if necessary.  An anesthesiologist will be present in case an emergency cesarean delivery is needed.  The nursery is prepared and has adequate personnel and necessary equipment available to care for the baby in case of an emergency cesarean delivery. Benefits of VBAC  Shorter stay in the hospital.  Avoidance of risks associated with cesarean delivery, such as: ? Surgical complications, such as opening of the incision or hernia in the incision. ? Injury to other organs. ? Fever. This can occur if an infection develops after surgery. It can also occur as a reaction to the medicine given to make you numb during the surgery.  Less blood loss and need for blood transfusions.  Lower risk of blood clots and infection.  Shorter recovery.  Decreased risk for having to  remove the uterus (hysterectomy).  Decreased risk for the placenta to completely or partially cover the opening of the uterus (placenta previa) with a future pregnancy.  Decrease risk in future labor and delivery. Risks of a VBAC  Tearing (rupture) of the uterus. This is occurs in less than 1% of VBACs. The risk of this happening is higher if: ? Steps are taken to begin the labor process (induce labor) or stimulate or strengthen contractions (augment labor). ? Medicine is used to soften (ripen) the cervix.  Having to remove the uterus (hysterectomy) if it ruptures. VBAC should not be done if:  The previous cesarean delivery was done with a vertical (classical) or T-shaped incision or you do not know what kind of incision was made.  You had a ruptured uterus.  You have had certain types of surgery on your uterus, such as removal of uterine fibroids. Ask your health care provider about other types of surgeries that prevent you from having a VBAC.  You have certain medical or childbirth (obstetrical) problems.  There are problems with the baby.  You have had two previous cesarean deliveries and no vaginal deliveries. Other facts to know about VBAC:  It is safe to have an epidural anesthetic with VBAC.  It is safe to turn the baby from a breech position (attempt an external cephalic version).  It is safe to try a VBAC with twins.  VBAC may not be successful if your baby weights 8.8 lb (4 kg) or more. However, weight predictions are not always accurate and should not be used alone to decide if VBAC is right for you.  There is an increased failure rate if the time between the cesarean delivery and VBAC is less than 19 months.  Your health care provider may advise against  a VBAC if you have preeclampsia (high blood pressure, protein in the urine, and swelling of face and extremities).  VBAC is often successful if you previously gave birth vaginally.  VBAC is often successful when  the labor starts spontaneously before the due date.  Delivering a baby through a VBAC is similar to having a normal spontaneous vaginal delivery. This information is not intended to replace advice given to you by your health care provider. Make sure you discuss any questions you have with your health care provider. Document Released: 04/21/2007 Document Revised: 04/05/2016 Document Reviewed: 05/28/2013 Elsevier Interactive Patient Education  [employer].

## 2018-09-12 LAB — CULTURE, OB URINE

## 2018-09-12 LAB — OBSTETRIC PANEL, INCLUDING HIV
Antibody Screen: NEGATIVE
BASOS ABS: 0 10*3/uL (ref 0.0–0.2)
Basos: 0 %
EOS (ABSOLUTE): 0 10*3/uL (ref 0.0–0.4)
Eos: 0 %
HEP B S AG: NEGATIVE
HIV SCREEN 4TH GENERATION: NONREACTIVE
Hematocrit: 32.9 % — ABNORMAL LOW (ref 34.0–46.6)
Hemoglobin: 11.3 g/dL (ref 11.1–15.9)
IMMATURE GRANULOCYTES: 0 %
Immature Grans (Abs): 0 10*3/uL (ref 0.0–0.1)
LYMPHS ABS: 1.5 10*3/uL (ref 0.7–3.1)
Lymphs: 12 %
MCH: 30.1 pg (ref 26.6–33.0)
MCHC: 34.3 g/dL (ref 31.5–35.7)
MCV: 88 fL (ref 79–97)
MONOS ABS: 0.7 10*3/uL (ref 0.1–0.9)
Monocytes: 6 %
NEUTROS PCT: 82 %
Neutrophils Absolute: 9.7 10*3/uL — ABNORMAL HIGH (ref 1.4–7.0)
PLATELETS: 308 10*3/uL (ref 150–450)
RBC: 3.75 x10E6/uL — AB (ref 3.77–5.28)
RDW: 13 % (ref 12.3–15.4)
RPR Ser Ql: NONREACTIVE
Rh Factor: POSITIVE
Rubella Antibodies, IGG: 3.91 index (ref >0.99)
WBC: 12 10*3/uL — AB (ref 3.4–10.8)

## 2018-09-12 LAB — URINE CULTURE, OB REFLEX

## 2018-09-12 LAB — HEMOGLOBINOPATHY EVALUATION
HGB C: 0 %
HGB S: 0 %
HGB VARIANT: 0 %
Hemoglobin A2 Quantitation: 2.4 % (ref 1.8–3.2)
Hemoglobin F Quantitation: 0 % (ref 0.0–2.0)
Hgb A: 97.6 % (ref 96.4–98.8)

## 2018-09-12 LAB — CYTOLOGY - PAP: DIAGNOSIS: NEGATIVE

## 2018-09-15 ENCOUNTER — Inpatient Hospital Stay (HOSPITAL_COMMUNITY)
Admission: AD | Admit: 2018-09-15 | Discharge: 2018-09-15 | Disposition: A | Discharge status: Home / Self Care | Payer: 59 | Source: Ambulatory Visit | Attending: Obstetrics and Gynecology | Admitting: Obstetrics and Gynecology

## 2018-09-15 ENCOUNTER — Inpatient Hospital Stay (HOSPITAL_BASED_OUTPATIENT_CLINIC_OR_DEPARTMENT_OTHER): Payer: 59

## 2018-09-15 ENCOUNTER — Encounter (HOSPITAL_COMMUNITY): Payer: Self-pay | Admitting: *Deleted

## 2018-09-15 DIAGNOSIS — O34219 Maternal care for unspecified type scar from previous cesarean delivery: Secondary | ICD-10-CM

## 2018-09-15 DIAGNOSIS — O30042 Twin pregnancy, dichorionic/diamniotic, second trimester: Secondary | ICD-10-CM | POA: Insufficient documentation

## 2018-09-15 DIAGNOSIS — O30049 Twin pregnancy, dichorionic/diamniotic, unspecified trimester: Secondary | ICD-10-CM

## 2018-09-15 DIAGNOSIS — Z87891 Personal history of nicotine dependence: Secondary | ICD-10-CM | POA: Insufficient documentation

## 2018-09-15 DIAGNOSIS — O4692 Antepartum hemorrhage, unspecified, second trimester: Secondary | ICD-10-CM

## 2018-09-15 DIAGNOSIS — Z3A15 15 weeks gestation of pregnancy: Secondary | ICD-10-CM

## 2018-09-15 DIAGNOSIS — O209 Hemorrhage in early pregnancy, unspecified: Secondary | ICD-10-CM | POA: Diagnosis present

## 2018-09-15 DIAGNOSIS — Z3480 Encounter for supervision of other normal pregnancy, unspecified trimester: Secondary | ICD-10-CM

## 2018-09-15 LAB — WET PREP, GENITAL
Clue Cells Wet Prep HPF POC: NONE SEEN
SPERM: NONE SEEN
TRICH WET PREP: NONE SEEN
Yeast Wet Prep HPF POC: NONE SEEN

## 2018-09-15 LAB — URINALYSIS, ROUTINE W REFLEX MICROSCOPIC
Bilirubin Urine: NEGATIVE
Glucose, UA: NEGATIVE mg/dL
Hgb urine dipstick: NEGATIVE
KETONES UR: 20 mg/dL — AB
Nitrite: NEGATIVE
PH: 6 (ref 5.0–8.0)
Protein, ur: NEGATIVE mg/dL
Specific Gravity, Urine: 1.017 (ref 1.005–1.030)

## 2018-09-15 NOTE — MAU Note (Signed)
Patient presents to MAU with right lower abdominal cramping and vaginal bleeding on and off for past few weeks.  She was seen in the ED a few weeks ago for bleeding and was told there was a "blood clot or something."  Patient had first OB appointment at [facility] on 10/29 and reports the bleeding had stopped, but now it is starting again today.  Soaked through a panty liner to her pants today.

## 2018-09-15 NOTE — MAU Provider Note (Addendum)
History     CSN: [id number]  Arrival date and time: 09/15/18 1505   First Provider Initiated Contact with Patient 09/15/18 1622      Chief Complaint  Patient presents with  . Abdominal Pain  . Vaginal Bleeding   HPI   [name] is a 22 y.o. female G3P1011 @ [redacted]w[redacted]d here in MAU with vaginal bleeding. This is the second episode of bleeding in this pregnancy. This episode began last Thursday and has progressed. She describes the bleeding as a slow trickle. She has some mild lower abdominal cramping that comes and goes. No recent intercourse.   OB History    Gravida  3   Para  1   Term  1   Preterm      AB  1   Living  1     SAB  0   TAB  1   Ectopic      Multiple      Live Births  1           Past Medical History:  Diagnosis Date  . Medical history non-contributory     Past Surgical History:  Procedure Laterality Date  . CESAREAN SECTION    . DILATION AND CURETTAGE OF UTERUS    . WISDOM TOOTH EXTRACTION      Family History  Problem Relation Age of Onset  . Diabetes Other   . ADD / ADHD Brother   . Hypertension Maternal Aunt   . Arthritis Maternal Grandmother   . Diabetes Maternal Grandmother   . Hypertension Maternal Grandmother   . Stroke Maternal Grandmother   . Arthritis Paternal Grandmother     Social History   Tobacco Use  . Smoking status: Former Smoker    Packs/day: 0.50    Types: Cigarettes    Last attempt to quit: 08/15/2018    Years since quitting: 0.0  . Smokeless tobacco: Never Used  Substance Use Topics  . Alcohol use: No  . Drug use: Never    Allergies: No Known Allergies  Medications Prior to Admission  Medication Sig Dispense Refill Last Dose  . ondansetron (ZOFRAN ODT) 8 MG disintegrating tablet Take 1 tablet (8 mg total) by mouth every 8 (eight) hours as needed for nausea or vomiting. 20 tablet 0 09/14/2018 at Unknown time  . Prenat-Fe Poly-Methfol-FA-DHA (VITAFOL ULTRA) 29-0.6-0.4-200 MG CAPS Take 1 tablet  by mouth daily. (Patient not taking: Reported on 09/15/2018) 30 capsule 12 Not Taking at Unknown time  . Prenatal Vit-Fe Fumarate-FA (PRENATAL COMPLETE) 14-0.4 MG TABS Take one daily. 60 each 0 Unknown at Unknown time   Results for orders placed or performed during the hospital encounter of 09/15/18 (from the past 48 hour(s))  Urinalysis, Routine w reflex microscopic     Status: Abnormal   Collection Time: 09/15/18  3:28 PM  Result Value Ref Range   Color, Urine YELLOW YELLOW   APPearance CLOUDY (A) CLEAR   Specific Gravity, Urine 1.017 1.005 - 1.030   pH 6.0 5.0 - 8.0   Glucose, UA NEGATIVE NEGATIVE mg/dL   Hgb urine dipstick NEGATIVE NEGATIVE   Bilirubin Urine NEGATIVE NEGATIVE   Ketones, ur 20 (A) NEGATIVE mg/dL   Protein, ur NEGATIVE NEGATIVE mg/dL   Nitrite NEGATIVE NEGATIVE   Leukocytes, UA TRACE (A) NEGATIVE   RBC / HPF 0-5 0 - 5 RBC/hpf   WBC, UA 0-5 0 - 5 WBC/hpf   Bacteria, UA RARE (A) NONE SEEN   Squamous Epithelial / LPF 0-5 0 -  5   Mucus PRESENT    Amorphous Crystal PRESENT     Comment: Performed at [facility], [address] [place], [place] [address]  Wet prep, genital     Status: Abnormal   Collection Time: 09/15/18  4:45 PM  Result Value Ref Range   Yeast Wet Prep HPF POC NONE SEEN NONE SEEN   Trich, Wet Prep NONE SEEN NONE SEEN   Clue Cells Wet Prep HPF POC NONE SEEN NONE SEEN   WBC, Wet Prep HPF POC MANY (A) NONE SEEN   Sperm NONE SEEN     Comment: Performed at [facility], [address] [place], [place] [address]    Review of Systems  Gastrointestinal: Negative for constipation, nausea and vomiting.  Genitourinary: Positive for vaginal bleeding.   Physical Exam   Blood pressure 113/62, pulse 89, temperature 98.1 F (36.7 C), temperature source Oral, resp. rate 16, weight 63.6 kg, last menstrual period 06/22/2018.  Physical Exam  Constitutional: She is oriented to person, place, and time. She appears well-developed and well-nourished. No  distress.  HENT:  Head: Normocephalic.  GI: Soft. She exhibits no distension. There is no tenderness. There is no rebound and no guarding.  Genitourinary:  Genitourinary Comments: Vagina - Small amount of pink mucoid vaginal discharge, no odor  Cervix - No contact bleeding, no active bleeding  Bimanual exam: Cervix FT, Thick  GC/Chlam, wet prep done Chaperone present for exam.   Musculoskeletal: Normal range of motion.  Neurological: She is alert and oriented to person, place, and time.  Skin: She is not diaphoretic.    Results for orders placed or performed during the hospital encounter of 09/15/18 (from the past 24 hour(s))  Urinalysis, Routine w reflex microscopic     Status: Abnormal   Collection Time: 09/15/18  3:28 PM  Result Value Ref Range   Color, Urine YELLOW YELLOW   APPearance CLOUDY (A) CLEAR   Specific Gravity, Urine 1.017 1.005 - 1.030   pH 6.0 5.0 - 8.0   Glucose, UA NEGATIVE NEGATIVE mg/dL   Hgb urine dipstick NEGATIVE NEGATIVE   Bilirubin Urine NEGATIVE NEGATIVE   Ketones, ur 20 (A) NEGATIVE mg/dL   Protein, ur NEGATIVE NEGATIVE mg/dL   Nitrite NEGATIVE NEGATIVE   Leukocytes, UA TRACE (A) NEGATIVE   RBC / HPF 0-5 0 - 5 RBC/hpf   WBC, UA 0-5 0 - 5 WBC/hpf   Bacteria, UA RARE (A) NONE SEEN   Squamous Epithelial / LPF 0-5 0 - 5   Mucus PRESENT    Amorphous Crystal PRESENT   Wet prep, genital     Status: Abnormal   Collection Time: 09/15/18  4:45 PM  Result Value Ref Range   Yeast Wet Prep HPF POC NONE SEEN NONE SEEN   Trich, Wet Prep NONE SEEN NONE SEEN   Clue Cells Wet Prep HPF POC NONE SEEN NONE SEEN   WBC, Wet Prep HPF POC MANY (A) NONE SEEN   Sperm NONE SEEN    Prelim U/S results :  [redacted]w[redacted]d twin Dichorionic gestation-viable Twin A viable with HR 161, posterior placenta;  Twin B HR 165 Posterior Placenta.  Cervix is closed.   MAU Course  Procedures  None  MDM  A positive blood type  Wet prep and GC Urine culture pending.   Assessment and Plan   A;  Vaginal bleeding in second trimester; unknown cause likely cervical.       Cramping in second trimester      Judi Cong  Di Twin gestation  P:  Plan to discharge home with instructions to continue pelvic rest until all bleeding stops      Keep scheduled appt with [facility] for continued care      Return or call office for worsening sxs.       Increase oral fluid at home    Rasch, Harolyn Rutherford, NP 09/15/2018 8:11 PM

## 2018-09-15 NOTE — Discharge Instructions (Signed)
° °Pelvic Rest °Pelvic rest may be recommended if: °· Your placenta is partially or completely covering the opening of your cervix (placenta previa). °· There is bleeding between the wall of the uterus and the amniotic sac in the first trimester of pregnancy (subchorionic hemorrhage). °· You went into labor too early (preterm labor). ° °Based on your overall health and the health of your baby, your health care provider will decide if pelvic rest is right for you. °How do I rest my pelvis? °For as long as told by your health care provider: °· Do not have sex, sexual stimulation, or an orgasm. °· Do not use tampons. Do not douche. Do not put anything in your vagina. °· Do not lift anything that is heavier than 10 lb (4.5 kg). °· Avoid activities that take a lot of effort (are strenuous). °· Avoid any activity in which your pelvic muscles could become strained. ° °When should I seek medical care? °Seek medical care if you have: °· Cramping pain in your lower abdomen. °· Vaginal discharge. °· A low, dull backache. °· Regular contractions. °· Uterine tightening. ° °When should I seek immediate medical care? °Seek immediate medical care if: °· You have vaginal bleeding and you are pregnant. ° °This information is not intended to replace advice given to you by your health care provider. Make sure you discuss any questions you have with your health care provider. °Document Released: 02/23/2011 Document Revised: 04/05/2016 Document Reviewed: 05/02/2015 °Elsevier Interactive Patient Education © 2018 Elsevier Inc. °Vaginal Bleeding During Pregnancy, Second Trimester °A small amount of bleeding (spotting) from the vagina is common in pregnancy. Sometimes the bleeding is normal and is not a problem, and sometimes it is a sign of something serious. Be sure to tell your doctor about any bleeding from your vagina right away. °Follow these instructions at home: °· Watch your condition for any changes. °· Follow your doctor's  instructions about how active you can be. °· If you are on bed rest: °? You may need to stay in bed and only get up to use the bathroom. °? You may be allowed to do some activities. °? If you need help, make plans for someone to help you. °· Write down: °? The number of pads you use each day. °? How often you change pads. °? How soaked (saturated) your pads are. °· Do not use tampons. °· Do not douche. °· Do not have sex or orgasms until your doctor says it is okay. °· If you pass any tissue from your vagina, save the tissue so you can show it to your doctor. °· Only take medicines as told by your doctor. °· Do not take aspirin because it can make you bleed. °· Do not exercise, lift heavy weights, or do any activities that take a lot of energy and effort unless your doctor says it is okay. °· Keep all follow-up visits as told by your doctor. °Contact a doctor if: °· You bleed from your vagina. °· You have cramps. °· You have labor pains. °· You have a fever that does not go away after you take medicine. °Get help right away if: °· You have very bad cramps in your back or belly (abdomen). °· You have contractions. °· You have chills. °· You pass large clots or tissue from your vagina. °· You bleed more. °· You feel light-headed or weak. °· You pass out (faint). °· You are leaking fluid or have a gush of fluid from your   vagina. °This information is not intended to replace advice given to you by your health care provider. Make sure you discuss any questions you have with your health care provider. °Document Released: 03/15/2014 Document Revised: 04/05/2016 Document Reviewed: 07/06/2013 °Elsevier Interactive Patient Education © 2018 Elsevier Inc. ° °

## 2018-09-16 LAB — GC/CHLAMYDIA PROBE AMP (~~LOC~~) NOT AT ARMC
Chlamydia: NEGATIVE
Neisseria Gonorrhea: NEGATIVE

## 2018-09-16 LAB — CULTURE, OB URINE: Special Requests: NORMAL

## 2018-09-17 LAB — SMN1 COPY NUMBER ANALYSIS (SMA CARRIER SCREENING)

## 2018-09-17 LAB — CYSTIC FIBROSIS MUTATION 97: Interpretation: NOT DETECTED

## 2018-09-18 ENCOUNTER — Telehealth: Payer: Self-pay

## 2018-09-18 NOTE — Telephone Encounter (Signed)
TC to [place] confirming twins was NOT noted on genetic screening form Test will be redone

## 2018-09-24 ENCOUNTER — Encounter (HOSPITAL_COMMUNITY): Payer: Self-pay

## 2018-10-02 ENCOUNTER — Ambulatory Visit (HOSPITAL_COMMUNITY)
Admission: RE | Admit: 2018-10-02 | Discharge: 2018-10-02 | Disposition: A | Discharge status: Home / Self Care | Payer: 59 | Source: Ambulatory Visit | Attending: Obstetrics and Gynecology | Admitting: Obstetrics and Gynecology

## 2018-10-02 ENCOUNTER — Encounter (HOSPITAL_COMMUNITY): Payer: Self-pay

## 2018-10-02 DIAGNOSIS — Z3A17 17 weeks gestation of pregnancy: Secondary | ICD-10-CM | POA: Insufficient documentation

## 2018-10-02 DIAGNOSIS — O30042 Twin pregnancy, dichorionic/diamniotic, second trimester: Secondary | ICD-10-CM

## 2018-10-02 DIAGNOSIS — Z3480 Encounter for supervision of other normal pregnancy, unspecified trimester: Secondary | ICD-10-CM

## 2018-10-02 DIAGNOSIS — O34219 Maternal care for unspecified type scar from previous cesarean delivery: Secondary | ICD-10-CM | POA: Insufficient documentation

## 2018-10-02 DIAGNOSIS — Z363 Encounter for antenatal screening for malformations: Secondary | ICD-10-CM | POA: Insufficient documentation

## 2018-10-02 DIAGNOSIS — O30049 Twin pregnancy, dichorionic/diamniotic, unspecified trimester: Secondary | ICD-10-CM

## 2018-10-02 NOTE — MAU Note (Signed)
Pt reports she has been experiencing vaginal bleeding since the beginning of Nov.  Bright red and changing sanitary pad 3-4 times a day.  Pt states she hasn't felt fetal movement.

## 2018-10-03 ENCOUNTER — Other Ambulatory Visit (HOSPITAL_COMMUNITY): Payer: Self-pay | Admitting: *Deleted

## 2018-10-03 DIAGNOSIS — O30042 Twin pregnancy, dichorionic/diamniotic, second trimester: Secondary | ICD-10-CM

## 2018-10-06 ENCOUNTER — Encounter: Payer: 59 | Admitting: Obstetrics and Gynecology

## 2018-10-10 ENCOUNTER — Emergency Department (HOSPITAL_COMMUNITY)
Admission: EM | Admit: 2018-10-10 | Discharge: 2018-10-10 | Discharge status: Against Medical Advice | Payer: 59 | Attending: Emergency Medicine | Admitting: Emergency Medicine

## 2018-10-10 ENCOUNTER — Other Ambulatory Visit: Payer: Self-pay

## 2018-10-10 ENCOUNTER — Encounter (HOSPITAL_COMMUNITY): Payer: Self-pay

## 2018-10-10 DIAGNOSIS — Z3A00 Weeks of gestation of pregnancy not specified: Secondary | ICD-10-CM | POA: Diagnosis not present

## 2018-10-10 DIAGNOSIS — O208 Other hemorrhage in early pregnancy: Secondary | ICD-10-CM | POA: Insufficient documentation

## 2018-10-10 NOTE — ED Notes (Signed)
Pt called for 3rd time. No answer.

## 2018-10-10 NOTE — ED Notes (Signed)
Called Pt to be roomed x2. No response in lobby, do not see Pt in lobby.

## 2018-10-10 NOTE — ED Notes (Signed)
Pt called for treatment room, no answer x 1.

## 2018-10-10 NOTE — ED Triage Notes (Signed)
Patient states she is having vaginal bleeding since being pregnant. [facility] Patient states the vaginal bleeding is worse today. Patient reports an appointment with her Ob/Gym on 10/21/18.

## 2018-10-21 ENCOUNTER — Encounter: Payer: Self-pay | Admitting: Obstetrics and Gynecology

## 2018-10-21 ENCOUNTER — Other Ambulatory Visit: Payer: Self-pay

## 2018-10-21 ENCOUNTER — Encounter (HOSPITAL_COMMUNITY): Payer: Self-pay

## 2018-10-21 ENCOUNTER — Inpatient Hospital Stay (HOSPITAL_BASED_OUTPATIENT_CLINIC_OR_DEPARTMENT_OTHER): Payer: 59

## 2018-10-21 ENCOUNTER — Ambulatory Visit (INDEPENDENT_AMBULATORY_CARE_PROVIDER_SITE_OTHER): Payer: 59 | Admitting: Obstetrics and Gynecology

## 2018-10-21 ENCOUNTER — Inpatient Hospital Stay (EMERGENCY_DEPARTMENT_HOSPITAL)
Admission: AD | Admit: 2018-10-21 | Discharge: 2018-10-21 | Disposition: A | Discharge status: Home / Self Care | Payer: 59 | Source: Home / Self Care | Attending: Obstetrics & Gynecology | Admitting: Obstetrics & Gynecology

## 2018-10-21 VITALS — BP 113/72 | HR 111 | Wt 150.9 lb

## 2018-10-21 DIAGNOSIS — O30049 Twin pregnancy, dichorionic/diamniotic, unspecified trimester: Secondary | ICD-10-CM

## 2018-10-21 DIAGNOSIS — O42912 Preterm premature rupture of membranes, unspecified as to length of time between rupture and onset of labor, second trimester: Secondary | ICD-10-CM | POA: Insufficient documentation

## 2018-10-21 DIAGNOSIS — Z3482 Encounter for supervision of other normal pregnancy, second trimester: Secondary | ICD-10-CM

## 2018-10-21 DIAGNOSIS — O30042 Twin pregnancy, dichorionic/diamniotic, second trimester: Secondary | ICD-10-CM | POA: Diagnosis not present

## 2018-10-21 DIAGNOSIS — Z3A20 20 weeks gestation of pregnancy: Secondary | ICD-10-CM

## 2018-10-21 DIAGNOSIS — O4292 Full-term premature rupture of membranes, unspecified as to length of time between rupture and onset of labor: Secondary | ICD-10-CM | POA: Diagnosis not present

## 2018-10-21 DIAGNOSIS — O34219 Maternal care for unspecified type scar from previous cesarean delivery: Secondary | ICD-10-CM

## 2018-10-21 DIAGNOSIS — O4692 Antepartum hemorrhage, unspecified, second trimester: Secondary | ICD-10-CM

## 2018-10-21 DIAGNOSIS — O42919 Preterm premature rupture of membranes, unspecified as to length of time between rupture and onset of labor, unspecified trimester: Secondary | ICD-10-CM

## 2018-10-21 DIAGNOSIS — Z3480 Encounter for supervision of other normal pregnancy, unspecified trimester: Secondary | ICD-10-CM

## 2018-10-21 LAB — CBC WITH DIFFERENTIAL/PLATELET
Basophils Absolute: 0 10*3/uL (ref 0.0–0.1)
Basophils Relative: 0 %
EOS ABS: 0.1 10*3/uL (ref 0.0–0.5)
Eosinophils Relative: 0 %
HCT: 27.7 % — ABNORMAL LOW (ref 36.0–46.0)
HEMOGLOBIN: 9.2 g/dL — AB (ref 12.0–15.0)
LYMPHS ABS: 1.6 10*3/uL (ref 0.7–4.0)
Lymphocytes Relative: 13 %
MCH: 28.8 pg (ref 26.0–34.0)
MCHC: 33.2 g/dL (ref 30.0–36.0)
MCV: 86.8 fL (ref 80.0–100.0)
Monocytes Absolute: 0.6 10*3/uL (ref 0.1–1.0)
Monocytes Relative: 5 %
Neutro Abs: 10.5 10*3/uL — ABNORMAL HIGH (ref 1.7–7.7)
Neutrophils Relative %: 82 %
Platelets: 348 10*3/uL (ref 150–400)
RBC: 3.19 MIL/uL — ABNORMAL LOW (ref 3.87–5.11)
RDW: 12.8 % (ref 11.5–15.5)
WBC: 12.8 10*3/uL — ABNORMAL HIGH (ref 4.0–10.5)
nRBC: 0 % (ref 0.0–0.2)

## 2018-10-21 LAB — ABO/RH: ABO/RH(D): A POS

## 2018-10-21 LAB — POCT FERN TEST: POCT Fern Test: POSITIVE

## 2018-10-21 LAB — URINALYSIS, ROUTINE W REFLEX MICROSCOPIC
Bacteria, UA: NONE SEEN
Bilirubin Urine: NEGATIVE
GLUCOSE, UA: NEGATIVE mg/dL
Hgb urine dipstick: NEGATIVE
Ketones, ur: NEGATIVE mg/dL
Leukocytes, UA: NEGATIVE
Nitrite: NEGATIVE
PH: 9 — AB (ref 5.0–8.0)
PROTEIN: NEGATIVE mg/dL
Specific Gravity, Urine: 1.012 (ref 1.005–1.030)

## 2018-10-21 NOTE — Discharge Instructions (Signed)

## 2018-10-21 NOTE — Consult Note (Signed)
MFM Consult HPI Ms. Dan HumphreysWalker was seen for a high risk pregnancy consultation due to vaginal bleeding and  preterm prelabor rupture of membranes. Her partner was present for the duration of this consultation.  She is a 22 year old G2P1001 at 3420 weeks gestational age who reports vaginal bleeding with intermittent lower abdominal pain and and continuous leakage of fluid vaginally for the last 3 days.  Denies any history of trauma and or associated fever.   This is a dichorionic-diamniotic twin gestation.   Past obstetric and gynecology history: Live term cesarean delivery following failed induction of labor in 2012; uncomplicated.   Past Medical History: none  Past Surgical History: none  Social History: She smokes 3-4 sticks/day,but denies alcohol intake or use of illicit drugs. Ms. 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  Family History: Not contributory.  Review of systems: Normal 10 system except for findings as noted.   Examination:  Oriented in time place and person No pallor, anicteric afebrile  Please see systemic examination of admitting team.  Pelvic deferred. Please see admitting teams notes for details  Ultrasound findings: Live dichorionic-diamniotic twin gestation with  anhydramnios noted.    Assessment:  Previable Prelabor Rupture of Membranes in dichorionic twin gestation  Discussions/Recommendations I explained to Ms. Lichtenwalner and her partner that this complicates ~0.4% of all pregnancies and associated with significant neonatal morbidity and mortality. While overall live birth rates is ~50%, survival to discharge is about 26%(lower with earlier gestations).Intact survival is <5%.  Options for management were discussed and includes pregnancy discontinuation vs. Expectant management. Pregnancy discontinuation at this gestational age,will definitely lead to fetal demise or neonatal  demise. The advantage is averting potential maternal morbidity and possible mortality as will be discussed later. However, if expectant management is chosen, it should be realized that in addition to risk of fetal demise, there is also risk of Potters sequence(which includes pulmonary hypoplasia) which may lead to significant immediate neonatal morbidity and mortality despite all efforts. The risk of maternal sepsis was discussed which may rarely lead to maternal mortality. Ms.Pinzon and her partner verbalized understanding of our discussion and are presently undecided.  In the event that she chooses pregnancy discontinuation, medical induction of labor should be performed as per unit protocol.  On the other hand if she opts for expectant management, the following is recommended - In house care for 24-48 hours to ensure that  Labor does not occur. No steroids, antibiotics and or magnesium sulfate infusion as this is not recommended at this gestational age. - Following discharge, she will be seen weekly until re-admission at 22 weeks and 6 days were PPROM protocol will be administered, if fetal demise has not occurred. -She understands expectant management will be discontinued in the event of sepsis/clinical chorioamniotis, significant antepartum bleeding and fetal demise.  All questions answered and I have notified in-house team and MAU provider about findings discussions and recommended plan of care.  Blase MessHenry Sheretta Grumbine MD MFM

## 2018-10-21 NOTE — Progress Notes (Signed)
   PRENATAL VISIT NOTE  Subjective:  Phyllis Alexander is a 22 y.o. G3P1011 at 2462w4d being seen today for ongoing prenatal care.  She is currently monitored for the following issues for this high-risk pregnancy and has Supervision of other normal pregnancy, antepartum; Dichorionic diamniotic twin pregnancy, antepartum; and Previous cesarean section complicating pregnancy, antepartum condition or complication on their problem list.  Patient reports persistent vaginal bleeding for the past month. Patient states that she saturates 3-4 pads per day with occasional passage of clots. She reports lower abdominal cramping. Patient also reports leakage of fluid on a few occasions.  Contractions: Not present. Vag. Bleeding: Moderate.  Movement: Present. Denies leaking of fluid.   The following portions of the patient's history were reviewed and updated as appropriate: allergies, current medications, past family history, past medical history, past social history, past surgical history and problem list. Problem list updated.  Objective:   Vitals:   10/21/18 1316  BP: 113/72  Pulse: (!) 111  Weight: 150 lb 14.4 oz (68.4 kg)    Fetal Status: Fetal Heart Rate (bpm): A:161/B:170   Movement: Present     General:  Alert, oriented and cooperative. Patient is in no acute distress.  Skin: Skin is warm and dry. No rash noted.   Cardiovascular: Normal heart rate noted  Respiratory: Normal respiratory effort, no problems with respiration noted  Abdomen: Soft, gravid, appropriate for gestational age.  Pain/Pressure: Present     Pelvic: Cervical exam deferred        Extremities: Normal range of motion.  Edema: None  Mental Status: Normal mood and affect. Normal behavior. Normal judgment and thought content.   Assessment and Plan:  Pregnancy: G3P1011 at 242w4d  1. Supervision of other normal pregnancy, antepartum Patient sent to MAU to rule out PPROM and abruption. Patient declines any recent intercourse. She  denies fever/chills Pelvic exam deferred to MAU AFP today  2. Dichorionic diamniotic twin pregnancy, antepartum Normal anatomy ultrasound Follow up growth scheduled  3. Previous cesarean section complicating pregnancy, antepartum condition or complication Will discuss delivery plan at a later visit  Preterm labor symptoms and general obstetric precautions including but not limited to vaginal bleeding, contractions, leaking of fluid and fetal movement were reviewed in detail with the patient. Please refer to After Visit Summary for other counseling recommendations.  Return in about 4 weeks (around 11/18/2018) for ROB.  Future Appointments  Date Time Provider Department Center  10/30/2018  1:30 PM [facility] US 1 [facility]    Catalina AntiguaPeggy Briyan Kleven, MD

## 2018-10-21 NOTE — Progress Notes (Signed)
ROB.  C/o bleeding x 3++ months, using a pad and pain 6-7/10. She also had heavy clear fluid gushing when she stands up x 1 day.

## 2018-10-21 NOTE — MAU Note (Addendum)
Pt C/O heavy bleeding, passing clots for the past month,  also having ? Leaking of fluid.  Has changed two pads today, saturated.  Also C/O lower abdominal pain since the beginning of her pregnancy.  Twin gestation.  Pt sent from MD office today for ? Placenta abruption.

## 2018-10-21 NOTE — MAU Provider Note (Signed)
History     CSN: [id number]  Arrival date and time: 10/21/18 1355   First Provider Initiated Contact with Patient 10/21/18 1455      Chief Complaint  Patient presents with  . Vaginal Bleeding  . Rupture of Membranes  . Abdominal Pain   HPI Phyllis SciaraShekira Crusoe is a 22 y.o. G3P1011 at 639w4d who presents for evaluation of possible rupture of membranes and bleeding. She states for several weeks she has had bright red bleeding that was saturating pads with clots. She states she has saturated 3 pads with blood today. She states 3 days ago, the bleeding became watery and she noticed gushes of clear fluid too. She denies any pain. She reports intermittent fetal movement. She has a normal anatomy scan on 11/21 and reports she was bleeding at that time too.   OB History    Gravida  3   Para  1   Term  1   Preterm      AB  1   Living  1     SAB  0   TAB  1   Ectopic      Multiple      Live Births  1           Past Medical History:  Diagnosis Date  . Medical history non-contributory     Past Surgical History:  Procedure Laterality Date  . CESAREAN SECTION    . DILATION AND CURETTAGE OF UTERUS    . WISDOM TOOTH EXTRACTION      Family History  Problem Relation Age of Onset  . Diabetes Other   . ADD / ADHD Brother   . Hypertension Maternal Aunt   . Arthritis Maternal Grandmother   . Diabetes Maternal Grandmother   . Hypertension Maternal Grandmother   . Stroke Maternal Grandmother   . Arthritis Paternal Grandmother     Social History   Tobacco Use  . Smoking status: Former Smoker    Packs/day: 0.50    Types: Cigarettes    Last attempt to quit: 08/15/2018    Years since quitting: 0.1  . Smokeless tobacco: Never Used  Substance Use Topics  . Alcohol use: No  . Drug use: Never    Allergies: No Known Allergies  No medications prior to admission.    Review of Systems  Constitutional: Negative.  Negative for fatigue and fever.  HENT: Negative.    Respiratory: Negative.  Negative for shortness of breath.   Cardiovascular: Negative.  Negative for chest pain.  Gastrointestinal: Negative.  Negative for abdominal pain, constipation, diarrhea, nausea and vomiting.  Genitourinary: Positive for vaginal bleeding and vaginal discharge. Negative for dysuria.  Neurological: Negative.  Negative for dizziness and headaches.   Physical Exam   Blood pressure 124/74, pulse 88, temperature 98.3 F (36.8 C), temperature source Oral, resp. rate 18, weight 68.1 kg, last menstrual period 06/22/2018.  Physical Exam  Nursing note and vitals reviewed. Constitutional: She is oriented to person, place, and time. She appears well-developed and well-nourished. No distress.  HENT:  Head: Normocephalic.  Eyes: Pupils are equal, round, and reactive to light.  Cardiovascular: Normal rate, regular rhythm and normal heart sounds.  Respiratory: Effort normal and breath sounds normal. No respiratory distress.  GI: Soft. Bowel sounds are normal. She exhibits no distension. There is no tenderness.  Genitourinary:  Genitourinary Comments: SSE: pooling of watery blood in vaginal vault. Small amount of bright red bleeding oozing from cervical os. One marble sized clot removed  Neurological:  She is alert and oriented to person, place, and time.  Skin: Skin is warm and dry.  Psychiatric: She has a normal mood and affect. Her behavior is normal. Judgment and thought content normal.    MAU Course  Procedures Results for orders placed or performed during the hospital encounter of 10/21/18 (from the past 24 hour(s))  Urinalysis, Routine w reflex microscopic     Status: Abnormal   Collection Time: 10/21/18  2:18 PM  Result Value Ref Range   Color, Urine YELLOW YELLOW   APPearance TURBID (A) CLEAR   Specific Gravity, Urine 1.012 1.005 - 1.030   pH 9.0 (H) 5.0 - 8.0   Glucose, UA NEGATIVE NEGATIVE mg/dL   Hgb urine dipstick NEGATIVE NEGATIVE   Bilirubin Urine NEGATIVE  NEGATIVE   Ketones, ur NEGATIVE NEGATIVE mg/dL   Protein, ur NEGATIVE NEGATIVE mg/dL   Nitrite NEGATIVE NEGATIVE   Leukocytes, UA NEGATIVE NEGATIVE   RBC / HPF 11-20 0 - 5 RBC/hpf   WBC, UA 0-5 0 - 5 WBC/hpf   Bacteria, UA NONE SEEN NONE SEEN   Squamous Epithelial / LPF 0-5 0 - 5  Fern Test     Status: None   Collection Time: 10/21/18  3:34 PM  Result Value Ref Range   POCT Fern Test Positive = ruptured amniotic membanes   CBC with Differential/Platelet     Status: Abnormal   Collection Time: 10/21/18  5:11 PM  Result Value Ref Range   WBC 12.8 (H) 4.0 - 10.5 K/uL   RBC 3.19 (L) 3.87 - 5.11 MIL/uL   Hemoglobin 9.2 (L) 12.0 - 15.0 g/dL   HCT 16.1 (L) 09.6 - 04.5 %   MCV 86.8 80.0 - 100.0 fL   MCH 28.8 26.0 - 34.0 pg   MCHC 33.2 30.0 - 36.0 g/dL   RDW 40.9 81.1 - 91.4 %   Platelets 348 150 - 400 K/uL   nRBC 0.0 0.0 - 0.2 %   Neutrophils Relative % 82 %   Neutro Abs 10.5 (H) 1.7 - 7.7 K/uL   Lymphocytes Relative 13 %   Lymphs Abs 1.6 0.7 - 4.0 K/uL   Monocytes Relative 5 %   Monocytes Absolute 0.6 0.1 - 1.0 K/uL   Eosinophils Relative 0 %   Eosinophils Absolute 0.1 0.0 - 0.5 K/uL   Basophils Relative 0 %   Basophils Absolute 0.0 0.0 - 0.1 K/uL  Type and screen [facility]     Status: None   Collection Time: 10/21/18  5:11 PM  Result Value Ref Range   ABO/RH(D) A POS    Antibody Screen NEG    Sample Expiration      10/24/2018 Performed at [facility], [address], [place], [place] [address]    [place] Mfm Ob Limited  Result Date: 10/21/2018 ----------------------------------------------------------------------  OBSTETRICS REPORT                       (Signed Final 10/21/2018 04:57 pm) ---------------------------------------------------------------------- Patient Info  ID #:       [id number]                          D.O.B.:  [birth year] (22 yrs)  Name:       Phyllis Alexander                  Visit Date: 10/21/2018 03:13 pm  ---------------------------------------------------------------------- Performed By  Performed By:  Lenise Arena        Referred By:      MAU Nursing-                    RDMS                                     MAU/Triage  Attending:        Blase Mess MD       Location:         [facility] ---------------------------------------------------------------------- Orders   #  Description                          Code         Ordered By   1  [place] MFM OB LIMITED                    [id number]     Melrose Kearse  ----------------------------------------------------------------------   #  Order #                    Accession #                 Episode #   1  [id number]                  [id number]                  [id number]  ---------------------------------------------------------------------- Indications   Twin pregnancy, di/di, second trimester        O30.042   Previous cesarean delivery, antepartum         O34.219   [redacted] weeks gestation of pregnancy                Z3A.20   Vaginal bleeding in pregnancy, second          O46.92   trimester (heavy bleeding, passing clots x 1   month)   Leakage of amniotic fluid - gushing x1 day     O42.90  ---------------------------------------------------------------------- Vital Signs  Weight (lb): 150                               Height:        5'8"  BMI:         22.8 ---------------------------------------------------------------------- Fetal Evaluation (Fetus A)  Num Of Fetuses:         2  Fetal Heart Rate(bpm):  175  Cardiac Activity:       Observed  Fetal Lie:              Maternal right side  Presentation:           Breech  Placenta:               Posterior  Amniotic Fluid  AFI FV:      Anhydramnios ---------------------------------------------------------------------- OB History  Gravidity:    3         Term:   1        Prem:   0        SAB:   0  TOP:          1       Ectopic:  0        Living: 1 ----------------------------------------------------------------------  Gestational Age (Fetus  A)  LMP:           17w 2d        Date:  06/22/18                 EDD:   03/29/19  Best:          Cherylann Parr 4d     Det. ByMarcella Dubs         EDD:   03/06/19                                      (08/22/18) ---------------------------------------------------------------------- Anatomy (Fetus A)  Stomach:               Appears normal, left   Bladder:                Appears normal                         sided ---------------------------------------------------------------------- Fetal Evaluation (Fetus B)  Num Of Fetuses:         2  Fetal Heart Rate(bpm):  168  Cardiac Activity:       Observed  Fetal Lie:              Maternal left side  Presentation:           Transverse, head to maternal left  Placenta:               Posterior  Amniotic Fluid  AFI FV:      Anhydramnios ---------------------------------------------------------------------- Gestational Age (Fetus B)  LMP:           17w 2d        Date:  06/22/18                 EDD:   03/29/19  Best:          Cherylann Parr 4d     Det. ByMarcella Dubs         EDD:   03/06/19                                      (08/22/18) ---------------------------------------------------------------------- Anatomy (Fetus B)  Stomach:               Absence of fluid       Bladder:                Appears normal                         filled stomach ---------------------------------------------------------------------- Cervix Uterus Adnexa  Cervix  Length:            2.4  cm. ---------------------------------------------------------------------- Impression  Live dichorionic-diamniotic twin gestation with anhydramnios  in both amniotic sacs ---------------------------------------------------------------------- Recommendations  Please refer to [facility] EMR for detailed consultation and  recommended plan of care. ----------------------------------------------------------------------                 Blase Mess, MD Electronically Signed Final Report   10/21/2018 04:57 pm  ----------------------------------------------------------------------  MDM UA Crist Fat- positive [place] MFM OB Limited- anhydramnios in both babies, MFM consult CBC with Diff  Lengthy conversation with patient about options after return from MFM. Patient desires expectant management at this time. Return precautions  extensively reviewed including vaginal bleeding, abdominal pain and fever. Patient instructed to return immediately if any symptoms worsen or change.  Vital signs stable at discharge. No increase in pain or bleeding.  Assessment and Plan   1. Preterm premature rupture of membranes (PPROM) with unknown onset of labor   2. [redacted] weeks gestation of pregnancy    -Discharge home in stable condition -Pain, bleeding and infection precautions discussed -Patient advised to follow-up with [facility] this week, message sent. -Patient may return to MAU as needed or if her condition were to change or worsen   Rolm Bookbinder CNM 10/21/2018, 6:55 PM

## 2018-10-22 ENCOUNTER — Inpatient Hospital Stay (HOSPITAL_COMMUNITY)
Admission: AD | Admit: 2018-10-22 | Discharge: 2018-10-23 | DRG: 806 | Disposition: A | Discharge status: Home / Self Care | Payer: 59 | Attending: Obstetrics and Gynecology | Admitting: Obstetrics and Gynecology

## 2018-10-22 ENCOUNTER — Inpatient Hospital Stay (HOSPITAL_COMMUNITY): Payer: 59 | Admitting: Anesthesiology

## 2018-10-22 ENCOUNTER — Encounter (HOSPITAL_COMMUNITY): Payer: Self-pay

## 2018-10-22 DIAGNOSIS — O30042 Twin pregnancy, dichorionic/diamniotic, second trimester: Secondary | ICD-10-CM

## 2018-10-22 DIAGNOSIS — Z3480 Encounter for supervision of other normal pregnancy, unspecified trimester: Secondary | ICD-10-CM

## 2018-10-22 DIAGNOSIS — O42912 Preterm premature rupture of membranes, unspecified as to length of time between rupture and onset of labor, second trimester: Principal | ICD-10-CM | POA: Diagnosis present

## 2018-10-22 DIAGNOSIS — Z87891 Personal history of nicotine dependence: Secondary | ICD-10-CM | POA: Diagnosis not present

## 2018-10-22 DIAGNOSIS — Z3A20 20 weeks gestation of pregnancy: Secondary | ICD-10-CM

## 2018-10-22 DIAGNOSIS — O4290 Premature rupture of membranes, unspecified as to length of time between rupture and onset of labor, unspecified weeks of gestation: Secondary | ICD-10-CM | POA: Diagnosis present

## 2018-10-22 DIAGNOSIS — O42012 Preterm premature rupture of membranes, onset of labor within 24 hours of rupture, second trimester: Secondary | ICD-10-CM

## 2018-10-22 DIAGNOSIS — O30049 Twin pregnancy, dichorionic/diamniotic, unspecified trimester: Secondary | ICD-10-CM | POA: Diagnosis present

## 2018-10-22 DIAGNOSIS — O34219 Maternal care for unspecified type scar from previous cesarean delivery: Secondary | ICD-10-CM | POA: Diagnosis present

## 2018-10-22 LAB — CBC
HCT: 29.8 % — ABNORMAL LOW (ref 36.0–46.0)
Hemoglobin: 9.8 g/dL — ABNORMAL LOW (ref 12.0–15.0)
MCH: 28.7 pg (ref 26.0–34.0)
MCHC: 32.9 g/dL (ref 30.0–36.0)
MCV: 87.1 fL (ref 80.0–100.0)
Platelets: 372 10*3/uL (ref 150–400)
RBC: 3.42 MIL/uL — ABNORMAL LOW (ref 3.87–5.11)
RDW: 12.8 % (ref 11.5–15.5)
WBC: 19.3 10*3/uL — ABNORMAL HIGH (ref 4.0–10.5)
nRBC: 0 % (ref 0.0–0.2)

## 2018-10-22 LAB — TYPE AND SCREEN
ABO/RH(D): A POS
ABO/RH(D): A POS
ANTIBODY SCREEN: NEGATIVE
Antibody Screen: NEGATIVE

## 2018-10-22 MED ORDER — PHENYLEPHRINE 40 MCG/ML (10ML) SYRINGE FOR IV PUSH (FOR BLOOD PRESSURE SUPPORT)
80.0000 ug | PREFILLED_SYRINGE | INTRAVENOUS | Status: DC | PRN
  Filled 2018-10-22: qty 10

## 2018-10-22 MED ORDER — ONDANSETRON HCL 4 MG/2ML IJ SOLN
4.0000 mg | Freq: Four times a day (QID) | INTRAMUSCULAR | Status: DC | PRN
  Administered 2018-10-22: 4 mg via INTRAVENOUS
  Filled 2018-10-22: qty 2

## 2018-10-22 MED ORDER — FENTANYL 2.5 MCG/ML BUPIVACAINE 1/10 % EPIDURAL INFUSION (WH - ANES)
14.0000 mL/h | INTRAMUSCULAR | Status: DC | PRN
  Administered 2018-10-22: 14 mL/h via EPIDURAL

## 2018-10-22 MED ORDER — OXYCODONE-ACETAMINOPHEN 5-325 MG PO TABS: 2.0000 | ORAL_TABLET | ORAL | Status: DC | PRN

## 2018-10-22 MED ORDER — FENTANYL CITRATE (PF) 100 MCG/2ML IJ SOLN
100.0000 ug | Freq: Once | INTRAMUSCULAR | Status: AC
  Administered 2018-10-22: 100 ug via INTRAVENOUS
  Filled 2018-10-22: qty 2

## 2018-10-22 MED ORDER — SOD CITRATE-CITRIC ACID 500-334 MG/5ML PO SOLN: 30.0000 mL | ORAL | Status: DC | PRN

## 2018-10-22 MED ORDER — LIDOCAINE HCL (PF) 1 % IJ SOLN
30.0000 mL | INTRAMUSCULAR | Status: DC | PRN
  Filled 2018-10-22: qty 30

## 2018-10-22 MED ORDER — EPHEDRINE 5 MG/ML INJ
INTRAVENOUS | Status: AC
  Filled 2018-10-22: qty 4

## 2018-10-22 MED ORDER — LIDOCAINE HCL (PF) 1 % IJ SOLN
INTRAMUSCULAR | Status: DC | PRN
  Administered 2018-10-22: 13 mL via EPIDURAL

## 2018-10-22 MED ORDER — OXYTOCIN BOLUS FROM INFUSION
500.0000 mL | Freq: Once | INTRAVENOUS | Status: AC
  Administered 2018-10-22: 500 mL via INTRAVENOUS

## 2018-10-22 MED ORDER — EPHEDRINE 5 MG/ML INJ
10.0000 mg | INTRAVENOUS | Status: DC | PRN
  Filled 2018-10-22: qty 2

## 2018-10-22 MED ORDER — ACETAMINOPHEN 325 MG PO TABS: 650.0000 mg | ORAL_TABLET | ORAL | Status: DC | PRN

## 2018-10-22 MED ORDER — LACTATED RINGERS IV SOLN: INTRAVENOUS | Status: DC

## 2018-10-22 MED ORDER — OXYCODONE-ACETAMINOPHEN 5-325 MG PO TABS: 1.0000 | ORAL_TABLET | ORAL | Status: DC | PRN

## 2018-10-22 MED ORDER — OXYTOCIN 40 UNITS IN LACTATED RINGERS INFUSION - SIMPLE MED
2.5000 [IU]/h | INTRAVENOUS | Status: DC
  Filled 2018-10-22: qty 1000

## 2018-10-22 MED ORDER — LACTATED RINGERS IV SOLN: 500.0000 mL | Freq: Once | INTRAVENOUS | Status: DC

## 2018-10-22 MED ORDER — LACTATED RINGERS IV SOLN: 500.0000 mL | INTRAVENOUS | Status: DC | PRN

## 2018-10-22 MED ORDER — DIPHENHYDRAMINE HCL 50 MG/ML IJ SOLN: 12.5000 mg | INTRAMUSCULAR | Status: DC | PRN

## 2018-10-22 MED ORDER — FENTANYL 2.5 MCG/ML BUPIVACAINE 1/10 % EPIDURAL INFUSION (WH - ANES)
INTRAMUSCULAR | Status: AC
  Filled 2018-10-22: qty 100

## 2018-10-22 MED ORDER — BUTORPHANOL TARTRATE 1 MG/ML IJ SOLN
2.0000 mg | Freq: Once | INTRAMUSCULAR | Status: AC
  Administered 2018-10-22: 2 mg via INTRAVENOUS

## 2018-10-22 MED ORDER — BUTORPHANOL TARTRATE 1 MG/ML IJ SOLN
INTRAMUSCULAR | Status: AC
  Filled 2018-10-22: qty 2

## 2018-10-22 MED ORDER — LACTATED RINGERS IV SOLN
INTRAVENOUS | Status: DC
  Administered 2018-10-22 (×2): via INTRAVENOUS

## 2018-10-22 MED ORDER — PHENYLEPHRINE 40 MCG/ML (10ML) SYRINGE FOR IV PUSH (FOR BLOOD PRESSURE SUPPORT)
PREFILLED_SYRINGE | INTRAVENOUS | Status: AC
  Filled 2018-10-22: qty 20

## 2018-10-22 NOTE — MAU Note (Signed)
Pt presents in active labor, expedited transfer to labor and delivery.

## 2018-10-22 NOTE — Anesthesia Preprocedure Evaluation (Signed)
Anesthesia Evaluation  Patient identified by MRN, date of birth, ID band Patient awake    Reviewed: Allergy & Precautions, NPO status , Patient's Chart, lab work & pertinent test results  Airway Mallampati: II  TM Distance: >3 FB Neck ROM: Full    Dental no notable dental hx.    Pulmonary neg pulmonary ROS, former smoker,    Pulmonary exam normal breath sounds clear to auscultation       Cardiovascular negative cardio ROS Normal cardiovascular exam Rhythm:Regular Rate:Normal     Neuro/Psych negative neurological ROS  negative psych ROS   GI/Hepatic negative GI ROS, Neg liver ROS,   Endo/Other  negative endocrine ROS  Renal/GU negative Renal ROS  negative genitourinary   Musculoskeletal negative musculoskeletal ROS (+)   Abdominal   Peds negative pediatric ROS (+)  Hematology negative hematology ROS (+)   Anesthesia Other Findings   Reproductive/Obstetrics (+) Pregnancy                             Anesthesia Physical Anesthesia Plan  ASA: II  Anesthesia Plan: Epidural   Post-op Pain Management:    Induction:   PONV Risk Score and Plan:   Airway Management Planned:   Additional Equipment:   Intra-op Plan:   Post-operative Plan:   Informed Consent:   Plan Discussed with:   Anesthesia Plan Comments:         Anesthesia Quick Evaluation  

## 2018-10-22 NOTE — H&P (Signed)
OBSTETRIC ADMISSION HISTORY AND PHYSICAL  Phyllis Alexander is a 22 y.o. female G50P1011 with Di DiTwin IUP at [redacted]w[redacted]d by L/12 presenting for vaginal bleeding, pressure and contractions every 3 min. Has known PPPROM confirmed 12/10 with anhydramnios x2She reports +FMs, +LOF- continued leaking, no blurry vision, headaches or peripheral edema, and RUQ pain.  SShe received her prenatal care at [facility] [place]   Dating: By L/12 --->  Estimated Date of Delivery: 03/06/19  [place] Mfm Ob Limited  Result Date: 10/21/2018 ----------------------------------------------------------------------  OBSTETRICS REPORT                       (Signed Final 10/21/2018 04:57 pm) ---------------------------------------------------------------------- Patient Info  ID #:       [id number]                          D.O.B.:  [birth year] (22 yrs)  Name:       Phyllis Alexander                  Visit Date: 10/21/2018 03:13 pm ---------------------------------------------------------------------- Performed By  Performed By:     Lenise Arena        Referred By:      MAU Nursing-                    RDMS                                     MAU/Triage  Attending:        Blase Mess MD       Location:         [facility] ---------------------------------------------------------------------- Orders   #  Description                          Code         Ordered By   1  [place] MFM OB LIMITED                    40981.19     CAROLINE NEILL  ----------------------------------------------------------------------   #  Order #                    Accession #                 Episode #   1  [id number]                  [id number]                  [id number]  ---------------------------------------------------------------------- Indications   Twin pregnancy, di/di, second trimester        O30.042   Previous cesarean delivery, antepartum         O34.219   [redacted] weeks gestation of pregnancy                Z3A.20   Vaginal bleeding in pregnancy, second          O46.92   trimester  (heavy bleeding, passing clots x 1   month)   Leakage of amniotic fluid - gushing x1 day     O42.90  ---------------------------------------------------------------------- Vital Signs  Weight (lb): 150  Height:        5'8"  BMI:         22.8 ---------------------------------------------------------------------- Fetal Evaluation (Fetus A)  Num Of Fetuses:         2  Fetal Heart Rate(bpm):  175  Cardiac Activity:       Observed  Fetal Lie:              Maternal right side  Presentation:           Breech  Placenta:               Posterior  Amniotic Fluid  AFI FV:      Anhydramnios ---------------------------------------------------------------------- OB History  Gravidity:    3         Term:   1        Prem:   0        SAB:   0  TOP:          1       Ectopic:  0        Living: 1 ---------------------------------------------------------------------- Gestational Age (Fetus A)  LMP:           17w 2d        Date:  06/22/18                 EDD:   03/29/19  Best:          Cherylann Parr 4d     Det. ByMarcella Dubs         EDD:   03/06/19                                      (08/22/18) ---------------------------------------------------------------------- Anatomy (Fetus A)  Stomach:               Appears normal, left   Bladder:                Appears normal                         sided ---------------------------------------------------------------------- Fetal Evaluation (Fetus B)  Num Of Fetuses:         2  Fetal Heart Rate(bpm):  168  Cardiac Activity:       Observed  Fetal Lie:              Maternal left side  Presentation:           Transverse, head to maternal left  Placenta:               Posterior  Amniotic Fluid  AFI FV:      Anhydramnios ---------------------------------------------------------------------- Gestational Age (Fetus B)  LMP:           17w 2d        Date:  06/22/18                 EDD:   03/29/19  Best:          Cherylann Parr 4d     Det. ByMarcella Dubs         EDD:   03/06/19                                       (08/22/18) ---------------------------------------------------------------------- Anatomy (  Fetus B)  Stomach:               Absence of fluid       Bladder:                Appears normal                         filled stomach ---------------------------------------------------------------------- Cervix Uterus Adnexa  Cervix  Length:            2.4  cm. ---------------------------------------------------------------------- Impression  Live dichorionic-diamniotic twin gestation with anhydramnios  in both amniotic sacs ---------------------------------------------------------------------- Recommendations  Please refer to [facility] EMR for detailed consultation and  recommended plan of care. ----------------------------------------------------------------------                 Blase Mess, MD Electronically Signed Final Report   10/21/2018 04:57 pm ----------------------------------------------------------------------   Prenatal History/Complications:  Past Medical History: Past Medical History:  Diagnosis Date  . Medical history non-contributory     Past Surgical History: Past Surgical History:  Procedure Laterality Date  . CESAREAN SECTION    . DILATION AND CURETTAGE OF UTERUS    . WISDOM TOOTH EXTRACTION      Obstetrical History: OB History    Gravida  3   Para  1   Term  1   Preterm      AB  1   Living  1     SAB  0   TAB  1   Ectopic      Multiple      Live Births  1           Social History: Social History   Socioeconomic History  . Marital status: Single    Spouse name: Not on file  . Number of children: Not on file  . Years of education: Not on file  . Highest education level: Not on file  Occupational History  . Not on file  Social Needs  . Financial resource strain: Not hard at all  . Food insecurity:    Worry: Never true    Inability: Never true  . Transportation needs:    Medical: Yes    Non-medical: Yes  Tobacco  Use  . Smoking status: Former Smoker    Packs/day: 0.50    Types: Cigarettes    Last attempt to quit: 08/15/2018    Years since quitting: 0.1  . Smokeless tobacco: Never Used  Substance and Sexual Activity  . Alcohol use: No  . Drug use: Never  . Sexual activity: Not Currently    Birth control/protection: None  Lifestyle  . Physical activity:    Days per week: 0 days    Minutes per session: Not on file  . Stress: Not on file  Relationships  . Social connections:    Talks on phone: More than three times a week    Gets together: Not on file    Attends religious service: Never    Active member of club or organization: No    Attends meetings of clubs or organizations: Never    Relationship status: Not on file  Other Topics Concern  . Not on file  Social History Narrative  . Not on file    Family History: Family History  Problem Relation Age of Onset  . Diabetes Other   . ADD / ADHD Brother   . Hypertension Maternal Aunt   . Arthritis Maternal Grandmother   . Diabetes Maternal  Grandmother   . Hypertension Maternal Grandmother   . Stroke Maternal Grandmother   . Arthritis Paternal Grandmother     Allergies: No Known Allergies  Medications Prior to Admission  Medication Sig Dispense Refill Last Dose  . ondansetron (ZOFRAN ODT) 8 MG disintegrating tablet Take 1 tablet (8 mg total) by mouth every 8 (eight) hours as needed for nausea or vomiting. (Patient not taking: Reported on 10/02/2018) 20 tablet 0 Not Taking  . Prenat-Fe Poly-Methfol-FA-DHA (VITAFOL ULTRA) 29-0.6-0.4-200 MG CAPS Take 1 tablet by mouth daily. (Patient not taking: Reported on 09/15/2018) 30 capsule 12 Not Taking     Review of Systems   All systems reviewed and negative except as stated in HPI  Blood pressure 129/78, pulse 96, temperature 98.7 F (37.1 C), resp. rate (!) 24, weight 67.7 kg, last menstrual period 06/22/2018, SpO2 100 %. General appearance: alert, cooperative and appears stated  age Lungs: clear to auscultation bilaterally Heart: regular rate and rhythm Abdomen: soft, non-tender; bowel sounds normal Pelvic: wnl Extremities: Homans sign is negative, no sign of DVT DTR's wnl Presentation: Dilation: (unable to say what cervical dialaton is due to fetal parts ) Exam by:: Venia Carbon, NP    Prenatal labs: ABO, Rh: --/--/A POS, A POS Performed at [facility], [address], [place], [place] [address]  [phone]) Antibody: NEG (12/10 1711) Rubella: 3.91 (10/29 1558) RPR: Non Reactive (10/29 1558)  HBsAg: Negative (10/29 1558)  HIV: Non Reactive (10/29 1558)  GBS:     Results for orders placed or performed during the hospital encounter of 10/22/18 (from the past 24 hour(s))  CBC   Collection Time: 10/22/18  3:39 PM  Result Value Ref Range   WBC 19.3 (H) 4.0 - 10.5 K/uL   RBC 3.42 (L) 3.87 - 5.11 MIL/uL   Hemoglobin 9.8 (L) 12.0 - 15.0 g/dL   HCT 40.9 (L) 81.1 - 91.4 %   MCV 87.1 80.0 - 100.0 fL   MCH 28.7 26.0 - 34.0 pg   MCHC 32.9 30.0 - 36.0 g/dL   RDW 78.2 95.6 - 21.3 %   Platelets 372 150 - 400 K/uL   nRBC 0.0 0.0 - 0.2 %  Results for orders placed or performed during the hospital encounter of 10/21/18 (from the past 24 hour(s))  CBC with Differential/Platelet   Collection Time: 10/21/18  5:11 PM  Result Value Ref Range   WBC 12.8 (H) 4.0 - 10.5 K/uL   RBC 3.19 (L) 3.87 - 5.11 MIL/uL   Hemoglobin 9.2 (L) 12.0 - 15.0 g/dL   HCT 08.6 (L) 57.8 - 46.9 %   MCV 86.8 80.0 - 100.0 fL   MCH 28.8 26.0 - 34.0 pg   MCHC 33.2 30.0 - 36.0 g/dL   RDW 62.9 52.8 - 41.3 %   Platelets 348 150 - 400 K/uL   nRBC 0.0 0.0 - 0.2 %   Neutrophils Relative % 82 %   Neutro Abs 10.5 (H) 1.7 - 7.7 K/uL   Lymphocytes Relative 13 %   Lymphs Abs 1.6 0.7 - 4.0 K/uL   Monocytes Relative 5 %   Monocytes Absolute 0.6 0.1 - 1.0 K/uL   Eosinophils Relative 0 %   Eosinophils Absolute 0.1 0.0 - 0.5 K/uL   Basophils Relative 0 %   Basophils Absolute 0.0 0.0 - 0.1  K/uL  Type and screen [facility] [place]   Collection Time: 10/21/18  5:11 PM  Result Value Ref Range   ABO/RH(D) A POS  Antibody Screen NEG    Sample Expiration      10/22/2018 Performed at [facility], [address] [place], [place] [address]   ABO/Rh   Collection Time: 10/21/18  5:11 PM  Result Value Ref Range   ABO/RH(D)      A POS Performed at [facility], [address] [place], [place] [address]     Patient Active Problem List   Diagnosis Date Noted  . Active preterm labor 10/22/2018  . Supervision of other normal pregnancy, antepartum 09/09/2018  . Dichorionic diamniotic twin pregnancy, antepartum 09/09/2018  . Previous cesarean section complicating pregnancy, antepartum condition or complication 09/09/2018    Assessment/Plan:  Phyllis SciaraShekira Alexander is a 22 y.o. G3P1011 at 3625w5d [facility] with labor of di-di twin pregnancy #Labor:expectatn management. Progressing without use of augmentation.  #Pain: S/p fentanyl, stadol. Plan for epidural #FWB: FHR confirmed x2. Given previable status BMZ not warranted and no attempt at resucitation was discussed. Mother opted to have FHR checked so she would see it one more time.    Federico FlakeKimberly Niles Shanitha Twining, MD  10/22/2018, 4:11 PM

## 2018-10-22 NOTE — MAU Provider Note (Signed)
History     CSN: [id number]  Arrival date and time: 10/22/18 1515   None     Chief Complaint  Patient presents with  . Contractions   HPI   [name] is a 22 y.o. female G3P1011 Phyllis Alexander Di twins @ 1035w5d here with contractions. She says the contractions started at 1400 today and have been strong. She is feeling the contractions every 2-3 mintues. She is know PPROM and anhydramnio's X 2. Continues to have bright red bleeding that is not a new symptom.   OB History    Gravida  3   Para  1   Term  1   Preterm      AB  1   Living  1     SAB  0   TAB  1   Ectopic      Multiple      Live Births  1           Past Medical History:  Diagnosis Date  . Medical history non-contributory     Past Surgical History:  Procedure Laterality Date  . CESAREAN SECTION    . DILATION AND CURETTAGE OF UTERUS    . WISDOM TOOTH EXTRACTION      Family History  Problem Relation Age of Onset  . Diabetes Other   . ADD / ADHD Brother   . Hypertension Maternal Aunt   . Arthritis Maternal Grandmother   . Diabetes Maternal Grandmother   . Hypertension Maternal Grandmother   . Stroke Maternal Grandmother   . Arthritis Paternal Grandmother     Social History   Tobacco Use  . Smoking status: Former Smoker    Packs/day: 0.50    Types: Cigarettes    Last attempt to quit: 08/15/2018    Years since quitting: 0.1  . Smokeless tobacco: Never Used  Substance Use Topics  . Alcohol use: No  . Drug use: Never    Allergies: No Known Allergies  Medications Prior to Admission  Medication Sig Dispense Refill Last Dose  . ondansetron (ZOFRAN ODT) 8 MG disintegrating tablet Take 1 tablet (8 mg total) by mouth every 8 (eight) hours as needed for nausea or vomiting. (Patient not taking: Reported on 10/02/2018) 20 tablet 0 Not Taking  . Prenat-Fe Poly-Methfol-FA-DHA (VITAFOL ULTRA) 29-0.6-0.4-200 MG CAPS Take 1 tablet by mouth daily. (Patient not taking: Reported on 09/15/2018) 30  capsule 12 Not Taking   Results for orders placed or performed during the hospital encounter of 10/22/18 (from the past 48 hour(s))  CBC     Status: Abnormal   Collection Time: 10/22/18  3:39 PM  Result Value Ref Range   WBC 19.3 (H) 4.0 - 10.5 K/uL   RBC 3.42 (L) 3.87 - 5.11 MIL/uL   Hemoglobin 9.8 (L) 12.0 - 15.0 g/dL   HCT 65.729.8 (L) 84.636.0 - 96.246.0 %   MCV 87.1 80.0 - 100.0 fL   MCH 28.7 26.0 - 34.0 pg   MCHC 32.9 30.0 - 36.0 g/dL   RDW 95.212.8 84.111.5 - 32.415.5 %   Platelets 372 150 - 400 K/uL   nRBC 0.0 0.0 - 0.2 %    Comment: Performed at [facility], [address] [place], [place] [address]  RPR     Status: None   Collection Time: 10/22/18  3:39 PM  Result Value Ref Range   RPR Ser Ql Non Reactive Non Reactive    Comment: (NOTE) Performed At: [facility] [address] [place], [place] [id number] Phyllis RileyNagendra  Claudie Fisherman MD ZO:[id number]   Type and screen [facility] [place]     Status: None   Collection Time: 10/22/18  3:39 PM  Result Value Ref Range   ABO/RH(D) A POS    Antibody Screen NEG    Sample Expiration      10/25/2018 Performed at [facility], [address] [place], [place] [address]    Review of Systems  Constitutional: Negative for fever.  Gastrointestinal: Positive for abdominal pain and nausea.  Genitourinary: Positive for vaginal bleeding.   Physical Exam   Blood pressure 109/68, pulse 83, temperature 98 F (36.7 C), temperature source Oral, resp. rate 16, height 5\' 8"  (1.727 m), weight 67.7 kg, last menstrual period 06/22/2018, SpO2 100 %, unknown if currently breastfeeding.  Physical Exam  Constitutional: She is oriented to person, place, and time. She appears well-developed. She appears distressed.  HENT:  Head: Normocephalic.  Genitourinary:    Genitourinary Comments: Dilation: (unable to say what cervical dialaton is due to fetal parts ) Exam by:: Venia Carbon, NP    Musculoskeletal: Normal range of motion.  Neurological:  She is alert and oriented to person, place, and time.   MAU Course  Procedures None  MDM  Patient actively laboring @ 20 weeks, Phyllis Eon twins. Fetal parts in the vagina. Discussed admission with Dr. Alvester Morin. Patient desires epidural.  100 mcg of Fentanyl given IV prior to transfer.   Assessment and Plan   A:  1. Dichorionic diamniotic twin pregnancy, antepartum   2. Active preterm labor, single or unspecified fetus   3. Supervision of other normal pregnancy, antepartum   4. Previous cesarean section complicating pregnancy, antepartum condition or complication     P:  Admit to labor and delivery for active labor   Burlie Cajamarca, Harolyn Rutherford, NP 10/23/2018 9:26 AM

## 2018-10-22 NOTE — Anesthesia Procedure Notes (Signed)
Epidural Patient location during procedure: OB Start time: 10/22/2018 4:23 PM End time: 10/22/2018 4:34 PM  Staffing Anesthesiologist: Lowella CurbMiller, Warren Ray, MD Performed: anesthesiologist   Preanesthetic Checklist Completed: patient identified, site marked, surgical consent, pre-op evaluation, timeout performed, IV checked, risks and benefits discussed and monitors and equipment checked  Epidural Patient position: sitting Prep: ChloraPrep Patient monitoring: heart rate, cardiac monitor, continuous pulse ox and blood pressure Approach: midline Location: L2-L3 Injection technique: LOR saline  Needle:  Needle type: Tuohy  Needle gauge: 17 G Needle length: 9 cm Needle insertion depth: 5 cm Catheter type: closed end flexible Catheter size: 20 Guage Catheter at skin depth: 9 cm Test dose: negative  Assessment Events: blood not aspirated, injection not painful, no injection resistance, negative IV test and no paresthesia  Additional Notes Reason for block:procedure for pain

## 2018-10-22 NOTE — MAU Note (Signed)
Urine sent to lab 

## 2018-10-22 NOTE — Progress Notes (Signed)
Second placenta delivered whole and intact with minimal traction. Minimal vaginal bleeding noted. Patient has her partner and grandmother at her bedside  Patient will be transferred to postpartum for routine postpartum care

## 2018-10-22 NOTE — Progress Notes (Signed)
I offered grief support to family while they held their babies, Phyllis Alexander and Phyllis Alexander.  With the nursing staff, I offered a blessing and a baptism at the family's request.  Phyllis Alexander and her SO, Thayer OhmChris, were supporting each other through this incredibly difficult time.  Spiritual Care will attempt to follow up tomorrow to offer additional support.  Chaplain Dyanne CarrelKaty Jakyle Petrucelli, Bcc Pager, [phone] 6:26 PM    10/22/18 1800  Clinical Encounter Type  Visited With Patient and family together  Visit Type Spiritual support  Referral From Nurse  Spiritual Encounters  Spiritual Needs Prayer;Ritual;Grief support  Stress Factors  Patient Stress Factors Loss

## 2018-10-22 NOTE — MAU Note (Signed)
Pt states that she has been bleeding the entire pregnancy.   Pt states she started having ctx's at 1400  Pt states yesterday she was told her water was broke

## 2018-10-23 ENCOUNTER — Encounter: Payer: 59 | Admitting: Obstetrics and Gynecology

## 2018-10-23 ENCOUNTER — Other Ambulatory Visit: Payer: Self-pay

## 2018-10-23 ENCOUNTER — Encounter (HOSPITAL_COMMUNITY): Payer: Self-pay | Admitting: *Deleted

## 2018-10-23 DIAGNOSIS — Z3A20 20 weeks gestation of pregnancy: Secondary | ICD-10-CM

## 2018-10-23 DIAGNOSIS — O42012 Preterm premature rupture of membranes, onset of labor within 24 hours of rupture, second trimester: Secondary | ICD-10-CM

## 2018-10-23 DIAGNOSIS — O30042 Twin pregnancy, dichorionic/diamniotic, second trimester: Secondary | ICD-10-CM

## 2018-10-23 LAB — AFP, SERUM, OPEN SPINA BIFIDA
AFP MoM: 4.36
AFP Value: 285.6 ng/mL
Gest. Age on Collection Date: 20.4 weeks
MATERNAL AGE AT EDD: 25 a
OSBR RISK 1 IN: 219
Test Results:: NEGATIVE
Weight: 150 [lb_av]

## 2018-10-23 LAB — RPR: RPR Ser Ql: NONREACTIVE

## 2018-10-23 MED ORDER — IBUPROFEN 600 MG PO TABS
600.0000 mg | ORAL_TABLET | Freq: Four times a day (QID) | ORAL | Status: DC
  Administered 2018-10-23 (×3): 600 mg via ORAL
  Filled 2018-10-23 (×3): qty 1

## 2018-10-23 MED ORDER — ONDANSETRON HCL 4 MG PO TABS: 4.0000 mg | ORAL_TABLET | ORAL | Status: DC | PRN

## 2018-10-23 MED ORDER — ONDANSETRON HCL 4 MG/2ML IJ SOLN: 4.0000 mg | INTRAMUSCULAR | Status: DC | PRN

## 2018-10-23 MED ORDER — PRENATAL MULTIVITAMIN CH
1.0000 | ORAL_TABLET | Freq: Every day | ORAL | Status: DC
  Administered 2018-10-23: 1 via ORAL
  Filled 2018-10-23: qty 1

## 2018-10-23 MED ORDER — WITCH HAZEL-GLYCERIN EX PADS: 1.0000 "application " | MEDICATED_PAD | CUTANEOUS | Status: DC | PRN

## 2018-10-23 MED ORDER — OXYTOCIN 40 UNITS IN LACTATED RINGERS INFUSION - SIMPLE MED: 2.5000 [IU]/h | INTRAVENOUS | Status: DC | PRN

## 2018-10-23 MED ORDER — IBUPROFEN 600 MG PO TABS: 600.0000 mg | ORAL_TABLET | Freq: Four times a day (QID) | ORAL | 2 refills | Status: DC | PRN

## 2018-10-23 MED ORDER — BENZOCAINE-MENTHOL 20-0.5 % EX AERO: 1.0000 "application " | INHALATION_SPRAY | CUTANEOUS | Status: DC | PRN

## 2018-10-23 MED ORDER — ACETAMINOPHEN 325 MG PO TABS: 650.0000 mg | ORAL_TABLET | ORAL | Status: DC | PRN

## 2018-10-23 MED ORDER — DIBUCAINE 1 % RE OINT: 1.0000 "application " | TOPICAL_OINTMENT | RECTAL | Status: DC | PRN

## 2018-10-23 MED ORDER — ZOLPIDEM TARTRATE 5 MG PO TABS: 5.0000 mg | ORAL_TABLET | Freq: Every evening | ORAL | Status: DC | PRN

## 2018-10-23 MED ORDER — COCONUT OIL OIL: 1.0000 "application " | TOPICAL_OIL | Status: DC | PRN

## 2018-10-23 MED ORDER — DIPHENHYDRAMINE HCL 25 MG PO CAPS: 25.0000 mg | ORAL_CAPSULE | Freq: Four times a day (QID) | ORAL | Status: DC | PRN

## 2018-10-23 MED ORDER — SIMETHICONE 80 MG PO CHEW: 80.0000 mg | CHEWABLE_TABLET | ORAL | Status: DC | PRN

## 2018-10-23 MED ORDER — SENNOSIDES-DOCUSATE SODIUM 8.6-50 MG PO TABS
2.0000 | ORAL_TABLET | ORAL | Status: DC
  Administered 2018-10-23: 2 via ORAL
  Filled 2018-10-23: qty 2

## 2018-10-23 NOTE — Progress Notes (Signed)
Discharge teaching complete with pt. Pt understood all information and did not have any questions. Pt discharged home with family.  

## 2018-10-23 NOTE — Discharge Summary (Signed)
Postpartum Discharge Summary     Patient Name: Phyllis Alexander DOB: 1996-04-12 MRN: [id number]  Date of admission: 10/22/2018 Delivering Provider:    Dan HumphreysWalker, Girl Kristeen [[id number]][[id number]]  COX, Wilford GristJENNIFER B   Wollam, GirlB Ceaira [[id number]][[id number]]  Lyndel SafeNEWTON, KIMBERLY NILES   Date of discharge: 10/23/2018  Admitting diagnosis: 20 WKS, CTX Intrauterine pregnancy: 1954w5d     Secondary diagnosis:  Principal Problem:   Premature rupture of membranes Active Problems:   Supervision of other normal pregnancy, antepartum   Dichorionic diamniotic twin pregnancy, antepartum   Previous cesarean section complicating pregnancy, antepartum condition or complication   Postpartum hemorrhage   Vaginal delivery following previous cesarean section, delivered  Additional problems: Neonatal demise of previable infants     Discharge diagnosis: Preterm Pregnancy Delivered                                                                                                Post partum procedures:None  Augmentation: None  Complications: None  Hospital course:  Onset of Labor With Vaginal Delivery     22 y.o. yo [phone] at 154w5d was admitted in Active Labor after prolonged PPROM for twin gestation at 4854w5d on 10/22/2018. Patient had an uncomplicated labor course as follows:  Membrane Rupture Time/Date:    Dan HumphreysWalker, Girl [facility] [[id number]][[id number]]      Curtis SitesWalker, GirlB [place] [[id number]][[id number]]    ,   Dan HumphreysWalker, Girl Staci [[id number]][[id number]]      Curtis SitesWalker, GirlB [place]ira [[id number]][[id number]]      Intrapartum Procedures: Episiotomy:    Lillia CarmelWalker, Girl Angelli [[id number]][[id number]]  None [1]   Eula FriedWalker, GirlB Mixtli [[id number]][[id number]]  None [1]                                         Lacerations:     Lillia CarmelWalker, Girl Thereasa [[id number]][[id number]]  None [1]   Eula FriedWalker, GirlB Aleesia [[id number]][[id number]]  None [1]  Patient had a delivery of a Non Viable infants.   Dan HumphreysWalker, Girl Marijo FileShekira [[id number]][[id number]]  10/22/2018   Eula FriedWalker, GirlB Shahla [[id number]][[id number]]  10/22/2018 Information for  the patient's newborn:  Lillia CarmelWalker, Girl Enedina [[id number]][[id number]]  Delivery Method: Vaginal, Spontaneous(Filed from Delivery Summary) Information for the patient's newborn:  Eula FriedWalker, GirlB Veronnica [[id number]][[id number]]  Delivery Method: Vaginal, Spontaneous(Filed from Delivery Summary)   Pateint had an uncomplicated postpartum course.  She is ambulating, tolerating a regular diet, passing flatus, and urinating well. Patient is discharged home in stable condition on 10/23/18.   Physical exam  Vitals:   10/22/18 2345 10/23/18 0049 10/23/18 0440 10/23/18 0857  BP: 117/71 122/89 (!) 93/47 109/68  Pulse: 86 94 78 83  Resp: 16 17 16 16   Temp: 99 F (37.2 C) 98.9 F (37.2 C) 98.4 F (36.9 C) 98 F (36.7 C)  TempSrc: Oral Oral Oral Oral  SpO2: 100% 99% 99% 100%  Weight:      Height:       General: alert, cooperative and no distress Lochia: appropriate Uterine Fundus: firm Incision: N/A DVT Evaluation: No evidence of DVT seen on  physical exam. Negative Homan's sign. No cords or calf tenderness. Labs: Lab Results  Component Value Date   WBC 19.3 (H) 10/22/2018   HGB 9.8 (L) 10/22/2018   HCT 29.8 (L) 10/22/2018   MCV 87.1 10/22/2018   PLT 372 10/22/2018   CMP Latest Ref Rng & Units 08/22/2018  Glucose 70 - 99 mg/dL 81  BUN 6 - 20 mg/dL 6  Creatinine 1.61 - 0.96 mg/dL 0.45  Sodium 409 - 811 mmol/L 135  Potassium 3.5 - 5.1 mmol/L 3.9  Chloride 98 - 111 mmol/L 108  CO2 22 - 32 mmol/L 21(L)  Calcium 8.9 - 10.3 mg/dL 9.1  Total Protein 6.5 - 8.1 g/dL 6.1(L)  Total Bilirubin 0.3 - 1.2 mg/dL 0.5  Alkaline Phos 38 - 126 U/L 28(L)  AST 15 - 41 U/L 15  ALT 0 - 44 U/L 8    Discharge instruction: per After Visit Summary and "Baby and Me Booklet".  After visit meds:  Allergies as of 10/23/2018   No Known Allergies     Medication List    TAKE these medications   ibuprofen 600 MG tablet Commonly known as:  ADVIL,MOTRIN Take 1 tablet (600 mg total) by mouth every 6 (six) hours as needed.        Diet: routine diet  Activity: Advance as tolerated. Pelvic rest for 6 weeks.    Future Appointments  Date Time Provider Department Center  10/23/2018  3:30 PM Conan Bowens, MD [facility] None  10/30/2018  1:30 PM [facility] [place] 1 [facility]  11/03/2018  2:30 PM Hermina Staggers, MD [facility] None    Newborn Data:   Kailoni, Vahle [[id number]]  Live born female  Birth Weight: 10 oz (283 g) APGAR: 6, 6  Newborn Delivery   Birth date/time:  10/22/2018 16:35:00 Delivery type:  Vaginal, Spontaneous      Undrea, Archbold [[id number]]  Live born female  Birth Weight: 10.2 oz (289 g) APGAR: 1, 1  Newborn Delivery   Birth date/time:  10/22/2018 16:53:00 Delivery type:  Vaginal, Spontaneous    Disposition:morgue   10/23/2018 Jaynie Collins, MD

## 2018-10-23 NOTE — Anesthesia Postprocedure Evaluation (Signed)
Anesthesia Post Note  Patient: Phyllis SciaraShekira Alexander  Procedure(s) Performed: AN AD HOC LABOR EPIDURAL     Patient location during evaluation: [facility] Anesthesia Type: Epidural Level of consciousness: awake and alert Pain management: pain level controlled Vital Signs Assessment: post-procedure vital signs reviewed and stable Respiratory status: spontaneous breathing, nonlabored ventilation and respiratory function stable Cardiovascular status: stable Postop Assessment: no headache, no backache and epidural receding Anesthetic complications: no    Last Vitals:  Vitals:   10/23/18 0049 10/23/18 0440  BP: 122/89 (!) 93/47  Pulse: 94 78  Resp: 17 16  Temp: 37.2 C 36.9 C  SpO2: 99% 99%    Last Pain:  Vitals:   10/23/18 0730  TempSrc:   PainSc: 0-No pain   Pain Goal: Patients Stated Pain Goal: 0 (10/23/18 0730)               Junious SilkGILBERT,Lorin Hauck

## 2018-10-23 NOTE — Discharge Instructions (Signed)
Vaginal Delivery, Care After °Refer to this sheet in the next few weeks. These instructions provide you with information on caring for yourself after your delivery. Your health care provider may also give you more specific instructions. Your treatment has been planned according to current medical practices, but problems sometimes occur. Call your health care provider if you have any problems or questions after your delivery. °What to expect after your delivery °After your delivery, it is typical to have the following: °· You may feel pain in the vaginal area for several days after delivery. If you had an incision or a vaginal tear, the area will probably continue to be tender to the touch for several weeks. °· You may feel very fatigued after a vaginal delivery. °· You may have vaginal bleeding and discharge that will start out red, then become pink, then yellow, then white. Altogether, this usually lasts for about 6 weeks. °· The combination of having lost your baby and changing hormones from the delivery can make you feel very sad. You may also experience emotions that change very quickly. Some of the emotions people often notice after loss include: °? Anger. °? Denial. °? Guilt. °? Sorrow. °? Depression. °? Grief. °? Relationship problems. ° °Follow these instructions at home: °· Consider seeking support for your loss. Some forms of support that you might consider include your religious leader, friends, family, a professional counselor, or a bereavement support group. °· Take medicines only as directed by your health care provider. °· Continue to use good perineal care. Good perineal care includes: °? Wiping your perineum from front to back. °? Keeping your perineum clean. °· Do not use tampons or douche until your health care provider says it is okay. °· Shower, wash your hair, and take tub baths as directed by your health care provider. °· Wear a well-fitting bra that provides breast support. °· Drink enough  fluids to keep your urine clear or pale yellow. °· Eat healthy foods. °· Eat high-fiber foods every day, such as whole grain cereals and breads, brown rice, beans, and fresh fruits and vegetables. These foods may help prevent or relieve constipation. °· Follow your health care provider's directions about resuming activities such as climbing stairs, driving, lifting, exercising, or traveling. °· Increase your activities gradually. °· Talk to your health care provider about resuming sexual activities. This depends on your risk of infection, your rate of healing, and your comfort and desire to resume sexual activity. °· Try to have someone help you with your household activities for at least a few days after you leave the hospital. °· Rest as much as possible. °· Keep all of your scheduled postpartum appointments. It is very important to keep your scheduled follow-up appointments. At these appointments, your health care provider will be checking to make sure that you are healing physically and emotionally. °· Do not drink alcohol, especially if you are taking medicine to relieve pain. °· Do not use any tobacco products including cigarettes, chewing tobacco, or electronic cigarettes. If you need help quitting, ask your health care provider. °· Do not use illegal drugs. °Contact a health care provider if: °· You feel sad or depressed. °· You have thoughts of hurting yourself. °· You are having trouble eating or sleeping. °· You cannot enjoy the things in life you have previously enjoyed. °· You are passing large clots from your vagina. Save any clots to show your health care provider. °· You have a bad smelling discharge from your vagina. °·   You have trouble urinating. °· You are urinating frequently. °· You have pain when you urinate. °· You have a change in your bowel movements. °· You have increasing redness, pain, or swelling near your incision or vaginal tear. °· You have pus draining from your incision or vaginal  tear. °· Your incision or vaginal tear is separating. °· You have painful, hard, or reddened breasts. °· You have a severe headache. °· You have blurred vision or see spots. °· You are dizzy or light-headed. °· You have a rash. °· You have nausea or vomiting. °· You have not had a menstrual period by the 12th week after delivery. °· You have a fever. °Get help right away if: °· You are concerned that you may hurt yourself or you are considering suicide. °· You have persistent pain. °· You have chest pain. °· You have shortness of breath. °· You faint. °· You have leg pain. °· You have stomach pain. °· Your vaginal bleeding saturates two or more sanitary pads in 1 hour. °This information is not intended to replace advice given to you by your health care provider. Make sure you discuss any questions you have with your health care provider. °Document Released: 03/15/2014 Document Revised: 04/05/2016 Document Reviewed: 12/17/2013 °Elsevier Interactive Patient Education © 2018 [employer] ° °

## 2018-10-23 NOTE — Lactation Note (Signed)
Lactation Consultation Note;Inital visit with mom of 20 week twins who are deceased. Reviewed information about breast changes after delivery. Care of breasts after loss information left for mom. No questions at present.   Patient Name: Dorita SciaraShekira Dunavant ZOXWR'UToday's Date: 10/23/2018     Maternal Data    Feeding    LATCH Score                   Interventions    Lactation Tools Discussed/Used     Consult Status      Pamelia HoitWeeks, Jatinder Mcdonagh D 10/23/2018, 7:43 AM

## 2018-10-30 ENCOUNTER — Encounter (HOSPITAL_COMMUNITY): Payer: Self-pay

## 2018-10-30 ENCOUNTER — Ambulatory Visit (HOSPITAL_COMMUNITY): Payer: 59

## 2018-11-03 ENCOUNTER — Ambulatory Visit: Payer: 59 | Admitting: Obstetrics and Gynecology

## 2018-11-12 HISTORY — DX: Maternal care for unspecified type scar from previous cesarean delivery: O34.219

## 2018-11-13 NOTE — Progress Notes (Signed)
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  Pt did not answer the phone.  Left message indicating support, but with no other identifying information.  Please page as further needs arise.  Maryanna Shape. Carley Hammed, M.Div. [facility] Chaplain Pager [phone] Office [phone]

## 2018-11-20 ENCOUNTER — Ambulatory Visit: Payer: 59 | Admitting: Obstetrics & Gynecology

## 2018-12-02 ENCOUNTER — Encounter: Payer: Self-pay | Admitting: Advanced Practice Midwife

## 2018-12-02 ENCOUNTER — Ambulatory Visit (INDEPENDENT_AMBULATORY_CARE_PROVIDER_SITE_OTHER): Payer: 59 | Admitting: Obstetrics & Gynecology

## 2018-12-02 DIAGNOSIS — Z3202 Encounter for pregnancy test, result negative: Secondary | ICD-10-CM | POA: Diagnosis not present

## 2018-12-02 DIAGNOSIS — M545 Low back pain, unspecified: Secondary | ICD-10-CM

## 2018-12-02 DIAGNOSIS — O42012 Preterm premature rupture of membranes, onset of labor within 24 hours of rupture, second trimester: Secondary | ICD-10-CM | POA: Insufficient documentation

## 2018-12-02 LAB — POCT URINE PREGNANCY: Preg Test, Ur: POSITIVE — AB

## 2018-12-02 MED ORDER — CYCLOBENZAPRINE HCL 10 MG PO TABS: 10.0000 mg | ORAL_TABLET | Freq: Three times a day (TID) | ORAL | 1 refills | Status: DC | PRN

## 2018-12-02 NOTE — Patient Instructions (Signed)
Return to clinic for any scheduled appointments or obstetric concerns, or go to [facility] for evaluation  

## 2018-12-02 NOTE — Progress Notes (Signed)
     Subjective:     Phyllis Alexander is a 23 y.o. G55P1011 female who presents for a postpartum visit. She is 5 weeks postpartum following a spontaneous vaginal deliver after PPROM at [redacted]w[redacted]d with twin gestation. I have fully reviewed the prenatal and intrapartum course. Anesthesia: none. Postpartum course has been complicated by some mild back pain at epidural site.  Baby's course has been uncomplicated. Bleeding moderate lochia. Bowel function is normal. Bladder function is normal. Patient is sexually active, had unprotected IC last week. Contraception method is none, desires Depo Provera.  Postpartum depression screening: negative. She feels she has adequate home support.  The following portions of the patient's history were reviewed and updated as appropriate: allergies, current medications, past family history, past medical history, past social history, past surgical history and problem list.  Review of Systems Pertinent items noted in HPI and remainder of comprehensive ROS otherwise negative.   Objective:    BP (!) 95/59   Pulse 83   Ht 5\' 8"  (1.727 m)   Wt 140 lb (63.5 kg)   LMP 06/22/2018 (Approximate)   BMI 21.29 kg/m   General:  alert and no distress   Breasts:  deferred  Lungs: Normal breath sounds  Heart:  regular rate noted  Abdomen: soft, non-tender; bowel sounds normal; no masses,  no organomegaly   Pelvic:  not evaluated        Results for orders placed or performed in visit on 12/02/18 (from the past 24 hour(s))  POCT urine pregnancy     Status: Abnormal   Collection Time: 12/02/18 11:50 AM  Result Value Ref Range   Preg Test, Ur Positive (A) Negative  Second line very faintly positive  Assessment and Plan:   Patient is here for postpartum examination. Desires Depo Provera, but equivocal UPT.  Advised to avoid unprotected IC, and we will reevaluate in two weeks. If negative then, will give Depo Provera as desired. Advised to take vitamins with folic acid, in case  this signifies an early pregnancy (as opposed to residual low HCG after recent twin gestation loss at 20 weeks).    Patient is otherwise doing well. Coping well after the loss. Declines any ancillary support services.  Flexeril prescribed as needed for back pain, advised to take NSAIDs as needed.  She was told to let us know if symptoms are not improving at next visit or earlier if needed.  Return in about 2 weeks (around 12/16/2018) for RN visit: Preg test and Depo provera if indicated.   Jaynie Collins, MD, FACOG Obstetrician & Gynecologist, [facility] for [employer], [facility]

## 2018-12-12 ENCOUNTER — Emergency Department (HOSPITAL_COMMUNITY)
Admission: EM | Admit: 2018-12-12 | Discharge: 2018-12-12 | Disposition: A | Discharge status: Home / Self Care | Payer: 59 | Attending: Emergency Medicine | Admitting: Emergency Medicine

## 2018-12-12 ENCOUNTER — Other Ambulatory Visit: Payer: Self-pay

## 2018-12-12 ENCOUNTER — Emergency Department (HOSPITAL_COMMUNITY): Payer: 59

## 2018-12-12 DIAGNOSIS — Y939 Activity, unspecified: Secondary | ICD-10-CM | POA: Insufficient documentation

## 2018-12-12 DIAGNOSIS — R519 Headache, unspecified: Secondary | ICD-10-CM

## 2018-12-12 DIAGNOSIS — R51 Headache: Secondary | ICD-10-CM | POA: Diagnosis not present

## 2018-12-12 DIAGNOSIS — Y92410 Unspecified street and highway as the place of occurrence of the external cause: Secondary | ICD-10-CM | POA: Diagnosis not present

## 2018-12-12 DIAGNOSIS — Y998 Other external cause status: Secondary | ICD-10-CM | POA: Diagnosis not present

## 2018-12-12 DIAGNOSIS — Z87891 Personal history of nicotine dependence: Secondary | ICD-10-CM | POA: Diagnosis not present

## 2018-12-12 DIAGNOSIS — M542 Cervicalgia: Secondary | ICD-10-CM | POA: Diagnosis not present

## 2018-12-12 LAB — POC URINE PREG, ED: Preg Test, Ur: NEGATIVE

## 2018-12-12 MED ORDER — METHOCARBAMOL 750 MG PO TABS: 750.0000 mg | ORAL_TABLET | Freq: Three times a day (TID) | ORAL | 0 refills | Status: DC | PRN
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
## 2018-12-12 NOTE — ED Notes (Signed)
Patient transported to X-ray 

## 2018-12-12 NOTE — ED Notes (Signed)
Discharge instructions and prescription discussed with Pt. Pt verbalized understanding. Pt stable and ambulatory.    
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
## 2018-12-16 ENCOUNTER — Ambulatory Visit (INDEPENDENT_AMBULATORY_CARE_PROVIDER_SITE_OTHER): Payer: 59 | Admitting: Advanced Practice Midwife

## 2018-12-16 VITALS — BP 124/85 | HR 79 | Resp 16 | Ht 68.0 in | Wt 138.0 lb

## 2018-12-16 DIAGNOSIS — Z3201 Encounter for pregnancy test, result positive: Secondary | ICD-10-CM

## 2018-12-16 DIAGNOSIS — Z01818 Encounter for other preprocedural examination: Secondary | ICD-10-CM

## 2018-12-16 DIAGNOSIS — Z3200 Encounter for pregnancy test, result unknown: Secondary | ICD-10-CM

## 2018-12-16 LAB — POCT URINE PREGNANCY: PREG TEST UR: POSITIVE — AB

## 2018-12-16 MED ORDER — MEDROXYPROGESTERONE ACETATE 150 MG/ML IM SUSP: 150.0000 mg | Freq: Once | INTRAMUSCULAR | Status: DC

## 2018-12-16 NOTE — Progress Notes (Signed)
Ms. Kimbel presents today for UPT. She has no unusual complaints. LMP: 11/12/2018 EDD: 08/19/2019    OBJECTIVE: Appears well, in no apparent distress.  OB History    Gravida  3   Para  2   Term  1   Preterm  0   AB  1   Living  1     SAB  0   TAB  1   Ectopic      Multiple  1   Live Births  3        Obstetric Comments  Previable twins delivery with PPROM       Home UPT Result: Has not taken one In-Office UPT result: Positive I have reviewed the patient's medical, obstetrical, social, and family histories, and medications.   ASSESSMENT: Positive pregnancy test  PLAN:  Prenatal care to be completed at: [facility] Will do lab work to confirm pregnancy as well. Begin taking Prenatal vitamins. Schedule Initial OB visit between 10-12 weeks.

## 2018-12-16 NOTE — Progress Notes (Signed)
I reviewed this pt chart with positive pregnancy test today.  I agree with the nurse's assessment and plan of care.

## 2018-12-17 LAB — BETA HCG QUANT (REF LAB): hCG Quant: 12 m[IU]/mL

## 2018-12-18 ENCOUNTER — Telehealth: Payer: Self-pay | Admitting: Advanced Practice Midwife

## 2018-12-18 NOTE — Telephone Encounter (Signed)
Left message for pt to call [facility] tomorrow morning regarding lab results.  Pt had positive pregnancy test on Tuesday, 12/16/18, which may represent new pregnancy or may still be elevated due to recent delivery. She needs repeat hcg on Friday, 12/19/18. I sent message to clinical pool and I called th pt and left a message to call back regarding lab results. Thank you.

## 2018-12-19 ENCOUNTER — Other Ambulatory Visit: Payer: 59

## 2018-12-19 ENCOUNTER — Telehealth: Payer: Self-pay | Admitting: Advanced Practice Midwife

## 2018-12-19 ENCOUNTER — Other Ambulatory Visit: Payer: Self-pay

## 2018-12-19 DIAGNOSIS — O3680X1 Pregnancy with inconclusive fetal viability, fetus 1: Secondary | ICD-10-CM

## 2018-12-19 NOTE — Telephone Encounter (Signed)
Phyllis Alexander was returning call to [place].  Discussed with patient, [name]'s plan.  Patient will return today for repeat BHCG.

## 2018-12-20 LAB — BETA HCG QUANT (REF LAB): HCG QUANT: 10 m[IU]/mL

## 2018-12-30 ENCOUNTER — Other Ambulatory Visit: Payer: Medicaid Other

## 2018-12-30 DIAGNOSIS — Z09 Encounter for follow-up examination after completed treatment for conditions other than malignant neoplasm: Secondary | ICD-10-CM

## 2018-12-31 LAB — BETA HCG QUANT (REF LAB): hCG Quant: 8 m[IU]/mL

## 2019-01-03 ENCOUNTER — Telehealth: Payer: Self-pay | Admitting: Advanced Practice Midwife

## 2019-01-03 NOTE — Telephone Encounter (Signed)
Called pt to review dropping hcg levels.  Pt presented to the office 12/16/18 for Depo injection.  Pt had positive pregnancy test in the office and hcg of 12 on 12/16/18, followed by hcg of 10 on 2/7 and 8 on 2/18.  Consult Dr Debroah Loop on 01/03/19 regarding hcg levels and follow up plan.  Ok to offer Depo Provera with dropping hcg down to 8.   Spoke with pt, notified her of dropping hcg levels. Pt states understanding.  Message sent to [facility] to schedule nurse/lab visit for Depo and hcg level.

## 2019-01-07 ENCOUNTER — Ambulatory Visit (INDEPENDENT_AMBULATORY_CARE_PROVIDER_SITE_OTHER): Payer: Medicaid Other

## 2019-01-07 ENCOUNTER — Other Ambulatory Visit: Payer: Self-pay

## 2019-01-07 ENCOUNTER — Other Ambulatory Visit: Payer: Medicaid Other

## 2019-01-07 VITALS — BP 108/71 | HR 84 | Wt 141.0 lb

## 2019-01-07 DIAGNOSIS — Z3042 Encounter for surveillance of injectable contraceptive: Secondary | ICD-10-CM | POA: Diagnosis not present

## 2019-01-07 DIAGNOSIS — O3680X1 Pregnancy with inconclusive fetal viability, fetus 1: Secondary | ICD-10-CM

## 2019-01-07 MED ORDER — MEDROXYPROGESTERONE ACETATE 150 MG/ML IM SUSP
150.0000 mg | Freq: Once | INTRAMUSCULAR | Status: AC
  Administered 2019-01-07: 150 mg via INTRAMUSCULAR

## 2019-01-07 MED ORDER — MEDROXYPROGESTERONE ACETATE 150 MG/ML IM SUSP: 150.0000 mg | INTRAMUSCULAR | 3 refills | Status: DC

## 2019-01-07 NOTE — Progress Notes (Signed)
Presents for DEPO, given in ld, tolerated well. Next DEPO May 14-28, 2020.  Administrations This Visit    medroxyPROGESTERone (DEPO-PROVERA) injection 150 mg    Admin Date 01/07/2019 Action Given Dose 150 mg Route Intramuscular Administered By Maretta Bees, RMA

## 2019-01-08 LAB — BETA HCG QUANT (REF LAB): hCG Quant: 7 m[IU]/mL

## 2019-01-28 ENCOUNTER — Encounter: Payer: 59 | Admitting: Obstetrics and Gynecology

## 2019-03-31 ENCOUNTER — Ambulatory Visit: Payer: Medicaid Other

## 2019-04-09 ENCOUNTER — Other Ambulatory Visit: Payer: Self-pay

## 2019-04-09 ENCOUNTER — Ambulatory Visit: Payer: Medicaid Other

## 2019-08-25 ENCOUNTER — Ambulatory Visit (HOSPITAL_COMMUNITY)
Admission: EM | Admit: 2019-08-25 | Discharge: 2019-08-25 | Disposition: A | Discharge status: Home / Self Care | Payer: BC Managed Care – PPO | Attending: Emergency Medicine | Admitting: Emergency Medicine

## 2019-08-25 ENCOUNTER — Encounter (HOSPITAL_COMMUNITY): Payer: Self-pay

## 2019-08-25 DIAGNOSIS — R519 Headache, unspecified: Secondary | ICD-10-CM | POA: Diagnosis not present

## 2019-08-25 DIAGNOSIS — R0981 Nasal congestion: Secondary | ICD-10-CM

## 2019-08-25 DIAGNOSIS — Z3A01 Less than 8 weeks gestation of pregnancy: Secondary | ICD-10-CM | POA: Diagnosis present

## 2019-08-25 DIAGNOSIS — Z20828 Contact with and (suspected) exposure to other viral communicable diseases: Secondary | ICD-10-CM | POA: Diagnosis not present

## 2019-08-25 DIAGNOSIS — R05 Cough: Secondary | ICD-10-CM

## 2019-08-25 DIAGNOSIS — Z1159 Encounter for screening for other viral diseases: Secondary | ICD-10-CM

## 2019-08-25 LAB — POCT PREGNANCY, URINE: Preg Test, Ur: POSITIVE — AB

## 2019-08-25 NOTE — Discharge Instructions (Addendum)
You are pregnant.  Start taking prenatal vitamins today.  Schedule an appointment with your OB/GYN or the one suggested below.    Your COVID test is pending.  You should self quarantine until your test result is back and is negative.    Go to the emergency department if you develop high fever, shortness of breath, severe diarrhea, or other concerning symptoms.

## 2019-08-25 NOTE — ED Provider Notes (Signed)
[facility]    CSN: [id number] Arrival date & time: 08/25/19  1125      History   Chief Complaint Chief Complaint  Patient presents with  . Fatigue  . Nasal Congestion  . Headache  . Cough  . Nausea    HPI Phyllis Alexander is a 23 y.o. female.   Patient presents with nausea, headache, nasal congestion, nonproductive cough, and fatigue x 3 days.  She denies fever, chills, shortness of breath, vomiting, diarrhea, rash, or other symptoms.  LMP: "Sometime in August".  Patient is sexually active and does not use contraceptives.     The history is provided by the patient.    Past Medical History:  Diagnosis Date  . Medical history non-contributory     There are no active problems to display for this patient.   Past Surgical History:  Procedure Laterality Date  . CESAREAN SECTION    . DILATION AND CURETTAGE OF UTERUS    . WISDOM TOOTH EXTRACTION      OB History    Gravida  4   Para  2   Term  1   Preterm  0   AB  1   Living  1     SAB  0   TAB  1   Ectopic      Multiple  1   Live Births  3        Obstetric Comments  Previable twins delivery with PPROM         Home Medications    Prior to Admission medications   Medication Sig Start Date End Date Taking? Authorizing Provider  amoxicillin (AMOXIL) 500 MG tablet TK 1 T PO TID UNTIL GONE 08/18/19   [provider]  chlorhexidine (PERIDEX) 0.12 % solution  08/21/19   [provider]  cyclobenzaprine (FLEXERIL) 10 MG tablet Take 1 tablet (10 mg total) by mouth every 8 (eight) hours as needed for muscle spasms. 12/02/18   Anyanwu, Jethro Bastos, MD  ibuprofen (ADVIL) 600 MG tablet TK 1 T PO Q 6 H PRN 08/21/19   [provider]  medroxyPROGESTERone (DEPO-PROVERA) 150 MG/ML injection Inject 1 mL (150 mg total) into the muscle every 3 (three) months. 01/07/19   Brock Bad, MD    Family History Family History  Problem Relation Age of Onset  . Diabetes Other   .  ADD / ADHD Brother   . Hypertension Maternal Aunt   . Arthritis Maternal Grandmother   . Diabetes Maternal Grandmother   . Hypertension Maternal Grandmother   . Stroke Maternal Grandmother   . Arthritis Paternal Grandmother     Social History Social History   Tobacco Use  . Smoking status: Former Smoker    Packs/day: 0.50    Types: Cigarettes    Quit date: 08/15/2018    Years since quitting: 1.0  . Smokeless tobacco: Never Used  Substance Use Topics  . Alcohol use: No  . Drug use: Never     Allergies   Patient has no known allergies.   Review of Systems Review of Systems  Constitutional: Positive for fatigue. Negative for chills and fever.  HENT: Positive for congestion. Negative for ear pain and sore throat.   Eyes: Negative for pain and visual disturbance.  Respiratory: Positive for cough. Negative for shortness of breath.   Cardiovascular: Negative for chest pain and palpitations.  Gastrointestinal: Positive for nausea. Negative for abdominal pain and vomiting.  Genitourinary: Negative for dysuria and hematuria.  Musculoskeletal: Negative for arthralgias and back pain.  Skin: Negative for color change and rash.  Neurological: Positive for headaches. Negative for seizures and syncope.  All other systems reviewed and are negative.    Physical Exam Triage Vital Signs ED Triage Vitals  Enc Vitals Group     BP 08/25/19 1159 101/60     Pulse Rate 08/25/19 1159 85     Resp 08/25/19 1159 15     Temp 08/25/19 1159 98.7 F (37.1 C)     Temp Source 08/25/19 1159 Temporal     SpO2 08/25/19 1159 100 %     Weight --      Height --      Head Circumference --      Peak Flow --      Pain Score 08/25/19 1156 2     Pain Loc --      Pain Edu? --      Excl. in GC? --    No data found.  Updated Vital Signs BP 101/60 (BP Location: Left Arm)   Pulse 85   Temp 98.7 F (37.1 C) (Temporal)   Resp 15   SpO2 100%   Visual Acuity Right Eye Distance:   Left Eye  Distance:   Bilateral Distance:    Right Eye Near:   Left Eye Near:    Bilateral Near:     Physical Exam Vitals signs and nursing note reviewed.  Constitutional:      General: She is not in acute distress.    Appearance: She is well-developed. She is not ill-appearing.  HENT:     Head: Normocephalic and atraumatic.     Right Ear: Tympanic membrane normal.     Left Ear: Tympanic membrane normal.     Nose: Nose normal.     Mouth/Throat:     Mouth: Mucous membranes are moist.     Pharynx: Oropharynx is clear.  Eyes:     Conjunctiva/sclera: Conjunctivae normal.  Neck:     Musculoskeletal: Neck supple.  Cardiovascular:     Rate and Rhythm: Normal rate and regular rhythm.     Heart sounds: No murmur.  Pulmonary:     Effort: Pulmonary effort is normal. No respiratory distress.     Breath sounds: Normal breath sounds.  Abdominal:     General: Bowel sounds are normal.     Palpations: Abdomen is soft.     Tenderness: There is no abdominal tenderness. There is no guarding or rebound.  Skin:    General: Skin is warm and dry.     Findings: No rash.  Neurological:     Mental Status: She is alert.      UC Treatments / Results  Labs (all labs ordered are listed, but only abnormal results are displayed) Labs Reviewed  POCT PREGNANCY, URINE - Abnormal; Notable for the following components:      Result Value   Preg Test, Ur POSITIVE (*)    All other components within normal limits  NOVEL CORONAVIRUS, NAA (HOSP ORDER, SEND-OUT TO REF LAB; TAT 18-24 HRS)  POC URINE PREG, ED    EKG   Radiology No results found.  Procedures Procedures (including critical care time)  Medications Ordered in UC Medications - No data to display  Initial Impression / Assessment and Plan / UC Course  I have reviewed the triage vital signs and the nursing notes.  Pertinent labs & imaging results that were available during my care of the patient were reviewed by me  and considered in my medical  decision making (see chart for details).    Pregnant.  Instructed patient to start prenatal vitamins and schedule appointment with her OB/GYN.  COVID test performed here.  Instructed patient to self quarantine until her test results are back.  Instructed patient to go to the emergency department if she develops high fever, shortness of breath, severe diarrhea, or other concerning symptoms.  Patient agrees with plan of care.   Final Clinical Impressions(s) / UC Diagnoses   Final diagnoses:  Less than [redacted] weeks gestation of pregnancy     Discharge Instructions     You are pregnant.  Start taking prenatal vitamins today.  Schedule an appointment with your OB/GYN or the one suggested below.    Your COVID test is pending.  You should self quarantine until your test result is back and is negative.    Go to the emergency department if you develop high fever, shortness of breath, severe diarrhea, or other concerning symptoms.       ED Prescriptions    None     PDMP not reviewed this encounter.   Sharion Balloon, NP 08/25/19 1236

## 2019-08-25 NOTE — ED Triage Notes (Signed)
Pt reports fatigue, headache, nasal congestion, nausea and cough x 3 days.

## 2019-08-26 LAB — NOVEL CORONAVIRUS, NAA (HOSP ORDER, SEND-OUT TO REF LAB; TAT 18-24 HRS): SARS-CoV-2, NAA: NOT DETECTED

## 2019-11-11 ENCOUNTER — Other Ambulatory Visit: Payer: Self-pay

## 2019-11-11 ENCOUNTER — Ambulatory Visit (INDEPENDENT_AMBULATORY_CARE_PROVIDER_SITE_OTHER): Payer: Medicaid Other | Admitting: Obstetrics & Gynecology

## 2019-11-11 ENCOUNTER — Other Ambulatory Visit (HOSPITAL_COMMUNITY)
Admission: RE | Admit: 2019-11-11 | Discharge: 2019-11-11 | Disposition: A | Discharge status: Home / Self Care | Payer: Medicaid Other | Source: Ambulatory Visit | Attending: Obstetrics & Gynecology | Admitting: Obstetrics & Gynecology

## 2019-11-11 ENCOUNTER — Encounter: Payer: Self-pay | Admitting: Obstetrics & Gynecology

## 2019-11-11 DIAGNOSIS — O34219 Maternal care for unspecified type scar from previous cesarean delivery: Secondary | ICD-10-CM

## 2019-11-11 DIAGNOSIS — O99012 Anemia complicating pregnancy, second trimester: Secondary | ICD-10-CM

## 2019-11-11 DIAGNOSIS — Z8759 Personal history of other complications of pregnancy, childbirth and the puerperium: Secondary | ICD-10-CM | POA: Insufficient documentation

## 2019-11-11 DIAGNOSIS — Z3490 Encounter for supervision of normal pregnancy, unspecified, unspecified trimester: Secondary | ICD-10-CM

## 2019-11-11 DIAGNOSIS — Z3481 Encounter for supervision of other normal pregnancy, first trimester: Secondary | ICD-10-CM | POA: Diagnosis not present

## 2019-11-11 DIAGNOSIS — Z3A19 19 weeks gestation of pregnancy: Secondary | ICD-10-CM

## 2019-11-11 DIAGNOSIS — O99019 Anemia complicating pregnancy, unspecified trimester: Secondary | ICD-10-CM

## 2019-11-11 HISTORY — DX: Encounter for supervision of normal pregnancy, unspecified, unspecified trimester: Z34.90

## 2019-11-11 MED ORDER — BLOOD PRESSURE CUFF MISC: 1.0000 | Freq: Every day | 0 refills | Status: DC

## 2019-11-11 NOTE — Progress Notes (Signed)
Pt presents for initial OB visit. Pt has no concerns.

## 2019-11-11 NOTE — Progress Notes (Unsigned)
Flu vaccine given in R deltoid without difficulty.

## 2019-11-11 NOTE — Progress Notes (Signed)
  Subjective:    Phyllis Alexander is being seen today for her first obstetrical visit.  This is not a planned pregnancy. She is at [redacted]w[redacted]d gestation. Her obstetrical history is significant for previous c-section . Marland Kitchen Relationship with FOB: significant other, not living together. Patient does intend to breast feed. Pregnancy history fully reviewed.  Patient reports no complaints.  Review of Systems:   Review of Systems  Objective:     BP 115/71   Pulse 82   Wt 143 lb 6.4 oz (65 kg)   LMP 06/29/2019 (Exact Date)   BMI 21.80 kg/m  Physical Exam  Exam General Appearance:    Alert, cooperative, no distress, appears stated age  Head:    Normocephalic, without obvious abnormality, atraumatic  Eyes:    conjunctiva/corneas clear, EOM's intact, both eyes  Ears:    Normal external ear canals, both ears  Nose:   Nares normal, septum midline, mucosa normal, no drainage    or sinus tenderness  Throat:   Lips, mucosa, and tongue normal; teeth and gums normal  Neck:   Supple, symmetrical, trachea midline, no adenopathy;    thyroid:  no enlargement/tenderness/nodules  Back:     Symmetric, no curvature, ROM normal, no CVA tenderness  Lungs:     respirations unlabored  Chest Wall:    No tenderness or deformity   Heart:    Regular rate and rhythm     Abdomen:     Soft, non-tender, bowel sounds active all four quadrants,    no masses, no organomegaly; at umbilicus  Genitalia:    Normal female without lesion, discharge or tenderness; gravid uterus     Extremities:   Extremities normal, atraumatic, no cyanosis or edema  Pulses:   2+ and symmetric all extremities  Skin:   Skin color, texture, turgor normal, no rashes or lesions     Assessment:    Pregnancy: [id number] Patient Active Problem List   Diagnosis Date Noted  . Encounter for supervision of normal pregnancy, unspecified, unspecified trimester 11/11/2019       Plan:     Initial labs drawn. Prenatal vitamins. Problem list reviewed and  updated. AFP3 discussed: requested. Role of ultrasound in pregnancy discussed; fetal survey: requested. Amniocentesis discussed: not indicated. Follow up in 4 weeks. 60% of 40 min visit spent on counseling and coordination of care.  Flu shot today  Check cervical length with anatomy scan    Lavonia Drafts 11/11/2019

## 2019-11-12 ENCOUNTER — Other Ambulatory Visit: Payer: Self-pay | Admitting: Obstetrics & Gynecology

## 2019-11-12 DIAGNOSIS — N760 Acute vaginitis: Secondary | ICD-10-CM

## 2019-11-12 DIAGNOSIS — B9689 Other specified bacterial agents as the cause of diseases classified elsewhere: Secondary | ICD-10-CM

## 2019-11-12 DIAGNOSIS — B373 Candidiasis of vulva and vagina: Secondary | ICD-10-CM

## 2019-11-12 DIAGNOSIS — B3731 Acute candidiasis of vulva and vagina: Secondary | ICD-10-CM

## 2019-11-12 LAB — CERVICOVAGINAL ANCILLARY ONLY
Bacterial Vaginitis (gardnerella): POSITIVE — AB
Candida Glabrata: NEGATIVE
Candida Vaginitis: POSITIVE — AB
Chlamydia: NEGATIVE
Comment: NEGATIVE
Comment: NEGATIVE
Comment: NEGATIVE
Comment: NEGATIVE
Comment: NEGATIVE
Comment: NORMAL
Neisseria Gonorrhea: NEGATIVE
Trichomonas: NEGATIVE

## 2019-11-12 MED ORDER — TERCONAZOLE 0.4 % VA CREA: 1.0000 | TOPICAL_CREAM | Freq: Every day | VAGINAL | 0 refills | Status: DC

## 2019-11-12 MED ORDER — METRONIDAZOLE 500 MG PO TABS: 500.0000 mg | ORAL_TABLET | Freq: Two times a day (BID) | ORAL | 0 refills | Status: DC

## 2019-11-13 HISTORY — DX: Maternal care for unspecified type scar from previous cesarean delivery: O34.219

## 2019-11-13 LAB — OBSTETRIC PANEL, INCLUDING HIV
Antibody Screen: NEGATIVE
Basophils Absolute: 0 10*3/uL (ref 0.0–0.2)
Basos: 0 %
EOS (ABSOLUTE): 0.1 10*3/uL (ref 0.0–0.4)
Eos: 1 %
HIV Screen 4th Generation wRfx: NONREACTIVE
Hematocrit: 30.7 % — ABNORMAL LOW (ref 34.0–46.6)
Hemoglobin: 10.5 g/dL — ABNORMAL LOW (ref 11.1–15.9)
Hepatitis B Surface Ag: NEGATIVE
Immature Grans (Abs): 0 10*3/uL (ref 0.0–0.1)
Immature Granulocytes: 0 %
Lymphocytes Absolute: 1.4 10*3/uL (ref 0.7–3.1)
Lymphs: 14 %
MCH: 28.8 pg (ref 26.6–33.0)
MCHC: 34.2 g/dL (ref 31.5–35.7)
MCV: 84 fL (ref 79–97)
Monocytes Absolute: 0.5 10*3/uL (ref 0.1–0.9)
Monocytes: 5 %
Neutrophils Absolute: 8.1 10*3/uL — ABNORMAL HIGH (ref 1.4–7.0)
Neutrophils: 80 %
Platelets: 310 10*3/uL (ref 150–450)
RBC: 3.65 x10E6/uL — ABNORMAL LOW (ref 3.77–5.28)
RDW: 16.6 % — ABNORMAL HIGH (ref 11.7–15.4)
RPR Ser Ql: NONREACTIVE
Rh Factor: POSITIVE
Rubella Antibodies, IGG: 3.23 index (ref >0.99)
WBC: 10 10*3/uL (ref 3.4–10.8)

## 2019-11-13 LAB — AFP, SERUM, OPEN SPINA BIFIDA
AFP MoM: 0.7
AFP Value: 41 ng/mL
Gest. Age on Collection Date: 19.3 weeks
Maternal Age At EDD: 26.1 yr
OSBR Risk 1 IN: 10000
Test Results:: NEGATIVE
Weight: 143 [lb_av]

## 2019-11-13 LAB — URINE CULTURE, OB REFLEX

## 2019-11-13 LAB — CULTURE, OB URINE

## 2019-11-13 NOTE — L&D Delivery Note (Signed)
[facility] Delivery Note  Phyllis Alexander is a 24 y.o. G4P1011 s/p VBAC at [redacted]w[redacted]d. She was admitted for IOL for FGR.   ROM: 0h 21m with clear fluid GBS Status: Negative/-- (04/28 0951) Maximum Maternal Temperature: 97.37F  Labor Progress: . Initial SVE: 1.5/50/-3. Patient received Foley balloon, Pitocin and AROM. Received epidural. She then progressed to complete.   Delivery Date/Time: 5/3 @ 1147 Delivery: Called to room and patient was complete and pushing. Head delivered in ROA position. No nuchal cord present. Shoulder and body delivered in usual fashion. Infant with spontaneous cry, placed on mother's abdomen, dried and stimulated. Cord clamped x 2 after 1-minute delay, and cut by support person. Cord blood drawn. Placenta delivered spontaneously with gentle cord traction. Fundus firm with massage and Pitocin. Labia, perineum, vagina, and cervix inspected inspected with bilateral labial lacerations which were repaired with 4-0 Monocryl in a standard fashion.  Baby Weight: 2155g  Placenta: Sent to L&D Complications: None Lacerations: bilateral labial EBL: 175 mL Analgesia: Epidural   Infant:  APGAR (1 MIN): 8   APGAR (5 MINS): 9   APGAR (10 MINS):     Phyllis Birkenhead, MD [facility] Fellow, [facility], [facility] 03/14/2020, 12:19 PM

## 2019-11-17 ENCOUNTER — Encounter: Payer: Self-pay | Admitting: Obstetrics and Gynecology

## 2019-11-18 ENCOUNTER — Encounter: Payer: Self-pay | Admitting: Obstetrics and Gynecology

## 2019-11-23 DIAGNOSIS — O99019 Anemia complicating pregnancy, unspecified trimester: Secondary | ICD-10-CM | POA: Insufficient documentation

## 2019-11-23 NOTE — Assessment & Plan Note (Signed)
11/23/2019 note to begin pt on FeSO4.

## 2019-11-27 ENCOUNTER — Other Ambulatory Visit: Payer: Self-pay

## 2019-11-27 ENCOUNTER — Other Ambulatory Visit: Payer: Self-pay | Admitting: Obstetrics & Gynecology

## 2019-11-27 ENCOUNTER — Other Ambulatory Visit (HOSPITAL_COMMUNITY): Payer: Self-pay | Admitting: Obstetrics and Gynecology

## 2019-11-27 ENCOUNTER — Ambulatory Visit (HOSPITAL_COMMUNITY)
Admission: RE | Admit: 2019-11-27 | Discharge: 2019-11-27 | Disposition: A | Discharge status: Home / Self Care | Payer: Medicaid Other | Source: Ambulatory Visit | Attending: Obstetrics and Gynecology | Admitting: Obstetrics and Gynecology

## 2019-11-27 DIAGNOSIS — Z3490 Encounter for supervision of normal pregnancy, unspecified, unspecified trimester: Secondary | ICD-10-CM | POA: Diagnosis present

## 2019-11-27 DIAGNOSIS — O34219 Maternal care for unspecified type scar from previous cesarean delivery: Secondary | ICD-10-CM | POA: Insufficient documentation

## 2019-11-27 DIAGNOSIS — O09212 Supervision of pregnancy with history of pre-term labor, second trimester: Secondary | ICD-10-CM

## 2019-11-27 DIAGNOSIS — Z8759 Personal history of other complications of pregnancy, childbirth and the puerperium: Secondary | ICD-10-CM | POA: Diagnosis present

## 2019-11-27 DIAGNOSIS — Z3A21 21 weeks gestation of pregnancy: Secondary | ICD-10-CM | POA: Diagnosis not present

## 2019-11-30 ENCOUNTER — Other Ambulatory Visit (HOSPITAL_COMMUNITY): Payer: Self-pay | Admitting: *Deleted

## 2019-11-30 DIAGNOSIS — Z8751 Personal history of pre-term labor: Secondary | ICD-10-CM

## 2019-12-09 ENCOUNTER — Encounter: Payer: Self-pay | Admitting: Certified Nurse Midwife

## 2019-12-09 ENCOUNTER — Other Ambulatory Visit: Payer: Self-pay

## 2019-12-09 ENCOUNTER — Telehealth: Payer: Medicaid Other | Admitting: Certified Nurse Midwife

## 2019-12-09 ENCOUNTER — Telehealth (INDEPENDENT_AMBULATORY_CARE_PROVIDER_SITE_OTHER): Payer: Medicaid Other | Admitting: Certified Nurse Midwife

## 2019-12-09 DIAGNOSIS — O99012 Anemia complicating pregnancy, second trimester: Secondary | ICD-10-CM

## 2019-12-09 DIAGNOSIS — O34219 Maternal care for unspecified type scar from previous cesarean delivery: Secondary | ICD-10-CM

## 2019-12-09 DIAGNOSIS — Z3A23 23 weeks gestation of pregnancy: Secondary | ICD-10-CM

## 2019-12-09 DIAGNOSIS — Z3490 Encounter for supervision of normal pregnancy, unspecified, unspecified trimester: Secondary | ICD-10-CM

## 2019-12-09 NOTE — Progress Notes (Signed)
TELEHEALTH OBSTETRICS PRENATAL VIRTUAL VIDEO VISIT ENCOUNTER NOTE  Provider location: Center for [facility] [place]   I connected with Phyllis Alexander on 12/09/19 at  8:39 AM EST by MyChart Video Encounter at home and verified that I am speaking with the correct person using two identifiers.   I discussed the limitations, risks, security and privacy concerns of performing an evaluation and management service virtually and the availability of in person appointments. I also discussed with the patient that there may be a patient responsible charge related to this service. The patient expressed understanding and agreed to proceed. Subjective:  Phyllis Alexander is a 24 y.o. G4P1011 at [redacted]w[redacted]d being seen today for ongoing prenatal care.  She is currently monitored for the following issues for this high-risk pregnancy and has Encounter for supervision of normal pregnancy, unspecified, unspecified trimester; Previous cesarean delivery affecting pregnancy, antepartum; History of pregnancy complication; and Anemia, antepartum on their problem list.  Patient reports common cold.  Contractions: Not present. Vag. Bleeding: None.  Movement: Present. Denies any leaking of fluid.   The following portions of the patient's history were reviewed and updated as appropriate: allergies, current medications, past family history, past medical history, past social history, past surgical history and problem list.   Objective:  There were no vitals filed for this visit.  Fetal Status:     Movement: Present     General:  Alert, oriented and cooperative. Patient is in no acute distress.  Respiratory: Normal respiratory effort, no problems with respiration noted  Mental Status: Normal mood and affect. Normal behavior. Normal judgment and thought content.  Rest of physical exam deferred due to type of encounter  Imaging: [place] MFM OB TRANSVAGINAL  Result Date:  11/27/2019 ----------------------------------------------------------------------  OBSTETRICS REPORT                       (Signed Final 11/27/2019 04:33 pm) ---------------------------------------------------------------------- Patient Info  ID #:       [id number]                          D.O.B.:  1995/12/30 (24 yrs)  Name:       Phyllis Alexander                  Visit Date: 11/27/2019 02:26 pm ---------------------------------------------------------------------- Performed By  Performed By:     Sandi Mealy        Ref. Address:     [address] [place],                                                             [place] [address]  Attending:        Noralee Space MD        [facility]  Referred By:      Willodean Rosenthal MD ---------------------------------------------------------------------- Orders   #  Description                          Code         Ordered By   1  [place] MFM OB DETAIL +14 WK              76811.01     CAROLYN                                                        HARRAWAY-SMITH   2  [place] MFM OB TRANSVAGINAL               16109.6      RAVI [facility]  ----------------------------------------------------------------------   #  Order #                    Accession #                 Episode #   1  [id number]                  [id number]                  [id number]   2  [id number]                  [id number]                  [id number]  ---------------------------------------------------------------------- Indications   Encounter for antenatal screening for          Z36.3   malformations (low risk nips)   History of cesarean delivery, currently        O34.219   pregnant.   Poor obstetric history (PPROM)                 O09.219   Poor obstetric history: Previous preterm       O09.219    delivery, antepartum   [redacted] weeks gestation of pregnancy                Z3A.21  ---------------------------------------------------------------------- Fetal Evaluation  Num Of Fetuses:         1  Fetal Heart Rate(bpm):  150  Cardiac Activity:       Observed  Presentation:           Transverse, head to maternal right  Placenta:               Posterior  P. Cord Insertion:      Visualized  Amniotic Fluid  AFI FV:      Within  normal limits                              Largest Pocket(cm)                              5.75 ---------------------------------------------------------------------- Biometry  BPD:      49.3  mm     G. Age:  20w 6d         23  %    CI:        73.72   %    70 - 86                                                          FL/HC:      19.8   %    18.4 - 20.2  HC:      182.4  mm     G. Age:  20w 4d          8  %    HC/AC:      1.16        1.06 - 1.25  AC:      157.9  mm     G. Age:  20w 6d         23  %    FL/BPD:     73.4   %    71 - 87  FL:       36.2  mm     G. Age:  21w 3d         37  %    FL/AC:      22.9   %    20 - 24  HUM:      35.1  mm     G. Age:  22w 0d         61  %  CER:      23.5  mm     G. Age:  21w 5d         55  %  LV:        4.9  mm  CM:        3.9  mm  Est. FW:     400  gm    0 lb 14 oz      22  % ---------------------------------------------------------------------- OB History  Gravidity:    4         Term:   1        Prem:   1        SAB:   0  TOP:          1        Living:  3 ---------------------------------------------------------------------- Gestational Age  LMP:           21w 4d        Date:  06/29/19                 EDD:   04/04/20  U/S Today:     21w 0d  EDD:   04/08/20  Best:          [place]21w 4d     Det. By:  LMP  (06/29/19)          EDD:   04/04/20 ---------------------------------------------------------------------- Anatomy  Cranium:               Appears normal         Aortic Arch:            Appears normal  Cavum:                  Appears normal         Ductal Arch:            Appears normal  Ventricles:            Appears normal         Diaphragm:              Appears normal  Choroid Plexus:        Appears normal         Stomach:                Appears normal, left                                                                        sided  Cerebellum:            Appears normal         Abdomen:                Appears normal  Posterior Fossa:       Appears normal         Abdominal Wall:         Appears nml (cord                                                                        insert, abd wall)  Nuchal Fold:           Not applicable (>20    Cord Vessels:           Appears normal ([redacted]                         wks GA)                                        vessel cord)  Face:                  Appears normal         Kidneys:                Not well visualized                         (orbits and profile)  Lips:                  Appears normal         Bladder:                Appears normal  Thoracic:              Appears normal         Spine:                  Not well visualized  Heart:                 Appears normal         Upper Extremities:      Appears normal                         (4CH, axis, and                         situs)  RVOT:                  Appears normal         Lower Extremities:      Appears normal  LVOT:                  Appears normal  Other:  Nasal bone visualized. Female gender Technically difficult due to fetal          position. ---------------------------------------------------------------------- Cervix Uterus Adnexa  Cervix  Length:            3.5  cm. ---------------------------------------------------------------------- Impression  Ms. Dan Humphreys, [place] [id number] at 20-weeks' gestation, is here for  fetal anatomy scan.  Obstetric history is significant for a term cesarean delivery.  Subsequent pregnancy (2019), she had dichorionic-  diamniotic twin pregnancy.  At [redacted] weeks gestation, she had  spontaneous rupture of membranes  followed by vaginal  bleeding.  She went into spontaneous labor and delivered a  nonviable female fetuses.  Patient reports no chronic medical conditions.  On cell free fetal DNA screening, the risks of fetal  aneuploidies are not increased.  MSAFP screening showed  low risk for open neural tube defects.  Because of a history of midtrimester pregnancy loss, we  performed a transvaginal ultrasound to evaluate the cervical  length.  The cervix measures 3.5 cm, which is within normal  limits.  No shortening or funneling was seen on transfundal  pressure.  We reassured the patient of the findings. ---------------------------------------------------------------------- Recommendations  -An appointment was made for her to return in 4 weeks for  completion of fetal anatomy. ----------------------------------------------------------------------                  Noralee Space, MD Electronically Signed Final Report   11/27/2019 04:33 pm ----------------------------------------------------------------------  [place] MFM OB DETAIL +14 WK  Result Date: 11/27/2019 ----------------------------------------------------------------------  OBSTETRICS REPORT                       (Signed Final 11/27/2019 04:33 pm) ---------------------------------------------------------------------- Patient Info  ID #:       [id number]                          D.O.B.:  09-20-1996 (23 yrs)  Name:       Phyllis Alexander  Visit Date: 11/27/2019 02:26 pm ---------------------------------------------------------------------- Performed By  Performed By:     Sandi Mealy        Ref. Address:     [address] [place],                                                             [place] [address]  Attending:        Noralee Space MD        [facility]  Referred By:       Willodean Rosenthal MD ---------------------------------------------------------------------- Orders   #  Description                          Code         Ordered By   1  [place] MFM OB DETAIL +14 WK              76811.01     CAROLYN                                                        HARRAWAY-SMITH   2  [place] MFM OB TRANSVAGINAL               26712.4      RAVI [facility]  ----------------------------------------------------------------------   #  Order #                    Accession #                 Episode #   1  [id number]                  [id number]                  [id number]   2  [id number]                  [id number]  [id number]  ---------------------------------------------------------------------- Indications   Encounter for antenatal screening for          Z36.3   malformations (low risk nips)   History of cesarean delivery, currently        O34.219   pregnant.   Poor obstetric history (PPROM)                 O09.219   Poor obstetric history: Previous preterm       O09.219   delivery, antepartum   [redacted] weeks gestation of pregnancy                Z3A.21  ---------------------------------------------------------------------- Fetal Evaluation  Num Of Fetuses:         1  Fetal Heart Rate(bpm):  150  Cardiac Activity:       Observed  Presentation:           Transverse, head to maternal right  Placenta:               Posterior  P. Cord Insertion:      Visualized  Amniotic Fluid  AFI FV:      Within normal limits                              Largest Pocket(cm)                              5.75 ---------------------------------------------------------------------- Biometry  BPD:      49.3  mm     G. Age:  20w 6d         23  %    CI:        73.72   %    70 - 86                                                          FL/HC:      19.8   %    18.4 - 20.2  HC:      182.4  mm     G. Age:  20w 4d          8  %    HC/AC:      1.16        1.06 - 1.25  AC:      157.9  mm     G.  Age:  20w 6d         23  %    FL/BPD:     73.4   %    71 - 87  FL:       36.2  mm     G. Age:  21w 3d         37  %    FL/AC:      22.9   %    20 - 24  HUM:      35.1  mm     G. Age:  22w 0d         61  %  CER:      23.5  mm     G. Age:  21w 5d         55  %  LV:  4.9  mm  CM:        3.9  mm  Est. FW:     400  gm    0 lb 14 oz      22  % ---------------------------------------------------------------------- OB History  Gravidity:    4         Term:   1        Prem:   1        SAB:   0  TOP:          1        Living:  3 ---------------------------------------------------------------------- Gestational Age  LMP:           21w 4d        Date:  06/29/19                 EDD:   04/04/20  U/S Today:     21w 0d                                        EDD:   04/08/20  Best:          21w 4d     Det. By:  LMP  (06/29/19)          EDD:   04/04/20 ---------------------------------------------------------------------- Anatomy  Cranium:               Appears normal         Aortic Arch:            Appears normal  Cavum:                 Appears normal         Ductal Arch:            Appears normal  Ventricles:            Appears normal         Diaphragm:              Appears normal  Choroid Plexus:        Appears normal         Stomach:                Appears normal, left                                                                        sided  Cerebellum:            Appears normal         Abdomen:                Appears normal  Posterior Fossa:       Appears normal         Abdominal Wall:         Appears nml (cord  insert, abd wall)  Nuchal Fold:           Not applicable (>20    Cord Vessels:           Appears normal ([redacted]                         wks GA)                                        vessel cord)  Face:                  Appears normal         Kidneys:                Not well visualized                         (orbits and profile)  Lips:                   Appears normal         Bladder:                Appears normal  Thoracic:              Appears normal         Spine:                  Not well visualized  Heart:                 Appears normal         Upper Extremities:      Appears normal                         (4CH, axis, and                         situs)  RVOT:                  Appears normal         Lower Extremities:      Appears normal  LVOT:                  Appears normal  Other:  Nasal bone visualized. Female gender Technically difficult due to fetal          position. ---------------------------------------------------------------------- Cervix Uterus Adnexa  Cervix  Length:            3.5  cm. ---------------------------------------------------------------------- Impression  Ms. Dan Humphreys, [place] [id number] at 20-weeks' gestation, is here for  fetal anatomy scan.  Obstetric history is significant for a term cesarean delivery.  Subsequent pregnancy (2019), she had dichorionic-  diamniotic twin pregnancy.  At [redacted] weeks gestation, she had  spontaneous rupture of membranes followed by vaginal  bleeding.  She went into spontaneous labor and delivered a  nonviable female fetuses.  Patient reports no chronic medical conditions.  On cell free fetal DNA screening, the risks of fetal  aneuploidies are not increased.  MSAFP screening showed  low risk for open neural tube defects.  Because of a history of midtrimester pregnancy loss, we  performed a transvaginal ultrasound to evaluate the cervical  length.  The cervix measures 3.5 cm, which is within normal  limits.  No shortening or funneling was seen on transfundal  pressure.  We reassured the patient of the findings. ---------------------------------------------------------------------- Recommendations  -An appointment was made for her to return in 4 weeks for  completion of fetal anatomy. ----------------------------------------------------------------------                  Noralee Spaceavi Shankar, MD Electronically Signed Final  Report   11/27/2019 04:33 pm ----------------------------------------------------------------------   Assessment and Plan:  Pregnancy: G4P1011 at 2060w2d 1. Encounter for supervision of normal pregnancy, antepartum, unspecified gravidity - Patient doing well - Anticipatory guidance on upcoming appointments  - Patient has not received BP cuff, encouraged to call [facility] for pickup  - Patient reports usually getting a cold when the seasons change, denies being around anyone that she knows that has been sick  - Discussed safe medications during pregnancy - MyChart message sent to patient with list of safe medications in pregnancy  - COVID19 and pregnancy, reviewed safe practices   2. Previous cesarean delivery affecting pregnancy, antepartum - Plans TOLAC  - Needs appointment with MD to sign consent for TOLAC  3. History of pregnancy complication - CL 3.5cm on 1/15 - WNL  - Patient denies vaginal bleeding, abdominal pain/cramping  - f/u US scheduled in February   4. Anemia, antepartum - Patient reports going to pick up OTC iron supplementation but has not started taking medication at this time  - Encouraged patient to start taking medication, can take once a day every other day for better absorption, patient verbalizes understanding    Preterm labor symptoms and general obstetric precautions including but not limited to vaginal bleeding, contractions, leaking of fluid and fetal movement were reviewed in detail with the patient. I discussed the assessment and treatment plan with the patient. The patient was provided an opportunity to ask questions and all were answered. The patient agreed with the plan and demonstrated an understanding of the instructions. The patient was advised to call back or seek an in-person office evaluation/[facility] for any urgent or concerning symptoms. Please refer to After Visit Summary for other counseling recommendations.    I provided 10 minutes of face-to-face time during this encounter.  Return in about 5 weeks (around 01/13/2020) for ROB/GTT.  Future Appointments  Date Time Provider Department Center  12/25/2019  2:10 PM [facility] NURSE [facility]  12/25/2019  2:15 PM [facility] US 4 [facility]    Sharyon CableVeronica C Marceil Welp, CNM [facility], [facility]

## 2019-12-09 NOTE — Patient Instructions (Signed)

## 2019-12-09 NOTE — Progress Notes (Signed)
Pt is on the phone preparing for virtual visit with provider. [redacted]w[redacted]d. Pt has not picked up her BP cuff yet, advised pt to call [facility] and see if it is ready and let us know if she has any issues getting it- pt voices understanding. Pt denies HA or blurry vision.

## 2019-12-25 ENCOUNTER — Ambulatory Visit (HOSPITAL_COMMUNITY)
Admission: RE | Admit: 2019-12-25 | Discharge: 2019-12-25 | Disposition: A | Discharge status: Home / Self Care | Payer: Medicaid Other | Source: Ambulatory Visit | Attending: Obstetrics and Gynecology | Admitting: Obstetrics and Gynecology

## 2019-12-25 ENCOUNTER — Other Ambulatory Visit (HOSPITAL_COMMUNITY): Payer: Self-pay | Admitting: *Deleted

## 2019-12-25 ENCOUNTER — Other Ambulatory Visit: Payer: Self-pay

## 2019-12-25 ENCOUNTER — Ambulatory Visit (HOSPITAL_COMMUNITY): Payer: Medicaid Other | Admitting: *Deleted

## 2019-12-25 ENCOUNTER — Encounter (HOSPITAL_COMMUNITY): Payer: Self-pay

## 2019-12-25 DIAGNOSIS — Z8751 Personal history of pre-term labor: Secondary | ICD-10-CM | POA: Diagnosis present

## 2019-12-25 DIAGNOSIS — Z362 Encounter for other antenatal screening follow-up: Secondary | ICD-10-CM | POA: Diagnosis not present

## 2019-12-25 DIAGNOSIS — Z3490 Encounter for supervision of normal pregnancy, unspecified, unspecified trimester: Secondary | ICD-10-CM

## 2019-12-25 DIAGNOSIS — O99019 Anemia complicating pregnancy, unspecified trimester: Secondary | ICD-10-CM | POA: Diagnosis present

## 2019-12-25 DIAGNOSIS — O34219 Maternal care for unspecified type scar from previous cesarean delivery: Secondary | ICD-10-CM | POA: Insufficient documentation

## 2019-12-25 DIAGNOSIS — Z3A25 25 weeks gestation of pregnancy: Secondary | ICD-10-CM | POA: Diagnosis not present

## 2019-12-25 DIAGNOSIS — Z8759 Personal history of other complications of pregnancy, childbirth and the puerperium: Secondary | ICD-10-CM | POA: Diagnosis present

## 2019-12-25 DIAGNOSIS — O09212 Supervision of pregnancy with history of pre-term labor, second trimester: Secondary | ICD-10-CM

## 2020-01-13 ENCOUNTER — Ambulatory Visit (INDEPENDENT_AMBULATORY_CARE_PROVIDER_SITE_OTHER): Payer: Medicaid Other | Admitting: Nurse Practitioner

## 2020-01-13 ENCOUNTER — Other Ambulatory Visit: Payer: Self-pay

## 2020-01-13 ENCOUNTER — Other Ambulatory Visit: Payer: Medicaid Other

## 2020-01-13 DIAGNOSIS — Z23 Encounter for immunization: Secondary | ICD-10-CM | POA: Diagnosis not present

## 2020-01-13 DIAGNOSIS — Z3493 Encounter for supervision of normal pregnancy, unspecified, third trimester: Secondary | ICD-10-CM

## 2020-01-13 DIAGNOSIS — Z3490 Encounter for supervision of normal pregnancy, unspecified, unspecified trimester: Secondary | ICD-10-CM

## 2020-01-13 DIAGNOSIS — Z3A28 28 weeks gestation of pregnancy: Secondary | ICD-10-CM

## 2020-01-13 MED ORDER — BLOOD PRESSURE KIT: 1.0000 | PACK | Freq: Once | 0 refills | Status: AC

## 2020-01-13 NOTE — Patient Instructions (Signed)

## 2020-01-13 NOTE — Progress Notes (Signed)
    Subjective:  Phyllis Alexander is a 24 y.o. G4P1011 at [redacted]w[redacted]d being seen today for ongoing prenatal care.  She is currently monitored for the following issues for this low-risk pregnancy and has Encounter for supervision of normal pregnancy, unspecified, unspecified trimester; Previous cesarean delivery affecting pregnancy, antepartum; History of pregnancy complication; and Anemia, antepartum on their problem list.  Patient reports no complaints.  Contractions: Not present. Vag. Bleeding: None.  Movement: Present. Denies leaking of fluid.   The following portions of the patient's history were reviewed and updated as appropriate: allergies, current medications, past family history, past medical history, past social history, past surgical history and problem list. Problem list updated.  Objective:   Vitals:   01/13/20 0827  BP: 116/73  Pulse: 80  Weight: 135 lb (61.2 kg)    Fetal Status: Fetal Heart Rate (bpm): 140 Fundal Height: 28 cm Movement: Present     General:  Alert, oriented and cooperative. Patient is in no acute distress.  Skin: Skin is warm and dry. No rash noted.   Cardiovascular: Normal heart rate noted  Respiratory: Normal respiratory effort, no problems with respiration noted  Abdomen: Soft, gravid, appropriate for gestational age. Pain/Pressure: Present     Pelvic:  Cervical exam deferred        Extremities: Normal range of motion.  Edema: None  Mental Status: Normal mood and affect. Normal behavior. Normal judgment and thought content.   Urinalysis:      Assessment and Plan:  Pregnancy: G4P1011 at [redacted]w[redacted]d  1. Encounter for supervision of normal pregnancy, antepartum, unspecified gravidity Drank juice this morning before her appointment not realizing that fasting included juice also.  Will reschedule GTT Got TDAP Cannot open Babyscripts Advised to check BP weekly and add to Babyscripts.  Notify the office if you cannot get it to open. Advised online childbirth and  breastfeeding classes especially if planning a VBAC Does not want more children right now so advised considering LARC contraception but client left before getting written info today.  - Babyscripts Schedule Optimization  Preterm labor symptoms and general obstetric precautions including but not limited to vaginal bleeding, contractions, leaking of fluid and fetal movement were reviewed in detail with the patient. Please refer to After Visit Summary for other counseling recommendations.  Return for 2 hr GTT lab only and 2 weeks in person to see MD for delivery plan.  Nolene Bernheim, RN, MSN, NP-BC Nurse Practitioner, [facility] for [employer], [facility] 01/13/20  10:00 AM

## 2020-01-19 ENCOUNTER — Other Ambulatory Visit: Payer: Medicaid Other

## 2020-01-19 ENCOUNTER — Other Ambulatory Visit: Payer: Self-pay

## 2020-01-19 DIAGNOSIS — Z3490 Encounter for supervision of normal pregnancy, unspecified, unspecified trimester: Secondary | ICD-10-CM

## 2020-01-20 LAB — CBC
Hematocrit: 29.8 % — ABNORMAL LOW (ref 34.0–46.6)
Hemoglobin: 10 g/dL — ABNORMAL LOW (ref 11.1–15.9)
MCH: 27.8 pg (ref 26.6–33.0)
MCHC: 33.6 g/dL (ref 31.5–35.7)
MCV: 83 fL (ref 79–97)
Platelets: 295 10*3/uL (ref 150–450)
RBC: 3.6 x10E6/uL — ABNORMAL LOW (ref 3.77–5.28)
RDW: 13.6 % (ref 11.7–15.4)
WBC: 11.2 10*3/uL — ABNORMAL HIGH (ref 3.4–10.8)

## 2020-01-20 LAB — GLUCOSE TOLERANCE, 2 HOURS W/ 1HR
Glucose, 1 hour: 103 mg/dL (ref 65–179)
Glucose, 2 hour: 75 mg/dL (ref 65–152)
Glucose, Fasting: 76 mg/dL (ref 65–91)

## 2020-01-20 LAB — HIV ANTIBODY (ROUTINE TESTING W REFLEX): HIV Screen 4th Generation wRfx: NONREACTIVE

## 2020-01-20 LAB — RPR: RPR Ser Ql: NONREACTIVE

## 2020-01-22 ENCOUNTER — Encounter (HOSPITAL_COMMUNITY): Payer: Self-pay

## 2020-01-22 ENCOUNTER — Ambulatory Visit (HOSPITAL_COMMUNITY): Payer: Medicaid Other | Admitting: *Deleted

## 2020-01-22 ENCOUNTER — Ambulatory Visit (HOSPITAL_COMMUNITY)
Admission: RE | Admit: 2020-01-22 | Discharge: 2020-01-22 | Disposition: A | Discharge status: Home / Self Care | Payer: Medicaid Other | Source: Ambulatory Visit | Attending: Obstetrics and Gynecology | Admitting: Obstetrics and Gynecology

## 2020-01-22 ENCOUNTER — Other Ambulatory Visit (HOSPITAL_COMMUNITY): Payer: Self-pay | Admitting: *Deleted

## 2020-01-22 ENCOUNTER — Other Ambulatory Visit: Payer: Self-pay

## 2020-01-22 DIAGNOSIS — Z3490 Encounter for supervision of normal pregnancy, unspecified, unspecified trimester: Secondary | ICD-10-CM | POA: Insufficient documentation

## 2020-01-22 DIAGNOSIS — O34219 Maternal care for unspecified type scar from previous cesarean delivery: Secondary | ICD-10-CM

## 2020-01-22 DIAGNOSIS — O09213 Supervision of pregnancy with history of pre-term labor, third trimester: Secondary | ICD-10-CM | POA: Diagnosis not present

## 2020-01-22 DIAGNOSIS — Z3A29 29 weeks gestation of pregnancy: Secondary | ICD-10-CM

## 2020-01-22 DIAGNOSIS — O99019 Anemia complicating pregnancy, unspecified trimester: Secondary | ICD-10-CM

## 2020-01-22 DIAGNOSIS — Z362 Encounter for other antenatal screening follow-up: Secondary | ICD-10-CM | POA: Insufficient documentation

## 2020-01-22 DIAGNOSIS — Z0374 Encounter for suspected problem with fetal growth ruled out: Secondary | ICD-10-CM

## 2020-01-22 DIAGNOSIS — Z8759 Personal history of other complications of pregnancy, childbirth and the puerperium: Secondary | ICD-10-CM | POA: Diagnosis present

## 2020-01-27 ENCOUNTER — Ambulatory Visit (INDEPENDENT_AMBULATORY_CARE_PROVIDER_SITE_OTHER): Payer: Medicaid Other | Admitting: Obstetrics and Gynecology

## 2020-01-27 ENCOUNTER — Other Ambulatory Visit: Payer: Self-pay

## 2020-01-27 ENCOUNTER — Encounter: Payer: Self-pay | Admitting: Obstetrics and Gynecology

## 2020-01-27 VITALS — BP 105/66 | HR 89 | Wt 155.0 lb

## 2020-01-27 DIAGNOSIS — Z3A30 30 weeks gestation of pregnancy: Secondary | ICD-10-CM

## 2020-01-27 DIAGNOSIS — Z3480 Encounter for supervision of other normal pregnancy, unspecified trimester: Secondary | ICD-10-CM

## 2020-01-27 DIAGNOSIS — O99013 Anemia complicating pregnancy, third trimester: Secondary | ICD-10-CM

## 2020-01-27 DIAGNOSIS — O34219 Maternal care for unspecified type scar from previous cesarean delivery: Secondary | ICD-10-CM

## 2020-01-27 DIAGNOSIS — O99019 Anemia complicating pregnancy, unspecified trimester: Secondary | ICD-10-CM

## 2020-01-27 MED ORDER — FERROUS SULFATE 325 (65 FE) MG PO TABS: 325.0000 mg | ORAL_TABLET | Freq: Two times a day (BID) | ORAL | 1 refills | Status: DC

## 2020-01-27 NOTE — Progress Notes (Signed)
   PRENATAL VISIT NOTE  Subjective:  Phyllis Alexander is a 24 y.o. G4P1011 at [redacted]w[redacted]d being seen today for ongoing prenatal care.  She is currently monitored for the following issues for this low-risk pregnancy and has Encounter for supervision of normal pregnancy, unspecified, unspecified trimester; Previous cesarean delivery affecting pregnancy, antepartum; History of pregnancy complication; and Anemia, antepartum on their problem list.  Patient reports no complaints.  Contractions: Not present. Vag. Bleeding: None.  Movement: Present. Denies leaking of fluid.   The following portions of the patient's history were reviewed and updated as appropriate: allergies, current medications, past family history, past medical history, past social history, past surgical history and problem list.   Objective:   Vitals:   01/27/20 0843  BP: 105/66  Pulse: 89  Weight: 155 lb (70.3 kg)    Fetal Status: Fetal Heart Rate (bpm): 140 Fundal Height: 30 cm Movement: Present     General:  Alert, oriented and cooperative. Patient is in no acute distress.  Skin: Skin is warm and dry. No rash noted.   Cardiovascular: Normal heart rate noted  Respiratory: Normal respiratory effort, no problems with respiration noted  Abdomen: Soft, gravid, appropriate for gestational age.  Pain/Pressure: Present     Pelvic: Cervical exam deferred        Extremities: Normal range of motion.     Mental Status: Normal mood and affect. Normal behavior. Normal judgment and thought content.   Assessment and Plan:  Pregnancy: G4P1011 at [redacted]w[redacted]d 1. Supervision of other normal pregnancy, antepartum Patient is doing well without complaints Follow up ultrasound scheduled  2. Anemia, antepartum Rx iron provided  3. Previous cesarean delivery affecting pregnancy, antepartum Patient remains undecided. Information provided Patient to return in 2 weeks to sign consent  Preterm labor symptoms and general obstetric precautions including  but not limited to vaginal bleeding, contractions, leaking of fluid and fetal movement were reviewed in detail with the patient. Please refer to After Visit Summary for other counseling recommendations.   Return in about 2 weeks (around 02/10/2020) for in person, ROB, Low risk.  Future Appointments  Date Time Provider Department Center  02/10/2020  8:15 AM Marny Lowenstein, PA-C [facility] None  02/18/2020  9:45 AM [facility] NURSE [facility]  02/18/2020  9:45 AM [facility] [place] 2 [facility]    Catalina Antigua, MD

## 2020-02-10 ENCOUNTER — Encounter: Payer: Self-pay | Admitting: Medical

## 2020-02-10 ENCOUNTER — Other Ambulatory Visit: Payer: Self-pay

## 2020-02-10 ENCOUNTER — Ambulatory Visit (INDEPENDENT_AMBULATORY_CARE_PROVIDER_SITE_OTHER): Payer: Medicaid Other | Admitting: Medical

## 2020-02-10 VITALS — BP 119/72 | HR 93 | Wt 153.0 lb

## 2020-02-10 DIAGNOSIS — O99019 Anemia complicating pregnancy, unspecified trimester: Secondary | ICD-10-CM

## 2020-02-10 DIAGNOSIS — O99013 Anemia complicating pregnancy, third trimester: Secondary | ICD-10-CM

## 2020-02-10 DIAGNOSIS — Z3A32 32 weeks gestation of pregnancy: Secondary | ICD-10-CM

## 2020-02-10 DIAGNOSIS — O34219 Maternal care for unspecified type scar from previous cesarean delivery: Secondary | ICD-10-CM

## 2020-02-10 DIAGNOSIS — Z3480 Encounter for supervision of other normal pregnancy, unspecified trimester: Secondary | ICD-10-CM

## 2020-02-10 DIAGNOSIS — O26843 Uterine size-date discrepancy, third trimester: Secondary | ICD-10-CM

## 2020-02-10 DIAGNOSIS — N949 Unspecified condition associated with female genital organs and menstrual cycle: Secondary | ICD-10-CM

## 2020-02-10 MED ORDER — FERROUS SULFATE 325 (65 FE) MG PO TABS: 325.0000 mg | ORAL_TABLET | Freq: Two times a day (BID) | ORAL | 1 refills | Status: DC

## 2020-02-10 MED ORDER — COMFORT FIT MATERNITY SUPP MED MISC: 1.0000 [IU] | Freq: Every day | 0 refills | Status: DC

## 2020-02-10 NOTE — Patient Instructions (Addendum)
Fetal Movement Counts Patient Name: ________________________________________________ Patient Due Date: ____________________ What is a fetal movement count?  A fetal movement count is the number of times that you feel your baby move during a certain amount of time. This may also be called a fetal kick count. A fetal movement count is recommended for every pregnant woman. You may be asked to start counting fetal movements as early as week 28 of your pregnancy. Pay attention to when your baby is most active. You may notice your baby's sleep and wake cycles. You may also notice things that make your baby move more. You should do a fetal movement count:  When your baby is normally most active.  At the same time each day. A good time to count movements is while you are resting, after having something to eat and drink. How do I count fetal movements? 1. Find a quiet, comfortable area. Sit, or lie down on your side. 2. Write down the date, the start time and stop time, and the number of movements that you felt between those two times. Take this information with you to your health care visits. 3. Write down your start time when you feel the first movement. 4. Count kicks, flutters, swishes, rolls, and jabs. You should feel at least 10 movements. 5. You may stop counting after you have felt 10 movements, or if you have been counting for 2 hours. Write down the stop time. 6. If you do not feel 10 movements in 2 hours, contact your health care provider for further instructions. Your health care provider may want to do additional tests to assess your baby's well-being. Contact a health care provider if:  You feel fewer than 10 movements in 2 hours.  Your baby is not moving like he or she usually does. Date: ____________ Start time: ____________ Stop time: ____________ Movements: ____________ Date: ____________ Start time: ____________ Stop time: ____________ Movements: ____________ Date: ____________  Start time: ____________ Stop time: ____________ Movements: ____________ Date: ____________ Start time: ____________ Stop time: ____________ Movements: ____________ Date: ____________ Start time: ____________ Stop time: ____________ Movements: ____________ Date: ____________ Start time: ____________ Stop time: ____________ Movements: ____________ Date: ____________ Start time: ____________ Stop time: ____________ Movements: ____________ Date: ____________ Start time: ____________ Stop time: ____________ Movements: ____________ Date: ____________ Start time: ____________ Stop time: ____________ Movements: ____________ This information is not intended to replace advice given to you by your health care provider. Make sure you discuss any questions you have with your health care provider. Document Revised: 06/18/2019 Document Reviewed: 06/18/2019 Elsevier Patient Education  2020 [employer] Braxton Hicks Contractions Contractions of the uterus can occur throughout pregnancy, but they are not always a sign that you are in labor. You may have practice contractions called Braxton Hicks contractions. These false labor contractions are sometimes confused with true labor. What are Braxton Hicks contractions? Braxton Hicks contractions are tightening movements that occur in the muscles of the uterus before labor. Unlike true labor contractions, these contractions do not result in opening (dilation) and thinning of the cervix. Toward the end of pregnancy (32-34 weeks), Braxton Hicks contractions can happen more often and may become stronger. These contractions are sometimes difficult to tell apart from true labor because they can be very uncomfortable. You should not feel embarrassed if you go to the hospital with false labor. Sometimes, the only way to tell if you are in true labor is for your health care provider to look for changes in the cervix. The health care provider   will do a physical exam and may  monitor your contractions. If you are not in true labor, the exam should show that your cervix is not dilating and your water has not broken. If there are no other health problems associated with your pregnancy, it is completely safe for you to be sent home with false labor. You may continue to have Braxton Hicks contractions until you go into true labor. How to tell the difference between true labor and false labor True labor  Contractions last 30-70 seconds.  Contractions become very regular.  Discomfort is usually felt in the top of the uterus, and it spreads to the lower abdomen and low back.  Contractions do not go away with walking.  Contractions usually become more intense and increase in frequency.  The cervix dilates and gets thinner. False labor  Contractions are usually shorter and not as strong as true labor contractions.  Contractions are usually irregular.  Contractions are often felt in the front of the lower abdomen and in the groin.  Contractions may go away when you walk around or change positions while lying down.  Contractions get weaker and are shorter-lasting as time goes on.  The cervix usually does not dilate or become thin. Follow these instructions at home:   Take over-the-counter and prescription medicines only as told by your health care provider.  Keep up with your usual exercises and follow other instructions from your health care provider.  Eat and drink lightly if you think you are going into labor.  If Braxton Hicks contractions are making you uncomfortable: ? Change your position from lying down or resting to walking, or change from walking to resting. ? Sit and rest in a tub of warm water. ? Drink enough fluid to keep your urine pale yellow. Dehydration may cause these contractions. ? Do slow and deep breathing several times an hour.  Keep all follow-up prenatal visits as told by your health care provider. This is important. Contact a  health care provider if:  You have a fever.  You have continuous pain in your abdomen. Get help right away if:  Your contractions become stronger, more regular, and closer together.  You have fluid leaking or gushing from your vagina.  You pass blood-tinged mucus (bloody show).  You have bleeding from your vagina.  You have low back pain that you never had before.  You feel your baby's head pushing down and causing pelvic pressure.  Your baby is not moving inside you as much as it used to. Summary  Contractions that occur before labor are called Braxton Hicks contractions, false labor, or practice contractions.  Braxton Hicks contractions are usually shorter, weaker, farther apart, and less regular than true labor contractions. True labor contractions usually become progressively stronger and regular, and they become more frequent.  Manage discomfort from [facility] contractions by changing position, resting in a warm bath, drinking plenty of water, or practicing deep breathing. This information is not intended to replace advice given to you by your health care provider. Make sure you discuss any questions you have with your health care provider. Document Revised: 10/11/2017 Document Reviewed: 03/14/2017 Elsevier Patient Education  2020 [employer].   For abdominal support band:  [facility]   [address] [place], [place], [place] [address] Phone: [phone]  Monday     8:30AM-5PM Tuesday 8:30AM-5PM Wednesday 8:30AM-5PM Thursday 8:30AM-5PM Friday  8:30AM-5PM Saturday Closed Sunday Closed

## 2020-02-10 NOTE — Progress Notes (Signed)
ROB   CC:  None per pt

## 2020-02-10 NOTE — Progress Notes (Signed)
   PRENATAL VISIT NOTE  Subjective:  Phyllis Alexander is a 24 y.o. G4P1011 at [redacted]w[redacted]d being seen today for ongoing prenatal care.  She is currently monitored for the following issues for this low-risk pregnancy and has Encounter for supervision of normal pregnancy, unspecified, unspecified trimester; Previous cesarean delivery affecting pregnancy, antepartum; History of pregnancy complication; Anemia, antepartum; and Uterine size date discrepancy pregnancy, third trimester on their problem list.  Patient reports occasional contractions.  Contractions: Irritability. Vag. Bleeding: None.  Movement: Present. Denies leaking of fluid.   The following portions of the patient's history were reviewed and updated as appropriate: allergies, current medications, past family history, past medical history, past social history, past surgical history and problem list.   Objective:   Vitals:   02/10/20 0826  BP: 119/72  Pulse: 93  Weight: 153 lb (69.4 kg)    Fetal Status: Fetal Heart Rate (bpm): 140 Fundal Height: 30 cm Movement: Present     General:  Alert, oriented and cooperative. Patient is in no acute distress.  Skin: Skin is warm and dry. No rash noted.   Cardiovascular: Normal heart rate noted  Respiratory: Normal respiratory effort, no problems with respiration noted  Abdomen: Soft, gravid, appropriate for gestational age.  Pain/Pressure: Present     Pelvic: Cervical exam deferred        Extremities: Normal range of motion.  Edema: None  Mental Status: Normal mood and affect. Normal behavior. Normal judgment and thought content.   Assessment and Plan:  Pregnancy: G4P1011 at [redacted]w[redacted]d 1. Supervision of other normal pregnancy, antepartum - Doing well - Encouraged to determine Peds by 36 weeks    2. Previous cesarean delivery affecting pregnancy, antepartum - Desires TOLAC, left without signing consent today, needs consent signed   3. Anemia, antepartum - ferrous sulfate (FERROUSUL) 325 (65 FE)  MG tablet; Take 1 tablet (325 mg total) by mouth 2 (two) times daily.  Dispense: 60 tablet; Refill: 1  4. Round ligament pain - Elastic Bandages & Supports (COMFORT FIT MATERNITY SUPP MED) MISC; 1 Units by Does not apply route daily.  Dispense: 1 each; Refill: 0  5. Uterine size date discrepancy pregnancy, third trimester - Has [place] with MFM on 4/8 - Last [place] EFW 11%  Preterm labor symptoms and general obstetric precautions including but not limited to vaginal bleeding, contractions, leaking of fluid and fetal movement were reviewed in detail with the patient. Please refer to After Visit Summary for other counseling recommendations.   Return in about 2 weeks (around 02/24/2020) for LOB, Virtual.  Future Appointments  Date Time Provider Department Center  02/18/2020  9:45 AM [facility] NURSE [facility]  02/18/2020  9:45 AM [facility] [place] 2 [facility]  02/24/2020  8:30 AM Brock Bad, MD [facility] None    Vonzella Nipple, PA-C

## 2020-02-16 ENCOUNTER — Encounter (HOSPITAL_COMMUNITY): Payer: Self-pay

## 2020-02-18 ENCOUNTER — Ambulatory Visit (HOSPITAL_COMMUNITY): Payer: Medicaid Other | Admitting: *Deleted

## 2020-02-18 ENCOUNTER — Other Ambulatory Visit: Payer: Self-pay

## 2020-02-18 ENCOUNTER — Ambulatory Visit (HOSPITAL_COMMUNITY)
Admission: RE | Admit: 2020-02-18 | Discharge: 2020-02-18 | Disposition: A | Discharge status: Home / Self Care | Payer: Medicaid Other | Source: Ambulatory Visit | Attending: Obstetrics and Gynecology | Admitting: Obstetrics and Gynecology

## 2020-02-18 ENCOUNTER — Encounter (HOSPITAL_COMMUNITY): Payer: Self-pay

## 2020-02-18 ENCOUNTER — Other Ambulatory Visit (HOSPITAL_COMMUNITY): Payer: Self-pay | Admitting: *Deleted

## 2020-02-18 DIAGNOSIS — O365991 Maternal care for other known or suspected poor fetal growth, unspecified trimester, fetus 1: Secondary | ICD-10-CM | POA: Diagnosis present

## 2020-02-18 DIAGNOSIS — O09213 Supervision of pregnancy with history of pre-term labor, third trimester: Secondary | ICD-10-CM | POA: Diagnosis not present

## 2020-02-18 DIAGNOSIS — O99019 Anemia complicating pregnancy, unspecified trimester: Secondary | ICD-10-CM | POA: Diagnosis present

## 2020-02-18 DIAGNOSIS — O34219 Maternal care for unspecified type scar from previous cesarean delivery: Secondary | ICD-10-CM

## 2020-02-18 DIAGNOSIS — Z8759 Personal history of other complications of pregnancy, childbirth and the puerperium: Secondary | ICD-10-CM

## 2020-02-18 DIAGNOSIS — Z362 Encounter for other antenatal screening follow-up: Secondary | ICD-10-CM | POA: Diagnosis not present

## 2020-02-18 DIAGNOSIS — Z0374 Encounter for suspected problem with fetal growth ruled out: Secondary | ICD-10-CM | POA: Insufficient documentation

## 2020-02-18 DIAGNOSIS — Z3A33 33 weeks gestation of pregnancy: Secondary | ICD-10-CM | POA: Diagnosis not present

## 2020-02-18 DIAGNOSIS — O09299 Supervision of pregnancy with other poor reproductive or obstetric history, unspecified trimester: Secondary | ICD-10-CM

## 2020-02-18 NOTE — Procedures (Signed)
Phyllis Alexander 1995-12-16 [redacted]w[redacted]d  Fetus A Non-Stress Test Interpretation for 02/18/20  Indication: IUGR  Fetal Heart Rate A Mode: External Baseline Rate (A): 135 bpm Variability: Moderate Accelerations: 15 x 15 Decelerations: None Multiple birth?: No  Uterine Activity Mode: Palpation, Toco Contraction Frequency (min): none Resting Tone Palpated: Relaxed Resting Time: Adequate  Interpretation (Fetal Testing) Overall Impression: Reassuring for gestational age Comments: Reviewed tracing with Dr. Judeth Cornfield

## 2020-02-24 ENCOUNTER — Telehealth (INDEPENDENT_AMBULATORY_CARE_PROVIDER_SITE_OTHER): Payer: Medicaid Other | Admitting: Obstetrics

## 2020-02-24 ENCOUNTER — Encounter: Payer: Self-pay | Admitting: Obstetrics

## 2020-02-24 VITALS — BP 114/64 | HR 85

## 2020-02-24 DIAGNOSIS — Z3A34 34 weeks gestation of pregnancy: Secondary | ICD-10-CM

## 2020-02-24 DIAGNOSIS — Z3490 Encounter for supervision of normal pregnancy, unspecified, unspecified trimester: Secondary | ICD-10-CM

## 2020-02-24 DIAGNOSIS — O34219 Maternal care for unspecified type scar from previous cesarean delivery: Secondary | ICD-10-CM

## 2020-02-24 NOTE — Progress Notes (Signed)
OBSTETRICS PRENATAL VIRTUAL VISIT ENCOUNTER NOTE  Provider location: Center for [facility]   I connected with Phyllis Alexander on 02/24/20 at  8:30 AM EDT by MyChart Video Encounter at home and verified that I am speaking with the correct person using two identifiers.   I discussed the limitations, risks, security and privacy concerns of performing an evaluation and management service virtually and the availability of in person appointments. I also discussed with the patient that there may be a patient responsible charge related to this service. The patient expressed understanding and agreed to proceed. Subjective:  Phyllis Alexander is a 24 y.o. G4P1011 at [redacted]w[redacted]d being seen today for ongoing prenatal care.  She is currently monitored for the following issues for this low-risk pregnancy and has Encounter for supervision of normal pregnancy, unspecified, unspecified trimester; Previous cesarean delivery affecting pregnancy, antepartum; History of pregnancy complication; Anemia, antepartum; and Uterine size date discrepancy pregnancy, third trimester on their problem list.  Patient reports no complaints.  Contractions: Irregular. Vag. Bleeding: None.  Movement: Present. Denies any leaking of fluid.   The following portions of the patient's history were reviewed and updated as appropriate: allergies, current medications, past family history, past medical history, past social history, past surgical history and problem list.   Objective:   Vitals:   02/24/20 0838  BP: 114/64  Pulse: 85    Fetal Status:     Movement: Present     General:  Alert, oriented and cooperative. Patient is in no acute distress.  Respiratory: Normal respiratory effort, no problems with respiration noted  Mental Status: Normal mood and affect. Normal behavior. Normal judgment and thought content.  Rest of physical exam deferred due to type of encounter  Imaging: [place] MFM OB FOLLOW UP  Result Date:  02/18/2020 ----------------------------------------------------------------------  OBSTETRICS REPORT                       (Signed Final 02/18/2020 02:04 pm) ---------------------------------------------------------------------- Patient Info  ID #:       [id number]                          D.O.B.:  12-31-1995 (23 yrs)  Name:       Phyllis Alexander                  Visit Date: 02/18/2020 10:03 am ---------------------------------------------------------------------- Performed By  Performed By:     Lenise Arena        Ref. Address:     [address] [place],                                                             [place] [address]  Attending:        Noralee Space MD  [facility]  Referred By:      Willodean Rosenthal MD ---------------------------------------------------------------------- Orders   #  Description                          Code         Ordered By   1  [place] MFM UA CORD DOPPLER               [phone]     YU FANG   2  [place] MFM OB FOLLOW UP                  [id number]     YU FANG  ----------------------------------------------------------------------   #  Order #                    Accession #                 Episode #   1  [id number]                  [id number]                  [id number]   2  [id number]                  [id number]                  [id number]  ---------------------------------------------------------------------- Indications   Poor obstetric history: Previous preterm       O09.219   delivery, antepartum   Encounter for other antenatal screening        Z36.2   follow-up   History of cesarean delivery, currently        O34.219   pregnant.   [redacted] weeks gestation of pregnancy                Z3A.33   Poor obstetric history (PPROM)                 O09.219   ---------------------------------------------------------------------- Fetal Evaluation  Num Of Fetuses:         1  Fetal Heart Rate(bpm):  141  Cardiac Activity:       Observed  Presentation:           Cephalic  Placenta:               Posterior  P. Cord Insertion:      Previously Visualized  Amniotic Fluid  AFI FV:      Within normal limits  AFI Sum(cm)     %Tile       Largest Pocket(cm)  14.74           52          5.9  RUQ(cm)       RLQ(cm)  LUQ(cm)        LLQ(cm)  4.25          2.19          2.4            5.9 ---------------------------------------------------------------------- Biometry  BPD:        83  mm     G. Age:  33w 3d         44  %    CI:        80.84   %    70 - 86                                                          FL/HC:      21.0   %    19.9 - 21.5  HC:      291.5  mm     G. Age:  32w 1d          2  %    HC/AC:      1.05        0.96 - 1.11  AC:      277.5  mm     G. Age:  31w 6d         11  %    FL/BPD:     73.7   %    71 - 87  FL:       61.2  mm     G. Age:  31w 5d          7  %    FL/AC:      22.1   %    20 - 24  Est. FW:    1873  gm      4 lb 2 oz      9  % ---------------------------------------------------------------------- OB History  Gravidity:    4         Term:   1        Prem:   1        SAB:   0  TOP:          1        Living:  3 ---------------------------------------------------------------------- Gestational Age  LMP:           33w 3d        Date:  06/29/19                 EDD:   04/04/20  U/S Today:     32w 2d                                        EDD:   04/12/20  Best:          33w 3d     Det. By:  LMP  (06/29/19)          EDD:   04/04/20 ---------------------------------------------------------------------- Anatomy  Cranium:               Appears normal         Aortic Arch:            Previously seen  Cavum:  Appears normal         Ductal Arch:            Previously seen  Ventricles:            Appears normal         Diaphragm:              Appears  normal  Choroid Plexus:        Previously seen        Stomach:                Appears normal, left                                                                        sided  Cerebellum:            Previously seen        Abdomen:                Appears normal  Posterior Fossa:       Previously seen        Abdominal Wall:         Previously seen  Nuchal Fold:           Not applicable (>20    Cord Vessels:           Previously seen                         wks GA)  Face:                  Orbits and profile     Kidneys:                Appear normal                         previously seen  Lips:                  Previously seen        Bladder:                Appears normal  Thoracic:              Appears normal         Spine:                  Ltd views no                                                                        intracranial signs of  NTD  Heart:                 See comments           Upper Extremities:      Previously seen  RVOT:                  Previously seen        Lower Extremities:      Previously seen  LVOT:                  Previously seen  Other:  Nasal bone visualized. Open hands and heels previously visualized. ---------------------------------------------------------------------- Doppler - Fetal Vessels  Umbilical Artery   S/D     %tile  2.96       73 ---------------------------------------------------------------------- Cervix Uterus Adnexa  Cervix  Not visualized (advanced GA >24wks) ---------------------------------------------------------------------- Impression  Patient returns for fetal growth assessment.  On previous  ultrasound, the estimated fetal weight was at the 11th  percentile.  She does not have gestational diabetes.  Blood  pressure today at her office is 120/67 mmHg.  On ultrasound, the estimated fetal weight is at the 9th  percentile.  Good interval fetal growth is seen.  Amniotic fluid  is normal and good  fetal activity seen. Umbilical artery  Doppler showed normal forward diastolic flow. NST is  reactive. ---------------------------------------------------------------------- Recommendations  -Continue weekly BPP till delivery.  -Reassess 4-chamber to rule out pericardial effusion. ----------------------------------------------------------------------                  Noralee Spaceavi Shankar, MD Electronically Signed Final Report   02/18/2020 02:04 pm ----------------------------------------------------------------------  US MFM UA CORD DOPPLER  Result Date: 02/18/2020 ----------------------------------------------------------------------  OBSTETRICS REPORT                       (Signed Final 02/18/2020 02:04 pm) ---------------------------------------------------------------------- Patient Info  ID #:       [id number]                          D.O.B.:  18-Apr-1996 (23 yrs)  Name:       Phyllis Alexander                  Visit Date: 02/18/2020 10:03 am ---------------------------------------------------------------------- Performed By  Performed By:     Lenise ArenaHannah Bazemore        Ref. Address:     [address]                    [address]                                                             [address] [place],                                                             [place] [id number]  Attending:        Noralee Spaceavi Shankar MD        [facility]  Referred By:      Willodean Rosenthal MD ---------------------------------------------------------------------- Orders   #  Description                          Code         Ordered By   1  [place] MFM UA CORD DOPPLER               [phone]     YU FANG   2  [place] MFM OB FOLLOW UP                  [id number]     YU FANG  ----------------------------------------------------------------------   #  Order #                    Accession #                 Episode #   1  [id number]                   [id number]                  [id number]   2  [id number]                  [id number]                  [id number]  ---------------------------------------------------------------------- Indications   Poor obstetric history: Previous preterm       O09.219   delivery, antepartum   Encounter for other antenatal screening        Z36.2   follow-up   History of cesarean delivery, currently        O34.219   pregnant.   [redacted] weeks gestation of pregnancy                Z3A.33   Poor obstetric history (PPROM)                 O09.219  ---------------------------------------------------------------------- Fetal Evaluation  Num Of Fetuses:         1  Fetal Heart Rate(bpm):  141  Cardiac Activity:       Observed  Presentation:           Cephalic  Placenta:               Posterior  P. Cord Insertion:      Previously Visualized  Amniotic Fluid  AFI FV:      Within normal limits  AFI Sum(cm)     %Tile       Largest Pocket(cm)  14.74           52          5.9  RUQ(cm)       RLQ(cm)       LUQ(cm)        LLQ(cm)  4.25          2.19          2.4            5.9 ---------------------------------------------------------------------- Biometry  BPD:        83  mm     G. Age:  33w 3d  44  %    CI:        80.84   %    70 - 86                                                          FL/HC:      21.0   %    19.9 - 21.5  HC:      291.5  mm     G. Age:  32w 1d          2  %    HC/AC:      1.05        0.96 - 1.11  AC:      277.5  mm     G. Age:  31w 6d         11  %    FL/BPD:     73.7   %    71 - 87  FL:       61.2  mm     G. Age:  31w 5d          7  %    FL/AC:      22.1   %    20 - 24  Est. FW:    1873  gm      4 lb 2 oz      9  % ---------------------------------------------------------------------- OB History  Gravidity:    4         Term:   1        Prem:   1        SAB:   0  TOP:          1        Living:  3 ---------------------------------------------------------------------- Gestational Age  LMP:           33w 3d        Date:   06/29/19                 EDD:   04/04/20  U/S Today:     32w 2d                                        EDD:   04/12/20  Best:          33w 3d     Det. By:  LMP  (06/29/19)          EDD:   04/04/20 ---------------------------------------------------------------------- Anatomy  Cranium:               Appears normal         Aortic Arch:            Previously seen  Cavum:                 Appears normal         Ductal Arch:            Previously seen  Ventricles:            Appears normal         Diaphragm:              Appears normal  Choroid Plexus:        Previously seen        Stomach:                Appears normal, left                                                                        sided  Cerebellum:            Previously seen        Abdomen:                Appears normal  Posterior Fossa:       Previously seen        Abdominal Wall:         Previously seen  Nuchal Fold:           Not applicable (>63    Cord Vessels:           Previously seen                         wks GA)  Face:                  Orbits and profile     Kidneys:                Appear normal                         previously seen  Lips:                  Previously seen        Bladder:                Appears normal  Thoracic:              Appears normal         Spine:                  Ltd views no                                                                        intracranial signs of                                                                        NTD  Heart:                 See comments           Upper Extremities:      Previously seen  RVOT:  Previously seen        Lower Extremities:      Previously seen  LVOT:                  Previously seen  Other:  Nasal bone visualized. Open hands and heels previously visualized. ---------------------------------------------------------------------- Doppler - Fetal Vessels  Umbilical Artery   S/D     %tile  2.96       73  ---------------------------------------------------------------------- Cervix Uterus Adnexa  Cervix  Not visualized (advanced GA >24wks) ---------------------------------------------------------------------- Impression  Patient returns for fetal growth assessment.  On previous  ultrasound, the estimated fetal weight was at the 11th  percentile.  She does not have gestational diabetes.  Blood  pressure today at her office is 120/67 mmHg.  On ultrasound, the estimated fetal weight is at the 9th  percentile.  Good interval fetal growth is seen.  Amniotic fluid  is normal and good fetal activity seen. Umbilical artery  Doppler showed normal forward diastolic flow. NST is  reactive. ---------------------------------------------------------------------- Recommendations  -Continue weekly BPP till delivery.  -Reassess 4-chamber to rule out pericardial effusion. ----------------------------------------------------------------------                  Noralee Space, MD Electronically Signed Final Report   02/18/2020 02:04 pm ----------------------------------------------------------------------   Assessment and Plan:  Pregnancy: G4P1011 at [redacted]w[redacted]d 1. Encounter for supervision of normal pregnancy, antepartum, unspecified gravidity  2. Previous cesarean delivery affecting pregnancy, antepartum   Preterm labor symptoms and general obstetric precautions including but not limited to vaginal bleeding, contractions, leaking of fluid and fetal movement were reviewed in detail with the patient. I discussed the assessment and treatment plan with the patient. The patient was provided an opportunity to ask questions and all were answered. The patient agreed with the plan and demonstrated an understanding of the instructions. The patient was advised to call back or seek an in-person office evaluation/go to MAU at [facility] for any urgent or concerning symptoms. Please refer to After Visit Summary for other  counseling recommendations.   I provided 10 minutes of face-to-face time during this encounter.  No follow-ups on file.  Future Appointments  Date Time Provider Department Center  02/25/2020  3:30 PM WH-MFC NURSE WH-MFC MFC-US  02/25/2020  3:30 PM WH-MFC [place] 1 WH-MFCUS MFC-US  03/03/2020  7:45 AM WH-MFC NURSE WH-MFC MFC-US  03/03/2020  7:45 AM WH-MFC [place] 5 WH-MFCUS MFC-US  03/08/2020  7:30 AM WH-MFC NURSE WH-MFC MFC-US  03/08/2020  7:30 AM WH-MFC [place] 5 WH-MFCUS MFC-US  03/09/2020  9:15 AM Sharyon Cable, CNM [facility] None    Coral Ceo, MD Center for [facility], [facility] 02/24/2020

## 2020-02-24 NOTE — Progress Notes (Signed)
Pt is scheduled for u/s tomorrow.

## 2020-02-25 ENCOUNTER — Ambulatory Visit (HOSPITAL_COMMUNITY): Payer: Medicaid Other | Admitting: *Deleted

## 2020-02-25 ENCOUNTER — Encounter (HOSPITAL_COMMUNITY): Payer: Self-pay

## 2020-02-25 ENCOUNTER — Ambulatory Visit (HOSPITAL_COMMUNITY)
Admission: RE | Admit: 2020-02-25 | Discharge: 2020-02-25 | Disposition: A | Discharge status: Home / Self Care | Payer: Medicaid Other | Source: Ambulatory Visit | Attending: Obstetrics and Gynecology | Admitting: Obstetrics and Gynecology

## 2020-02-25 ENCOUNTER — Other Ambulatory Visit: Payer: Self-pay

## 2020-02-25 ENCOUNTER — Other Ambulatory Visit (HOSPITAL_COMMUNITY): Payer: Medicaid Other

## 2020-02-25 DIAGNOSIS — O99019 Anemia complicating pregnancy, unspecified trimester: Secondary | ICD-10-CM

## 2020-02-25 DIAGNOSIS — Z3490 Encounter for supervision of normal pregnancy, unspecified, unspecified trimester: Secondary | ICD-10-CM | POA: Diagnosis present

## 2020-02-25 DIAGNOSIS — Z8759 Personal history of other complications of pregnancy, childbirth and the puerperium: Secondary | ICD-10-CM

## 2020-02-25 DIAGNOSIS — O09299 Supervision of pregnancy with other poor reproductive or obstetric history, unspecified trimester: Secondary | ICD-10-CM | POA: Diagnosis not present

## 2020-02-25 DIAGNOSIS — Z3A34 34 weeks gestation of pregnancy: Secondary | ICD-10-CM

## 2020-02-25 DIAGNOSIS — O09293 Supervision of pregnancy with other poor reproductive or obstetric history, third trimester: Secondary | ICD-10-CM

## 2020-02-25 DIAGNOSIS — O34219 Maternal care for unspecified type scar from previous cesarean delivery: Secondary | ICD-10-CM | POA: Insufficient documentation

## 2020-02-25 DIAGNOSIS — O09213 Supervision of pregnancy with history of pre-term labor, third trimester: Secondary | ICD-10-CM | POA: Diagnosis not present

## 2020-02-26 ENCOUNTER — Other Ambulatory Visit (HOSPITAL_COMMUNITY): Payer: Medicaid Other

## 2020-02-26 ENCOUNTER — Other Ambulatory Visit (HOSPITAL_COMMUNITY): Payer: Self-pay | Admitting: *Deleted

## 2020-02-26 DIAGNOSIS — O365930 Maternal care for other known or suspected poor fetal growth, third trimester, not applicable or unspecified: Secondary | ICD-10-CM

## 2020-03-03 ENCOUNTER — Other Ambulatory Visit: Payer: Self-pay

## 2020-03-03 ENCOUNTER — Encounter (HOSPITAL_COMMUNITY): Payer: Self-pay

## 2020-03-03 ENCOUNTER — Ambulatory Visit (HOSPITAL_COMMUNITY): Payer: Medicaid Other | Admitting: *Deleted

## 2020-03-03 ENCOUNTER — Ambulatory Visit (HOSPITAL_COMMUNITY)
Admission: RE | Admit: 2020-03-03 | Discharge: 2020-03-03 | Disposition: A | Discharge status: Home / Self Care | Payer: Medicaid Other | Source: Ambulatory Visit | Attending: Obstetrics and Gynecology | Admitting: Obstetrics and Gynecology

## 2020-03-03 DIAGNOSIS — Z362 Encounter for other antenatal screening follow-up: Secondary | ICD-10-CM | POA: Diagnosis not present

## 2020-03-03 DIAGNOSIS — O365930 Maternal care for other known or suspected poor fetal growth, third trimester, not applicable or unspecified: Secondary | ICD-10-CM | POA: Diagnosis not present

## 2020-03-03 DIAGNOSIS — Z8759 Personal history of other complications of pregnancy, childbirth and the puerperium: Secondary | ICD-10-CM | POA: Diagnosis present

## 2020-03-03 DIAGNOSIS — O34219 Maternal care for unspecified type scar from previous cesarean delivery: Secondary | ICD-10-CM | POA: Insufficient documentation

## 2020-03-03 DIAGNOSIS — O99019 Anemia complicating pregnancy, unspecified trimester: Secondary | ICD-10-CM | POA: Insufficient documentation

## 2020-03-03 DIAGNOSIS — O09299 Supervision of pregnancy with other poor reproductive or obstetric history, unspecified trimester: Secondary | ICD-10-CM | POA: Diagnosis not present

## 2020-03-03 DIAGNOSIS — O09213 Supervision of pregnancy with history of pre-term labor, third trimester: Secondary | ICD-10-CM

## 2020-03-03 DIAGNOSIS — Z3A35 35 weeks gestation of pregnancy: Secondary | ICD-10-CM

## 2020-03-08 ENCOUNTER — Ambulatory Visit (HOSPITAL_COMMUNITY)
Admission: RE | Admit: 2020-03-08 | Discharge: 2020-03-08 | Disposition: A | Discharge status: Home / Self Care | Payer: Medicaid Other | Source: Ambulatory Visit | Attending: Obstetrics and Gynecology | Admitting: Obstetrics and Gynecology

## 2020-03-08 ENCOUNTER — Ambulatory Visit (HOSPITAL_COMMUNITY): Payer: Medicaid Other | Admitting: *Deleted

## 2020-03-08 ENCOUNTER — Other Ambulatory Visit: Payer: Self-pay

## 2020-03-08 ENCOUNTER — Encounter (HOSPITAL_COMMUNITY): Payer: Self-pay

## 2020-03-08 DIAGNOSIS — Z8759 Personal history of other complications of pregnancy, childbirth and the puerperium: Secondary | ICD-10-CM | POA: Insufficient documentation

## 2020-03-08 DIAGNOSIS — O09299 Supervision of pregnancy with other poor reproductive or obstetric history, unspecified trimester: Secondary | ICD-10-CM | POA: Insufficient documentation

## 2020-03-08 DIAGNOSIS — Z3A36 36 weeks gestation of pregnancy: Secondary | ICD-10-CM

## 2020-03-08 DIAGNOSIS — Z362 Encounter for other antenatal screening follow-up: Secondary | ICD-10-CM

## 2020-03-08 DIAGNOSIS — O365930 Maternal care for other known or suspected poor fetal growth, third trimester, not applicable or unspecified: Secondary | ICD-10-CM | POA: Diagnosis not present

## 2020-03-08 DIAGNOSIS — O99019 Anemia complicating pregnancy, unspecified trimester: Secondary | ICD-10-CM | POA: Insufficient documentation

## 2020-03-08 DIAGNOSIS — O34219 Maternal care for unspecified type scar from previous cesarean delivery: Secondary | ICD-10-CM | POA: Diagnosis present

## 2020-03-08 DIAGNOSIS — O09213 Supervision of pregnancy with history of pre-term labor, third trimester: Secondary | ICD-10-CM

## 2020-03-09 ENCOUNTER — Encounter (HOSPITAL_COMMUNITY): Payer: Self-pay | Admitting: *Deleted

## 2020-03-09 ENCOUNTER — Other Ambulatory Visit: Payer: Self-pay | Admitting: Advanced Practice Midwife

## 2020-03-09 ENCOUNTER — Telehealth (HOSPITAL_COMMUNITY): Payer: Self-pay | Admitting: *Deleted

## 2020-03-09 ENCOUNTER — Other Ambulatory Visit (HOSPITAL_COMMUNITY)
Admission: RE | Admit: 2020-03-09 | Discharge: 2020-03-09 | Disposition: A | Discharge status: Home / Self Care | Payer: Medicaid Other | Source: Ambulatory Visit | Attending: Certified Nurse Midwife | Admitting: Certified Nurse Midwife

## 2020-03-09 ENCOUNTER — Encounter: Payer: Self-pay | Admitting: Certified Nurse Midwife

## 2020-03-09 ENCOUNTER — Ambulatory Visit (INDEPENDENT_AMBULATORY_CARE_PROVIDER_SITE_OTHER): Payer: Medicaid Other | Admitting: Certified Nurse Midwife

## 2020-03-09 VITALS — BP 120/72 | HR 98 | Wt 157.0 lb

## 2020-03-09 DIAGNOSIS — Z3490 Encounter for supervision of normal pregnancy, unspecified, unspecified trimester: Secondary | ICD-10-CM | POA: Insufficient documentation

## 2020-03-09 DIAGNOSIS — O365990 Maternal care for other known or suspected poor fetal growth, unspecified trimester, not applicable or unspecified: Secondary | ICD-10-CM

## 2020-03-09 DIAGNOSIS — O34219 Maternal care for unspecified type scar from previous cesarean delivery: Secondary | ICD-10-CM

## 2020-03-09 HISTORY — DX: Maternal care for other known or suspected poor fetal growth, unspecified trimester, not applicable or unspecified: O36.5990

## 2020-03-09 NOTE — Progress Notes (Signed)
Patient reports fetal movement, denies pain. 

## 2020-03-09 NOTE — Telephone Encounter (Signed)
Preadmission screen  

## 2020-03-09 NOTE — Progress Notes (Signed)
   PRENATAL VISIT NOTE  Subjective:  Phyllis Alexander is a 24 y.o. G4P1011 at [redacted]w[redacted]d being seen today for ongoing prenatal care.  She is currently monitored for the following issues for this high-risk pregnancy and has Encounter for supervision of normal pregnancy, unspecified, unspecified trimester; Previous cesarean delivery affecting pregnancy, antepartum; History of pregnancy complication; Anemia, antepartum; Uterine size date discrepancy pregnancy, third trimester; and Pregnancy affected by fetal growth restriction on their problem list.  Patient reports no complaints.  Contractions: Not present. Vag. Bleeding: None.  Movement: Present. Denies leaking of fluid.   The following portions of the patient's history were reviewed and updated as appropriate: allergies, current medications, past family history, past medical history, past social history, past surgical history and problem list.   Objective:   Vitals:   03/09/20 0930  BP: 120/72  Pulse: 98  Weight: 157 lb (71.2 kg)    Fetal Status: Fetal Heart Rate (bpm): 138   Movement: Present  Presentation: Vertex  General:  Alert, oriented and cooperative. Patient is in no acute distress.  Skin: Skin is warm and dry. No rash noted.   Cardiovascular: Normal heart rate noted  Respiratory: Normal respiratory effort, no problems with respiration noted  Abdomen: Soft, gravid, appropriate for gestational age.  Pain/Pressure: Absent     Pelvic: Cervical exam performed in the presence of a chaperone Dilation: Closed Effacement (%): Thick Station: -3  Extremities: Normal range of motion.     Mental Status: Normal mood and affect. Normal behavior. Normal judgment and thought content.   Assessment and Plan:  Pregnancy: G4P1011 at [redacted]w[redacted]d 1. Encounter for supervision of normal pregnancy, antepartum, unspecified gravidity - Patient doing well, denies complaints or concerns  - Anticipatory guidance on upcoming appointments and induction of labor  -  Strep Gp B NAA - Cervicovaginal ancillary only( [place])  2. Previous cesarean delivery affecting pregnancy, antepartum - Patient plans TOLAC, consent signed today  - Educated and discussed risk/benefits of TOLAC   3. Pregnancy affected by fetal growth restriction - Patient has been followed by MFM, recent ultrasound yesterday  - Dr Judeth Cornfield recommends delivery at 37 weeks and discussed with patient yesterday  - Answered additional questions by patient - IOL scheduled for 5/3 @[facility] , orders for admission placed  - Discussed with patient plan for FB and low dose pitocin d/t hx of C/S, patient verbalizes understanding  - Est. FW:  2150  gm  4 lb 12 oz    3  %  Preterm labor symptoms and general obstetric precautions including but not limited to vaginal bleeding, contractions, leaking of fluid and fetal movement were reviewed in detail with the patient. Please refer to After Visit Summary for other counseling recommendations.   Return in about 6 weeks (around 04/20/2020) for POSTPARTUM.  Future Appointments  Date Time Provider Department Center  03/14/2020 12:00 AM [facility] SCHED ROOM [facility] None    05/14/2020, CNM

## 2020-03-09 NOTE — Patient Instructions (Signed)
New Induction of Labor Process for [facility]  In Fall 2020 [facility] changed it's process for scheduling inductions of labor to create more induction slots and to make sure patients get COVID-19 testing in advance. After you have been tested you need to quarantine so that you do not get infected after your test. You should not go anywhere after your test except necessary medical appointments.  You have been scheduled for induction of labor on 5/3 at 12:00 am (midnight).  Please arrive on 5/2 at 11:45 pm at Entrance C, Maternity Assessment Unit (MAU) security desk unless you are called to be placed on hold. You may eat a light meal before coming to the hospital.  If you go into labor, think your water has broken, experience bright red bleeding or don't feel your baby moving as much as usual before your induction, please call your Ob/Gyn's office or come to Entrance C, Maternity Assessment Unit for evaluation.  Thank you,  Center for [employer] [facility]

## 2020-03-09 NOTE — Progress Notes (Signed)
Induction Assessment Scheduling Form: Fax to [facility]:  [phone] Route to [facility] Labor Delivery   Phyllis Alexander                                                                                   DOB:  1996-10-13                                                            MRN:  [id number]  Phone:  Home Phone [phone]  Mobile [phone]    Provider:  [facility] (Fac[facility])  GP:  [id number]                                                            Estimated Date of Delivery: 04/04/20  Dating Criteria: LMP    Medical Indications for induction:  Fetal growth restriction, recommended delivery by MFM Admission Date/Time:  5/3 @ [facility] Gestational age on admission:  [redacted]w[redacted]d   Filed Weights   03/09/20 0930  Weight: 157 lb (71.2 kg)   HIV:  Non Reactive (03/09 0942) GBS:  pending     Method of induction(proposed):  Foley and low dose pitocin   Scheduling Provider Signature:  Sharyon Cable, [place]                                            Today's Date:  03/09/2020

## 2020-03-10 LAB — CERVICOVAGINAL ANCILLARY ONLY
Chlamydia: NEGATIVE
Comment: NEGATIVE
Comment: NEGATIVE
Comment: NORMAL
Neisseria Gonorrhea: NEGATIVE
Trichomonas: NEGATIVE

## 2020-03-11 LAB — STREP GP B NAA: Strep Gp B NAA: NEGATIVE

## 2020-03-12 ENCOUNTER — Other Ambulatory Visit (HOSPITAL_COMMUNITY)
Admission: RE | Admit: 2020-03-12 | Discharge: 2020-03-12 | Disposition: A | Discharge status: Home / Self Care | Payer: Medicaid Other | Source: Ambulatory Visit | Attending: Family Medicine | Admitting: Family Medicine

## 2020-03-12 DIAGNOSIS — Z01812 Encounter for preprocedural laboratory examination: Secondary | ICD-10-CM | POA: Insufficient documentation

## 2020-03-12 DIAGNOSIS — Z20822 Contact with and (suspected) exposure to covid-19: Secondary | ICD-10-CM | POA: Insufficient documentation

## 2020-03-12 LAB — SARS CORONAVIRUS 2 (TAT 6-24 HRS): SARS Coronavirus 2: NEGATIVE

## 2020-03-14 ENCOUNTER — Inpatient Hospital Stay (HOSPITAL_COMMUNITY): Payer: Medicaid Other

## 2020-03-14 ENCOUNTER — Inpatient Hospital Stay (HOSPITAL_COMMUNITY): Payer: Medicaid Other | Admitting: Anesthesiology

## 2020-03-14 ENCOUNTER — Inpatient Hospital Stay (HOSPITAL_COMMUNITY)
Admission: AD | Admit: 2020-03-14 | Discharge: 2020-03-16 | DRG: 807 | Disposition: A | Discharge status: Home / Self Care | Payer: Medicaid Other | Attending: Obstetrics and Gynecology | Admitting: Obstetrics and Gynecology

## 2020-03-14 ENCOUNTER — Encounter (HOSPITAL_COMMUNITY): Payer: Self-pay | Admitting: Family Medicine

## 2020-03-14 ENCOUNTER — Other Ambulatory Visit: Payer: Self-pay

## 2020-03-14 DIAGNOSIS — Z3A37 37 weeks gestation of pregnancy: Secondary | ICD-10-CM | POA: Diagnosis not present

## 2020-03-14 DIAGNOSIS — O34211 Maternal care for low transverse scar from previous cesarean delivery: Secondary | ICD-10-CM | POA: Diagnosis present

## 2020-03-14 DIAGNOSIS — O26843 Uterine size-date discrepancy, third trimester: Secondary | ICD-10-CM | POA: Diagnosis present

## 2020-03-14 DIAGNOSIS — Z87891 Personal history of nicotine dependence: Secondary | ICD-10-CM | POA: Diagnosis not present

## 2020-03-14 DIAGNOSIS — O365990 Maternal care for other known or suspected poor fetal growth, unspecified trimester, not applicable or unspecified: Secondary | ICD-10-CM | POA: Diagnosis present

## 2020-03-14 DIAGNOSIS — O34219 Maternal care for unspecified type scar from previous cesarean delivery: Secondary | ICD-10-CM

## 2020-03-14 DIAGNOSIS — O365930 Maternal care for other known or suspected poor fetal growth, third trimester, not applicable or unspecified: Secondary | ICD-10-CM | POA: Diagnosis present

## 2020-03-14 DIAGNOSIS — O9902 Anemia complicating childbirth: Secondary | ICD-10-CM | POA: Diagnosis present

## 2020-03-14 DIAGNOSIS — D649 Anemia, unspecified: Secondary | ICD-10-CM | POA: Diagnosis present

## 2020-03-14 DIAGNOSIS — O99019 Anemia complicating pregnancy, unspecified trimester: Secondary | ICD-10-CM | POA: Diagnosis present

## 2020-03-14 DIAGNOSIS — Z8759 Personal history of other complications of pregnancy, childbirth and the puerperium: Secondary | ICD-10-CM

## 2020-03-14 DIAGNOSIS — Z3490 Encounter for supervision of normal pregnancy, unspecified, unspecified trimester: Secondary | ICD-10-CM

## 2020-03-14 LAB — CBC
HCT: 29.5 % — ABNORMAL LOW (ref 36.0–46.0)
Hemoglobin: 9.2 g/dL — ABNORMAL LOW (ref 12.0–15.0)
MCH: 24.5 pg — ABNORMAL LOW (ref 26.0–34.0)
MCHC: 31.2 g/dL (ref 30.0–36.0)
MCV: 78.5 fL — ABNORMAL LOW (ref 80.0–100.0)
Platelets: 295 10*3/uL (ref 150–400)
RBC: 3.76 MIL/uL — ABNORMAL LOW (ref 3.87–5.11)
RDW: 14.9 % (ref 11.5–15.5)
WBC: 12.9 10*3/uL — ABNORMAL HIGH (ref 4.0–10.5)
nRBC: 0 % (ref 0.0–0.2)

## 2020-03-14 LAB — TYPE AND SCREEN
ABO/RH(D): A POS
Antibody Screen: NEGATIVE

## 2020-03-14 LAB — RPR: RPR Ser Ql: NONREACTIVE

## 2020-03-14 MED ORDER — PHENYLEPHRINE 40 MCG/ML (10ML) SYRINGE FOR IV PUSH (FOR BLOOD PRESSURE SUPPORT)
80.0000 ug | PREFILLED_SYRINGE | INTRAVENOUS | Status: DC | PRN
  Administered 2020-03-14 (×2): 80 ug via INTRAVENOUS

## 2020-03-14 MED ORDER — OXYTOCIN 10 UNIT/ML IJ SOLN: 10.0000 [IU] | Freq: Once | INTRAMUSCULAR | Status: AC

## 2020-03-14 MED ORDER — ACETAMINOPHEN 325 MG PO TABS
650.0000 mg | ORAL_TABLET | Freq: Four times a day (QID) | ORAL | Status: DC | PRN
  Administered 2020-03-14 – 2020-03-15 (×2): 650 mg via ORAL
  Filled 2020-03-14 (×2): qty 2

## 2020-03-14 MED ORDER — OXYTOCIN 40 UNITS IN NORMAL SALINE INFUSION - SIMPLE MED
2.5000 [IU]/h | INTRAVENOUS | Status: DC
  Filled 2020-03-14: qty 1000

## 2020-03-14 MED ORDER — LIDOCAINE HCL (PF) 1 % IJ SOLN: 30.0000 mL | INTRAMUSCULAR | Status: DC | PRN

## 2020-03-14 MED ORDER — OXYTOCIN BOLUS FROM INFUSION: 500.0000 mL | Freq: Once | INTRAVENOUS | Status: DC

## 2020-03-14 MED ORDER — OXYTOCIN 40 UNITS IN NORMAL SALINE INFUSION - SIMPLE MED
1.0000 m[IU]/min | INTRAVENOUS | Status: DC
  Administered 2020-03-14: 1 m[IU]/min via INTRAVENOUS

## 2020-03-14 MED ORDER — ONDANSETRON HCL 4 MG PO TABS: 4.0000 mg | ORAL_TABLET | ORAL | Status: DC | PRN

## 2020-03-14 MED ORDER — OXYCODONE-ACETAMINOPHEN 5-325 MG PO TABS: 1.0000 | ORAL_TABLET | ORAL | Status: DC | PRN

## 2020-03-14 MED ORDER — OXYTOCIN 10 UNIT/ML IJ SOLN
INTRAMUSCULAR | Status: AC
  Administered 2020-03-14: 10 [IU] via INTRAMUSCULAR
  Filled 2020-03-14: qty 1

## 2020-03-14 MED ORDER — MEDROXYPROGESTERONE ACETATE 150 MG/ML IM SUSP
150.0000 mg | Freq: Once | INTRAMUSCULAR | Status: AC
  Administered 2020-03-16: 150 mg via INTRAMUSCULAR
  Filled 2020-03-14: qty 1

## 2020-03-14 MED ORDER — COCONUT OIL OIL: 1.0000 "application " | TOPICAL_OIL | Status: DC | PRN

## 2020-03-14 MED ORDER — EPHEDRINE 5 MG/ML INJ: 10.0000 mg | INTRAVENOUS | Status: DC | PRN

## 2020-03-14 MED ORDER — WITCH HAZEL-GLYCERIN EX PADS: 1.0000 "application " | MEDICATED_PAD | CUTANEOUS | Status: DC | PRN

## 2020-03-14 MED ORDER — OXYTOCIN 40 UNITS IN NORMAL SALINE INFUSION - SIMPLE MED: 1.0000 m[IU]/min | INTRAVENOUS | Status: DC

## 2020-03-14 MED ORDER — SODIUM CHLORIDE (PF) 0.9 % IJ SOLN
INTRAMUSCULAR | Status: DC | PRN
  Administered 2020-03-14: 12 mL/h via EPIDURAL

## 2020-03-14 MED ORDER — LIDOCAINE HCL (PF) 1 % IJ SOLN
INTRAMUSCULAR | Status: DC | PRN
  Administered 2020-03-14 (×2): 4 mL via EPIDURAL

## 2020-03-14 MED ORDER — PHENYLEPHRINE 40 MCG/ML (10ML) SYRINGE FOR IV PUSH (FOR BLOOD PRESSURE SUPPORT)
80.0000 ug | PREFILLED_SYRINGE | INTRAVENOUS | Status: DC | PRN
  Filled 2020-03-14: qty 10

## 2020-03-14 MED ORDER — LACTATED RINGERS IV SOLN
500.0000 mL | Freq: Once | INTRAVENOUS | Status: AC
  Administered 2020-03-14: 1000 mL via INTRAVENOUS

## 2020-03-14 MED ORDER — OXYCODONE-ACETAMINOPHEN 5-325 MG PO TABS: 2.0000 | ORAL_TABLET | ORAL | Status: DC | PRN

## 2020-03-14 MED ORDER — SENNOSIDES-DOCUSATE SODIUM 8.6-50 MG PO TABS
2.0000 | ORAL_TABLET | ORAL | Status: DC
  Administered 2020-03-14 – 2020-03-15 (×2): 2 via ORAL
  Filled 2020-03-14 (×3): qty 2

## 2020-03-14 MED ORDER — TERBUTALINE SULFATE 1 MG/ML IJ SOLN: 0.2500 mg | Freq: Once | INTRAMUSCULAR | Status: DC | PRN

## 2020-03-14 MED ORDER — ONDANSETRON HCL 4 MG/2ML IJ SOLN: 4.0000 mg | Freq: Four times a day (QID) | INTRAMUSCULAR | Status: DC | PRN

## 2020-03-14 MED ORDER — DIPHENHYDRAMINE HCL 25 MG PO CAPS: 25.0000 mg | ORAL_CAPSULE | Freq: Four times a day (QID) | ORAL | Status: DC | PRN

## 2020-03-14 MED ORDER — FENTANYL CITRATE (PF) 100 MCG/2ML IJ SOLN
100.0000 ug | INTRAMUSCULAR | Status: DC | PRN
  Administered 2020-03-14 (×3): 100 ug via INTRAVENOUS
  Filled 2020-03-14 (×3): qty 2

## 2020-03-14 MED ORDER — BENZOCAINE-MENTHOL 20-0.5 % EX AERO
1.0000 "application " | INHALATION_SPRAY | CUTANEOUS | Status: DC | PRN
  Administered 2020-03-14: 1 via TOPICAL
  Filled 2020-03-14 (×2): qty 56

## 2020-03-14 MED ORDER — ONDANSETRON HCL 4 MG/2ML IJ SOLN: 4.0000 mg | INTRAMUSCULAR | Status: DC | PRN

## 2020-03-14 MED ORDER — IBUPROFEN 600 MG PO TABS
600.0000 mg | ORAL_TABLET | Freq: Three times a day (TID) | ORAL | Status: DC | PRN
  Administered 2020-03-14 – 2020-03-16 (×5): 600 mg via ORAL
  Filled 2020-03-14 (×5): qty 1

## 2020-03-14 MED ORDER — SOD CITRATE-CITRIC ACID 500-334 MG/5ML PO SOLN: 30.0000 mL | ORAL | Status: DC | PRN

## 2020-03-14 MED ORDER — PRENATAL MULTIVITAMIN CH
1.0000 | ORAL_TABLET | Freq: Every day | ORAL | Status: DC
  Filled 2020-03-14: qty 1

## 2020-03-14 MED ORDER — ACETAMINOPHEN 325 MG PO TABS: 650.0000 mg | ORAL_TABLET | ORAL | Status: DC | PRN

## 2020-03-14 MED ORDER — LACTATED RINGERS IV SOLN: INTRAVENOUS | Status: DC

## 2020-03-14 MED ORDER — DIBUCAINE (PERIANAL) 1 % EX OINT: 1.0000 "application " | TOPICAL_OINTMENT | CUTANEOUS | Status: DC | PRN

## 2020-03-14 MED ORDER — SIMETHICONE 80 MG PO CHEW: 80.0000 mg | CHEWABLE_TABLET | ORAL | Status: DC | PRN

## 2020-03-14 MED ORDER — LACTATED RINGERS IV SOLN
500.0000 mL | INTRAVENOUS | Status: DC | PRN
  Administered 2020-03-14: 1000 mL via INTRAVENOUS

## 2020-03-14 MED ORDER — TETANUS-DIPHTH-ACELL PERTUSSIS 5-2.5-18.5 LF-MCG/0.5 IM SUSP: 0.5000 mL | Freq: Once | INTRAMUSCULAR | Status: DC

## 2020-03-14 MED ORDER — MEASLES, MUMPS & RUBELLA VAC IJ SOLR: 0.5000 mL | Freq: Once | INTRAMUSCULAR | Status: DC

## 2020-03-14 MED ORDER — FENTANYL-BUPIVACAINE-NACL 0.5-0.125-0.9 MG/250ML-% EP SOLN
12.0000 mL/h | EPIDURAL | Status: DC | PRN
  Filled 2020-03-14: qty 250

## 2020-03-14 MED ORDER — DIPHENHYDRAMINE HCL 50 MG/ML IJ SOLN: 12.5000 mg | INTRAMUSCULAR | Status: DC | PRN

## 2020-03-14 MED ORDER — LACTATED RINGERS IV SOLN: 500.0000 mL | Freq: Once | INTRAVENOUS | Status: DC

## 2020-03-14 NOTE — Discharge Summary (Signed)
Postpartum Discharge Summary    Patient Name: Phyllis Alexander DOB: Feb 13, 1996 MRN: [id number]  Date of admission: 03/14/2020 Delivering Provider: Chauncey Mann   Date of discharge: 03/16/2020  Admitting diagnosis: Pregnancy affected by fetal growth restriction [O36.5990] Intrauterine pregnancy: [redacted]w[redacted]d    Secondary diagnosis:  Active Problems:   Encounter for supervision of normal pregnancy, unspecified, unspecified trimester   Previous cesarean delivery affecting pregnancy, antepartum   History of pregnancy complication   Anemia, antepartum   Uterine size date discrepancy pregnancy, third trimester   Pregnancy affected by fetal growth restriction   VBAC (vaginal birth after Cesarean)  Additional problems: None     Discharge diagnosis: Term Pregnancy Delivered and VBAC                                                                                                Post partum procedures:Depo shot  Augmentation: AROM, Pitocin and Foley Balloon  Complications: None  Hospital course:  Induction of Labor With Vaginal Delivery   24y.o. yo G4P1011 at 31w0das admitted to the hospital 03/14/2020 for induction of labor.  Indication for induction: FGR.  Patient had an uncomplicated labor course as follows: Initial SVE: 1.5/50/-3. Patient received Foley balloon, Pitocin and AROM. Received epidural. She then progressed to complete.  Membrane Rupture Time/Date: 11:32 AM ,03/14/2020   Intrapartum Procedures: Episiotomy: None [1]                                         Lacerations:  Labial [10]  Patient had delivery of a Viable infant.  Information for the patient's newborn:  WaReise, Hietala0[[id number]]Delivery Method: VBAC, Spontaneous(Filed from Delivery Summary)    03/14/2020  Details of delivery can be found in separate delivery note.  Patient had a routine postpartum course. Patient is discharged home 03/16/20. Delivery time: 11:47 AM    Magnesium Sulfate received: No BMZ received:  No Rhophylac:No MMR:No Transfusion:No  Physical exam  Vitals:   03/15/20 0509 03/15/20 1403 03/15/20 2148 03/16/20 0551  BP: 103/62 115/65 112/70 114/65  Pulse: 70 60 91 60  Resp: '16 18  18  ' Temp: 98.2 F (36.8 C) 98.8 F (37.1 C)  98.1 F (36.7 C)  TempSrc: Oral Oral  Oral  SpO2:  99% 99% 100%  Weight:      Height:       General: alert, cooperative and no distress Lochia: appropriate Uterine Fundus: firm Incision: N/A DVT Evaluation: No evidence of DVT seen on physical exam. No significant calf/ankle edema. Labs: Lab Results  Component Value Date   WBC 12.9 (H) 03/14/2020   HGB 9.2 (L) 03/14/2020   HCT 29.5 (L) 03/14/2020   MCV 78.5 (L) 03/14/2020   PLT 295 03/14/2020   CMP Latest Ref Rng & Units 08/22/2018  Glucose 70 - 99 mg/dL 81  BUN 6 - 20 mg/dL 6  Creatinine 0.44 - 1.00 mg/dL 0.56  Sodium 135 - 145 mmol/L 135  Potassium 3.5 - 5.1 mmol/L 3.9  Chloride 98 - 111 mmol/L 108  CO2 22 - 32 mmol/L 21(L)  Calcium 8.9 - 10.3 mg/dL 9.1  Total Protein 6.5 - 8.1 g/dL 6.1(L)  Total Bilirubin 0.3 - 1.2 mg/dL 0.5  Alkaline Phos 38 - 126 U/L 28(L)  AST 15 - 41 U/L 15  ALT 0 - 44 U/L 8   Edinburgh Score: Edinburgh Postnatal Depression Scale Screening Tool 03/15/2020  I have been able to laugh and see the funny side of things. 0  I have looked forward with enjoyment to things. 0  I have blamed myself unnecessarily when things went wrong. 0  I have been anxious or worried for no good reason. 0  I have felt scared or panicky for no good reason. 0  Things have been getting on top of me. 1  I have been so unhappy that I have had difficulty sleeping. 0  I have felt sad or miserable. 0  I have been so unhappy that I have been crying. 0  The thought of harming myself has occurred to me. 0  Edinburgh Postnatal Depression Scale Total 1    Discharge instruction: per After Visit Summary and "Baby and Me Booklet".  After visit meds:  Allergies as of 03/16/2020   No Known  Allergies     Medication List    TAKE these medications   acetaminophen 325 MG tablet Commonly known as: Tylenol Take 2 tablets (650 mg total) by mouth every 6 (six) hours as needed (for pain scale < 4).   Blood Pressure Cuff Misc 1 kit by Does not apply route daily.   Comfort Fit Maternity Supp Med Misc 1 Units by Does not apply route daily.   docusate sodium 50 MG capsule Commonly known as: COLACE Take 50 mg by mouth 2 (two) times daily.   ferrous sulfate 325 (65 FE) MG tablet Commonly known as: FerrouSul Take 1 tablet (325 mg total) by mouth 2 (two) times daily.   ibuprofen 600 MG tablet Commonly known as: ADVIL Take 1 tablet (600 mg total) by mouth every 8 (eight) hours as needed for mild pain.   polyethylene glycol powder 17 GM/SCOOP powder Commonly known as: GLYCOLAX/MIRALAX Take 17 g by mouth daily as needed.       Diet: routine diet  Activity: Advance as tolerated. Pelvic rest for 6 weeks.   Outpatient follow up:4 weeks Follow up Appt: Future Appointments  Date Time Provider Corn[place]  04/12/2020  2:20 PM Darlina Rumpf, CNM [facility] None   Follow up Visit:    Please schedule this patient for Postpartum visit in: 4 weeks with the following provider: Any provider Virtual For C/S patients schedule nurse incision check in weeks 2 weeks: no Low risk pregnancy complicated by: FGR Delivery mode:  SVD Anticipated Birth Control:  Depo PP Procedures needed: None  Schedule Integrated BH visit: no     Newborn Data: Live born female  Birth Weight: 4 lb 12 oz (2155 g) APGAR: 8, 9  Newborn Delivery   Birth date/time: 03/14/2020 11:47:00 Delivery type: VBAC, Spontaneous      Baby Feeding: Bottle and Breast Disposition:home w mother vs rooming in   03/16/2020 Clarnce Flock, MD

## 2020-03-14 NOTE — Anesthesia Procedure Notes (Signed)
Epidural Patient location during procedure: OB Start time: 03/14/2020 9:13 AM End time: 03/14/2020 9:16 AM  Staffing Anesthesiologist: Kaylyn Layer, MD Performed: anesthesiologist   Preanesthetic Checklist Completed: patient identified, IV checked, risks and benefits discussed, monitors and equipment checked, pre-op evaluation and timeout performed  Epidural Patient position: sitting Prep: DuraPrep and site prepped and draped Patient monitoring: continuous pulse ox, blood pressure and heart rate Approach: midline Location: L3-L4 Injection technique: LOR air  Needle:  Needle type: Tuohy  Needle gauge: 17 G Needle length: 9 cm Needle insertion depth: 7 cm Catheter type: closed end flexible Catheter size: 19 Gauge Catheter at skin depth: 12 cm Test dose: negative and Other (1% lidocaine)  Assessment Events: blood not aspirated, injection not painful, no injection resistance, no paresthesia and negative IV test  Additional Notes Patient identified. Risks, benefits, and alternatives discussed with patient including but not limited to bleeding, infection, nerve damage, paralysis, failed block, incomplete pain control, headache, blood pressure changes, nausea, vomiting, reactions to medication, itching, and postpartum back pain. Confirmed with bedside nurse the patient's most recent platelet count. Confirmed with patient that they are not currently taking any anticoagulation, have any bleeding history, or any family history of bleeding disorders. Patient expressed understanding and wished to proceed. All questions were answered. Sterile technique was used throughout the entire procedure. Please see nursing notes for vital signs.   Crisp LOR on first pass. Test dose was given through epidural catheter and negative prior to continuing to dose epidural or start infusion. Warning signs of high block given to the patient including shortness of breath, tingling/numbness in hands, complete  motor block, or any concerning symptoms with instructions to call for help. Patient was given instructions on fall risk and not to get out of bed. All questions and concerns addressed with instructions to call with any issues or inadequate analgesia.  Reason for block:procedure for pain

## 2020-03-14 NOTE — H&P (Addendum)
OBSTETRIC ADMISSION HISTORY AND PHYSICAL  Phyllis Alexander is a 24 y.o. female [phone] with IUP at 41w0dby LMP presenting for IOL for FGR. She reports +FMs, No LOF, no VB, no blurry vision, headaches or peripheral edema, and RUQ pain.  She plans on breast feeding. She request Depo for birth control. [facility]   Dating: By LMP --->  Estimated Date of Delivery: 04/04/20  Sono:   _0 , CWD, normal anatomy, cephalic presentation,  EFW:   2150 gm, (4 lb 12 oz), 3%lie  Prenatal History/Complications:  Fetal Growth Restriction- 3% Anemia H/o VBAC- 20 week nonviable twins, PPROM H/o PPH with 20 week twin delivery (1000) H/o Cesarean delivery (failure to progress at 3 cm)  Past Medical History: Past Medical History:  Diagnosis Date  . Medical history non-contributory     Past Surgical History: Past Surgical History:  Procedure Laterality Date  . CESAREAN SECTION    . DILATION AND CURETTAGE OF UTERUS    . WISDOM TOOTH EXTRACTION      Obstetrical History: OB History    Gravida  4   Para  2   Term  1   Preterm  0   AB  1   Living  1     SAB  0   TAB  1   Ectopic      Multiple  1   Live Births  3        Obstetric Comments  Previable twins delivery with PPROM        Social History Social History   Socioeconomic History  . Marital status: Single    Spouse name: Not on file  . Number of children: Not on file  . Years of education: Not on file  . Highest education level: Not on file  Occupational History  . Not on file  Tobacco Use  . Smoking status: Former Smoker    Packs/day: 0.50    Types: Cigarettes    Quit date: 08/15/2018    Years since quitting: 1.5  . Smokeless tobacco: Never Used  Substance and Sexual Activity  . Alcohol use: No  . Drug use: Never  . Sexual activity: Yes  Other Topics Concern  . Not on file  Social History Narrative  . Not on file   Social Determinants of Health   Financial Resource  Strain:   . Difficulty of Paying Living Expenses:   Food Insecurity:   . Worried About RCharity fundraiserin the Last Year:   . RArboriculturistin the Last Year:   Transportation Needs:   . LFilm/video editor(Medical):   .Marland KitchenLack of Transportation (Non-Medical):   Physical Activity:   . Days of Exercise per Week:   . Minutes of Exercise per Session:   Stress:   . Feeling of Stress :   Social Connections:   . Frequency of Communication with Friends and Family:   . Frequency of Social Gatherings with Friends and Family:   . Attends Religious Services:   . Active Member of Clubs or Organizations:   . Attends CArchivistMeetings:   .Marland KitchenMarital Status:     Family History: Family History  Problem Relation Age of Onset  . Diabetes Other   . ADD / ADHD Brother   . Hypertension Maternal Aunt   . Arthritis Maternal Grandmother   . Diabetes Maternal Grandmother   . Hypertension Maternal Grandmother   . Stroke Maternal Grandmother   .  Arthritis Paternal Grandmother     Allergies: No Known Allergies  Medications Prior to Admission  Medication Sig Dispense Refill Last Dose  . docusate sodium (COLACE) 50 MG capsule Take 50 mg by mouth 2 (two) times daily.   Past Week at Unknown time  . ferrous sulfate (FERROUSUL) 325 (65 FE) MG tablet Take 1 tablet (325 mg total) by mouth 2 (two) times daily. 60 tablet 1 Past Week at Unknown time  . Blood Pressure Monitoring (BLOOD PRESSURE CUFF) MISC 1 kit by Does not apply route daily. 1 each 0   . Elastic Bandages & Supports (COMFORT FIT MATERNITY SUPP MED) MISC 1 Units by Does not apply route daily. 1 each 0      Review of Systems   All systems reviewed and negative except as stated in HPI  Height _0  (1.727 m), weight 71.7 kg, last menstrual period 06/29/2019, unknown if currently breastfeeding. General appearance: alert and no distress Lungs: clear to auscultation bilaterally Heart: regular rate and rhythm Abdomen: soft,  non-tender; bowel sounds normal Pelvic: gravid uterus Extremities: Homans sign is negative, no sign of DVT Presentation: cephalic Fetal monitoringBaseline: 125 bpm, Variability: Good {> 6 bpm), Accelerations: Reactive and Decelerations: Absent Uterine activityFrequency: Every 3-5 minutes Dilation: 1.5 Effacement (%): 50 Exam by:: Dr. Justice Britain RN   Prenatal labs: ABO, Rh: --/--/A POS (05/03 0024) Antibody: NEG (05/03 0024) Rubella: 3.23 (12/30 1408) RPR: Non Reactive (03/09 0942)  HBsAg: Negative (12/30 1408)  HIV: Non Reactive (03/09 0942)  GBS: Negative/-- (04/28 0951)  2 hr Glucola (76, 103, 75) Genetic screening: low risk NIPS, negative AFP Anatomy US normal  Prenatal Transfer Tool  Maternal Diabetes: No Genetic Screening: Normal Maternal Ultrasounds/Referrals: IUGR Fetal Ultrasounds or other Referrals:  None Maternal Substance Abuse:  No Significant Maternal Medications:  None Significant Maternal Lab Results: Group B Strep negative  Results for orders placed or performed during the hospital encounter of 03/14/20 (from the past 24 hour(s))  Type and screen   Collection Time: 03/14/20 12:24 AM  Result Value Ref Range   ABO/RH(D) A POS    Antibody Screen NEG    Sample Expiration      03/17/2020,2359 Performed at [facility], [address] [place], [place] [address]   CBC   Collection Time: 03/14/20 12:29 AM  Result Value Ref Range   WBC 12.9 (H) 4.0 - 10.5 K/uL   RBC 3.76 (L) 3.87 - 5.11 MIL/uL   Hemoglobin 9.2 (L) 12.0 - 15.0 g/dL   HCT 29.5 (L) 36.0 - 46.0 %   MCV 78.5 (L) 80.0 - 100.0 fL   MCH 24.5 (L) 26.0 - 34.0 pg   MCHC 31.2 30.0 - 36.0 g/dL   RDW 14.9 11.5 - 15.5 %   Platelets 295 150 - 400 K/uL   nRBC 0.0 0.0 - 0.2 %    Patient Active Problem List   Diagnosis Date Noted  . Pregnancy affected by fetal growth restriction 03/09/2020  . Uterine size date discrepancy pregnancy, third trimester 02/10/2020  . Anemia, antepartum  11/23/2019  . Encounter for supervision of normal pregnancy, unspecified, unspecified trimester 11/11/2019  . Previous cesarean delivery affecting pregnancy, antepartum 11/11/2019  . History of pregnancy complication 72/53/6644    Assessment/Plan:  Keirra Zeimet is a 24 y.o. G4P1011 at 68w0dhere for IOL for FGR.  #Labor: Admit to L&D. Patient desires TOLAC. Foley bulb placed at this check without difficulty by Dr. SDarene Lamer Start low-dose pitocin, hold at 6 until  FB out.  #Pain: Per pt request, recommend early epidural 2/2 TOLAC #FWB: Cat 1, EFW 2400 g #ID:  None, GBS neagtive #MOF: breast #MOC: Depo  #Circ:  NA, female  Lyndee Hensen, DO, PGY-1 03/14/20, 1:20 AM  GME ATTESTATION:  I saw and evaluated the patient. I agree with the findings and the plan of care as documented in the resident's note with addition of the following:  Reviewed risks/benefits of TOLAC versus RCS in detail. Patient counseled regarding potential vaginal delivery, chance of success, future implications, possible uterine rupture and need for urgent/emergent repeat cesarean. Counseled regarding potential need for repeat c-section for reasons unrelated to first c-section. Counseled regarding scheduled repeat cesarean including risks of bleeding, infection, damage to surrounding tissue, abnormal placentation, implications for future pregnancies. All questions answered.  Patient desires TOLAC, consent signed 03/09/20.   Risks and benefits of induction were reviewed, including failure of method, prolonged labor, need for further intervention, risk of cesarean.  Patient and family seem to understand these risks and wish to proceed. Options of foley bulb, AROM, and pitocin reviewed, with use of each discussed.  Patient also counseled regarding FGR baby, 3%ile, and that need for rLTCS may be needed for fetal distress.  Merilyn Baba, DO OB Fellow, [facility] 03/14/2020 1:40 AM

## 2020-03-14 NOTE — Progress Notes (Signed)
Labor Progress Note Phyllis Alexander is a 24 y.o. G4P1011 at 56w0dpresented for IOL for FGR. S: Met patient and discussed plan. Very uncomfortable with ctx.  O:  BP 132/83   Pulse 68   Temp 97.9 F (36.6 C) (Oral)   Resp 20   Ht _0  (1.727 m)   Wt 71.7 kg   LMP 06/29/2019 (Exact Date)   BMI 24.02 kg/m  EFM: 120, moderate variability, pos accels, occasional variable decels, reactive TOCO: q2-385mCVE: Dilation: 5.5 Effacement (%): 80 Cervical Position: Anterior Station: -2 Presentation: Vertex Exam by:: Dr. FaMarice Potter A&P: 89.o. [id number] for IOL for FGR. #Labor: Progressing well. Good cervical change. S/p FB. Cont Pit. Patient desires epidural and then can consider AROM. Anticipate VBAC. #Pain: per patient request #FWB: Cat II; reassuring for moderate variability and accels #GBS negative  CheChauncey MannD 8:47 AM

## 2020-03-14 NOTE — Anesthesia Postprocedure Evaluation (Signed)
Anesthesia Post Note  Patient: Phyllis Alexander  Procedure(s) Performed: AN AD HOC LABOR EPIDURAL     Patient location during evaluation: Mother Baby Anesthesia Type: Epidural Level of consciousness: awake Pain management: satisfactory to patient Vital Signs Assessment: post-procedure vital signs reviewed and stable Respiratory status: spontaneous breathing Cardiovascular status: stable Anesthetic complications: no    Last Vitals:  Vitals:   03/14/20 1415 03/14/20 1517  BP: 108/63 110/62  Pulse: (!) 55 63  Resp: 18 16  Temp: 36.6 C 37 C  SpO2: 99% 100%    Last Pain:  Vitals:   03/14/20 1517  TempSrc: Oral  PainSc: 0-No pain   Pain Goal: Patients Stated Pain Goal: 9 (03/14/20 0718)                 Cephus Shelling

## 2020-03-14 NOTE — Anesthesia Preprocedure Evaluation (Addendum)
Anesthesia Evaluation  Patient identified by MRN, date of birth, ID band Patient awake    Reviewed: Allergy & Precautions, Patient's Chart, lab work & pertinent test results  History of Anesthesia Complications Negative for: history of anesthetic complications  Airway Mallampati: II  TM Distance: >3 FB Neck ROM: Full    Dental no notable dental hx. (+) Teeth Intact   Pulmonary former smoker,    Pulmonary exam normal breath sounds clear to auscultation       Cardiovascular negative cardio ROS Normal cardiovascular exam Rhythm:Regular Rate:Normal     Neuro/Psych negative neurological ROS     GI/Hepatic negative GI ROS, Neg liver ROS,   Endo/Other  negative endocrine ROS  Renal/GU negative Renal ROS     Musculoskeletal negative musculoskeletal ROS (+)   Abdominal   Peds  Hematology  (+) anemia , Hgb 9.2    Anesthesia Other Findings   Reproductive/Obstetrics (+) Pregnancy                            Anesthesia Physical Anesthesia Plan  ASA: II  Anesthesia Plan: Epidural   Post-op Pain Management:    Induction:   PONV Risk Score and Plan: Treatment may vary due to age or medical condition  Airway Management Planned: Natural Airway  Additional Equipment:   Intra-op Plan:   Post-operative Plan:   Informed Consent: I have reviewed the patients History and Physical, chart, labs and discussed the procedure including the risks, benefits and alternatives for the proposed anesthesia with the patient or authorized representative who has indicated his/her understanding and acceptance.       Plan Discussed with: CRNA  Anesthesia Plan Comments:        Anesthesia Quick Evaluation

## 2020-03-15 ENCOUNTER — Ambulatory Visit (HOSPITAL_COMMUNITY): Payer: Medicaid Other

## 2020-03-15 ENCOUNTER — Encounter (HOSPITAL_COMMUNITY): Payer: Self-pay

## 2020-03-15 NOTE — Progress Notes (Addendum)
Post Partum Day 1 Subjective: Patient doing well.  Reports voiding and having flatus.  Pt ambulatory in the room and is eating well. Pain is well controlled though she is having occasional cramping.    Objective: Blood pressure 103/62, pulse 70, temperature 98.2 F (36.8 C), temperature source Oral, resp. rate 16, height 5' 8" (1.727 m), weight 71.7 kg, last menstrual period 06/29/2019, SpO2 98 %, unknown if currently breastfeeding.  Physical Exam:  General: alert and no distress Lochia: appropriate Uterine Fundus: firm Incision: NA DVT Evaluation: No evidence of DVT seen on physical exam.  Recent Labs    03/14/20 0029  HGB 9.2*  HCT 29.5*    Assessment/Plan: Plan for discharge tomorrow and Breastfeeding.   LOS: 1 day   Phyllis Brimage, DO PGY-1, [facility] 03/15/2020 7:28 AM     I personally saw and evaluated the patient, performing the key elements of the service. I developed and verified the management plan that is described in the resident's/student's note, and I agree with the content with my edits above. VSS, HRR&R, Resp unlabored, Legs neg.  Fran Cresenzo-Dishmon, CNM 03/15/2020 7:33 AM    

## 2020-03-16 MED ORDER — ACETAMINOPHEN 325 MG PO TABS: 650.0000 mg | ORAL_TABLET | Freq: Four times a day (QID) | ORAL | 0 refills | Status: DC | PRN

## 2020-03-16 MED ORDER — POLYETHYLENE GLYCOL 3350 17 GM/SCOOP PO POWD
17.0000 g | Freq: Every day | ORAL | 1 refills | Status: DC | PRN
Start: 2020-03-16 — End: 2020-08-02

## 2020-03-16 MED ORDER — IBUPROFEN 600 MG PO TABS: 600.0000 mg | ORAL_TABLET | Freq: Three times a day (TID) | ORAL | 0 refills | Status: DC | PRN

## 2020-03-16 NOTE — Discharge Instructions (Signed)

## 2020-03-17 ENCOUNTER — Inpatient Hospital Stay (HOSPITAL_COMMUNITY)
Admission: AD | Admit: 2020-03-17 | Discharge: 2020-03-18 | Disposition: A | Discharge status: Home / Self Care | Payer: Medicaid Other | Attending: Obstetrics and Gynecology | Admitting: Obstetrics and Gynecology

## 2020-03-17 ENCOUNTER — Other Ambulatory Visit: Payer: Self-pay

## 2020-03-17 DIAGNOSIS — Z87891 Personal history of nicotine dependence: Secondary | ICD-10-CM | POA: Insufficient documentation

## 2020-03-17 DIAGNOSIS — O9279 Other disorders of lactation: Secondary | ICD-10-CM | POA: Diagnosis present

## 2020-03-17 DIAGNOSIS — O9089 Other complications of the puerperium, not elsewhere classified: Secondary | ICD-10-CM

## 2020-03-17 DIAGNOSIS — O99893 Other specified diseases and conditions complicating puerperium: Secondary | ICD-10-CM | POA: Diagnosis not present

## 2020-03-17 DIAGNOSIS — R519 Headache, unspecified: Secondary | ICD-10-CM | POA: Insufficient documentation

## 2020-03-17 LAB — URINALYSIS, ROUTINE W REFLEX MICROSCOPIC
Bilirubin Urine: NEGATIVE
Glucose, UA: NEGATIVE mg/dL
Ketones, ur: NEGATIVE mg/dL
Nitrite: NEGATIVE
Protein, ur: 100 mg/dL — AB
RBC / HPF: >50 RBC/hpf — ABNORMAL HIGH (ref 0–5)
Specific Gravity, Urine: 1.033 — ABNORMAL HIGH (ref 1.005–1.030)
pH: 5 (ref 5.0–8.0)

## 2020-03-17 MED ORDER — IBUPROFEN 600 MG PO TABS
600.0000 mg | ORAL_TABLET | Freq: Once | ORAL | Status: AC
  Administered 2020-03-17: 600 mg via ORAL
  Filled 2020-03-17: qty 1

## 2020-03-17 MED ORDER — PSEUDOEPHEDRINE HCL 30 MG PO TABS
60.0000 mg | ORAL_TABLET | Freq: Once | ORAL | Status: AC
  Administered 2020-03-17: 23:00:00 60 mg via ORAL
  Filled 2020-03-17: qty 2

## 2020-03-17 MED ORDER — PSEUDOEPHEDRINE HCL 30 MG PO TABS
60.0000 mg | ORAL_TABLET | ORAL | 0 refills | Status: DC | PRN
Start: 2020-03-17 — End: 2020-08-02

## 2020-03-17 MED ORDER — OXYCODONE-ACETAMINOPHEN 5-325 MG PO TABS
1.0000 | ORAL_TABLET | Freq: Once | ORAL | Status: AC
  Administered 2020-03-17: 23:00:00 1 via ORAL
  Filled 2020-03-17: qty 1

## 2020-03-17 NOTE — MAU Note (Addendum)
Pt had vag del. 03/14/20. Had IOL due to IUGR but pt reports no other preg concerns. Today breast have been very sore and full, having some lower abd and pelvic pain. Normal vag bleeding. Is not breast feeding. Breasts are full but pt does not think there is redness. Chills. Slight headache. Just picked up her meds on way to hospital tonight due to Medicaid issues so has not taken anything for pain. Feels abd and pelvic pain are due to breasts pressing down on abdomen and pelvis Baby is still in hospital but plans d/c tomorrow if wt gain is good

## 2020-03-17 NOTE — MAU Note (Signed)
Lactation consulted.

## 2020-03-17 NOTE — Lactation Note (Signed)
Lactation Consultation Note Called by MAU d/t mom crying d/t Engorged. Having a lot of breast pain. Mom stated it was to painful to pump and nothing would come out of one breast. One breast leaking a lot. Mom wearing thin net bra w/nursing pad that was soaked w/BM. Mom is formula feeding her baby. Doesn't want to BF. NP wanted LC to talk w/mom on how to dry up her milk since she wasn't BF the baby. Discussed w/mom ways of drying up milk. Explained it was a painful process and isn't an over night process.  Suggested: Tight fitting bra ICE Raw Cabbage leaves to breast Drink Sage tea Take Ibuprofen as directed by MD  NP asked what could she give mom to help. LC suggested sudafed.   Mom's Lt. Breast was full all the way up to axillary area. Mom has a hard large knot at the axillary of Lt. Breast. Encouraged mom to Ice and lay flat after LC hand expressed and relieved mom some from the breast pressure. Discussed w/mom until she is able to get the cabbage and drink the sage tea she may have to do some expression every few hours to just relieve some of the pressure. Mom stated she would do that. The Rt. Breast has holes from where her nipples were pierced. The Lt. Nipple piercing holes has closed up and doesn't leak like the Rt. Breast.  Noted 2 pustula's to Rt. Breast. Encouraged mom to show to the Dr.  Wendee Copp mom to give the baby her BM. Milk storage reviewed.  Patient Name: Phyllis Alexander [id number] Date: 03/17/2020 Reason for consult: Initial assessment;Engorgement;Early term 37-38.6wks;Infant < 6lbs   Maternal Data    Feeding    LATCH Score          Comfort (Breast/Nipple): Engorged, cracked, bleeding, large blisters, severe discomfort        Interventions Interventions: Ice;Breast compression;Reverse pressure;Hand express;Breast massage  Lactation Tools Discussed/Used     Consult Status Consult Status: Complete Date: 03/17/20    Charyl Dancer 03/17/2020,  11:58 PM

## 2020-03-17 NOTE — MAU Provider Note (Signed)
Chief Complaint:  Breast Pain, Abdominal Pain, Chills, and Pelvic Pain   First Provider Initiated Contact with Patient 03/17/20 2205       HPI: Phyllis Alexander is a 24 y.o. [id number] who presents to maternity admissions reporting breast engorgement and some abdominal pain.  Also has a slight headache, thinks it is because she was crying from the breast pain.  Has not tried any meds because she just picked them up on the way here.. She reports vaginal bleeding, but no vaginal itching/burning, urinary symptoms, dizziness, n/v, or fever/chills.    Abdominal Pain This is a new problem. The current episode started today. The onset quality is gradual. The problem occurs intermittently. The problem has been unchanged. The quality of the pain is aching (feels it is due to breasts laying on abdomen). Pertinent negatives include no constipation, diarrhea, fever or nausea. Nothing aggravates the pain. The pain is relieved by nothing. She has tried nothing for the symptoms.   RN Note: Pt had vag del. 03/14/20. Had IOL due to IUGR but pt reports no other preg concerns. Today breast have been very sore and full, having some lower abd and pelvic pain. Normal vag bleeding. Is not breast feeding. Breasts are full but pt does not think there is redness. Chills. Slight headache. Just picked up her meds on way to hospital tonight due to Medicaid issues so has not taken anything for pain. Feels abd and pelvic pain are due to breasts pressing down on abdomen and pelvis Baby is still in hospital but plans d/c tomorrow if wt gain is good  Past Medical History: Past Medical History:  Diagnosis Date  . Medical history non-contributory     Past obstetric history: OB History  Gravida Para Term Preterm AB Living  _0 0 1 2  SAB TAB Ectopic Multiple Live Births  0 1 0 1 4    # Outcome Date GA Lbr Len/2nd Weight Sex Delivery Anes PTL Lv  4 Term 03/14/20 [id number]:46 / 00:15 2155 g F VBAC EPI  LIV     Birth Comments: WNL   3A Para 10/22/18 239w5d1:40 / 00:55 283 g F Vag-Spont EPI  ND  3B Para 10/22/18 2074w5d:40 / 01:13 289 g F Vag-Spont EPI  ND  2 TAB           1 Term      CS-LVertical   LIV    Obstetric Comments  Previable twins delivery with PPROM    Past Surgical History: Past Surgical History:  Procedure Laterality Date  . CESAREAN SECTION    . DILATION AND CURETTAGE OF UTERUS    . WISDOM TOOTH EXTRACTION      Family History: Family History  Problem Relation Age of Onset  . Diabetes Other   . ADD / ADHD Brother   . Hypertension Maternal Aunt   . Arthritis Maternal Grandmother   . Diabetes Maternal Grandmother   . Hypertension Maternal Grandmother   . Stroke Maternal Grandmother   . Arthritis Paternal Grandmother     Social History: Social History   Tobacco Use  . Smoking status: Former Smoker    Packs/day: 0.50    Types: Cigarettes    Quit date: 08/15/2018    Years since quitting: 1.5  . Smokeless tobacco: Never Used  Substance Use Topics  . Alcohol use: No  . Drug use: Never    Allergies: No Known Allergies  Meds:  Medications Prior to Admission  Medication Sig Dispense  Refill Last Dose  . acetaminophen (TYLENOL) 325 MG tablet Take 2 tablets (650 mg total) by mouth every 6 (six) hours as needed (for pain scale < 4). 30 tablet 0   . Blood Pressure Monitoring (BLOOD PRESSURE CUFF) MISC 1 kit by Does not apply route daily. 1 each 0   . docusate sodium (COLACE) 50 MG capsule Take 50 mg by mouth 2 (two) times daily.     . Elastic Bandages & Supports (COMFORT FIT MATERNITY SUPP MED) MISC 1 Units by Does not apply route daily. 1 each 0   . ferrous sulfate (FERROUSUL) 325 (65 FE) MG tablet Take 1 tablet (325 mg total) by mouth 2 (two) times daily. 60 tablet 1   . ibuprofen (ADVIL) 600 MG tablet Take 1 tablet (600 mg total) by mouth every 8 (eight) hours as needed for mild pain. 30 tablet 0   . polyethylene glycol powder (GLYCOLAX/MIRALAX) 17 GM/SCOOP powder Take 17 g by mouth  daily as needed. 510 g 1     I have reviewed patient's Past Medical Hx, Surgical Hx, Family Hx, Social Hx, medications and allergies.  ROS:  Review of Systems  Constitutional: Negative for chills and fever.  Respiratory:       Breasts engorged and painful   Cardiovascular: Negative for leg swelling.  Gastrointestinal: Negative for constipation, diarrhea and nausea.  Genitourinary: Positive for vaginal bleeding. Negative for pelvic pain.  Musculoskeletal: Negative for back pain.  Neurological: Negative for dizziness and weakness.   Other systems negative     Physical Exam   Patient Vitals for the past 24 hrs:  BP Pulse SpO2 Height Weight  03/17/20 2110 127/75 91 99 % _0  (1.727 m) 65.8 kg   Constitutional: Well-developed, well-nourished female in no acute distress.  Cardiovascular: normal rate and rhythm, no ectopy audible, S1 & S2 heard, no murmur Breasts:  Bilateral engorgement, tight and tender.  +milk  No erethema or abscess Respiratory: normal effort, no distress. Lungs CTAB with no wheezes or crackles GI: Abd soft, non-tender.  Nondistended.  No rebound, No guarding.  Bowel Sounds audible  MS: Extremities nontender, no edema, normal ROM Neurologic: Alert and oriented x 4.   Grossly nonfocal. GU: Neg CVAT. Skin:  Warm and Dry Psych:  Affect appropriate.  PELVIC EXAM: deferred, lochia within normal limits   Labs: Results for orders placed or performed during the hospital encounter of 03/17/20 (from the past 24 hour(s))  Urinalysis, Routine w reflex microscopic     Status: Abnormal   Collection Time: 03/17/20  9:17 PM  Result Value Ref Range   Color, Urine YELLOW YELLOW   APPearance HAZY (A) CLEAR   Specific Gravity, Urine 1.033 (H) 1.005 - 1.030   pH 5.0 5.0 - 8.0   Glucose, UA NEGATIVE NEGATIVE mg/dL   Hgb urine dipstick LARGE (A) NEGATIVE   Bilirubin Urine NEGATIVE NEGATIVE   Ketones, ur NEGATIVE NEGATIVE mg/dL   Protein, ur 100 (A) NEGATIVE mg/dL    Nitrite NEGATIVE NEGATIVE   Leukocytes,Ua SMALL (A) NEGATIVE   RBC / HPF >50 (H) 0 - 5 RBC/hpf   WBC, UA 21-50 0 - 5 WBC/hpf   Bacteria, UA RARE (A) NONE SEEN   Squamous Epithelial / LPF 0-5 0 - 5   Mucus PRESENT    Hyaline Casts, UA PRESENT     --/--/A POS (05/03 0024)  Imaging:    MAU Course/MDM: I have ordered labs as follows: UA which is normal for postpartum Imaging ordered: none  Consult Lactation nurse who came and saw patient. She did some expression of milk to lessen pressure in breasts.  She recommended Sudafed, tight bra and cold packs.  .   Treatments in MAU included see above.  Also gave her ibuprofen and a Percocet. Headache resolved with these.  Sudafed given for engorgement Pt stable at time of discharge.  Assessment: Bilateral breast engorgement Headache, resolved  Plan: Discharge home Recommend care for breasts as instructed by Lactation consultant Rx sent for sudafed for engorgement  Encouraged to return here or to other Urgent Care/ED if she develops worsening of symptoms, increase in pain, fever, or other concerning symptoms.   Hansel Feinstein CNM, MSN Certified Nurse-Midwife 03/17/2020 10:05 PM

## 2020-03-17 NOTE — Discharge Instructions (Signed)
Breast Engorgement Breast engorgement is the overfilling of your breasts with breast milk. It is usually caused by delaying feedings, which can cause milk to build up. Breast engorgement can happen at any time while you are breast feeding, and is normal in the first 3-5 days after giving birth. The condition can make your breasts feel heavy, full, hard, tightly stretched, warm, and tender. Breast engorgement should improve within 24-48 hours of feeding your baby or expressing your milk. Follow these instructions at home: When to breastfeed or pump  Breastfeed when your baby shows signs of hunger. This is called "breastfeeding on demand."  Breastfeed or use a breast pump to remove milk from your breasts when you feel the need to reduce the fullness of your breasts.  If your baby is younger than 1 month, make sure you are breastfeeding every 1-3 hours during the day. You may need to wake up your baby to feed if he or she is asleep at a feeding time.  Do not allow your baby to sleep longer than 5 hours during the night without a feeding.  Do not delay feedings.  If you are returning to work or are away from home for an extended period, try to pump your milk on the same schedule as when your baby would breastfeed. Before breastfeeding or pumping:  Increase the circulation in your breasts and help your milk flow. Try either of these methods: ? Taking a warm shower. ? Applying warm, water-soaked hand towels to your breasts. ? Massaging your breasts.  Pump or hand-express breast milk before breastfeeding to soften your breast, areola, and nipple. During breastfeeding or pumping:  Try to relax when it is time to feed your baby. This helps to trigger your "let-down reflex," which releases milk from your breast.  Ensure your baby is latched on to your breast and positioned properly while breastfeeding.  Empty your breasts completely when breastfeeding or pumping.  Allow your baby to remain at  your breast as long as he or she is latched on well and sucking. Your baby will let you know when he or she is done breastfeeding by pulling away from your breast or falling asleep.  Massage your breasts to help your milk flow. Managing pain and swelling   Take over-the-counter and prescription medicines only as told by your health care provider.  If directed, put ice on your breasts: ? Put ice in a plastic bag. ? Place a towel between your skin and the bag. ? Leave the ice on for 20 minutes, 2-3 times a day.  If you feel pain while breastfeeding, take your baby off your breast and try again. General instructions  After breastfeeding or pumping wear a snug bra or tank top for 1-2 days. This will signal your body to slightly decrease how much milk it makes. Once the engorgement passes, make sure you to wear a well-fitted, supportive bra and regular clothes.  Drink enough fluid to keep your urine clear or pale yellow.  Avoid introducing bottles or pacifiers to your baby in the early weeks of breastfeeding. Wait to introduce these things until after resolving any breastfeeding challenges. Contact a health care provider if:  Engorgement lasts longer than 2 days, even after treatment.  You have flu-like symptoms, such as a fever, chills, or body aches.  You have nausea or you vomit.  Your breasts become red and painful.  You have a lump in your breast.  Your nipples continue to crack or start to  ooze.  There is yellow discharge coming from a nipple.  You have pain while breastfeeding, and it does not go away once you take your baby off your breast and try again. Get help right away if:  There is pus or blood in your breast milk.  You have sudden, severe symptoms.  You have red streaks near your breast.  Both breasts appear infected and you cannot breastfeed. Summary  Breast engorgement is the overfilling of your breasts with breast milk. It is usually caused by delayed  feeding.  Although it is normal to experience breast engorgement 3-5 days after giving birth, it can happen at any time while breastfeeding.  Do not delay feedings. Breastfeed on demand to help prevent engorgement.  Increase the circulation in your breasts and help your milk flow before feeding your baby. You can do this by taking a warm shower, applying warm water-soaked hand towels, or massaging your breasts. This information is not intended to replace advice given to you by your health care provider. Make sure you discuss any questions you have with your health care provider. Document Revised: 10/11/2017 Document Reviewed: 12/03/2016 Elsevier Patient Education  2020 [employer]  

## 2020-03-26 ENCOUNTER — Other Ambulatory Visit: Payer: Self-pay

## 2020-03-26 ENCOUNTER — Encounter (HOSPITAL_COMMUNITY): Payer: Self-pay | Admitting: Emergency Medicine

## 2020-03-26 ENCOUNTER — Emergency Department (HOSPITAL_COMMUNITY)
Admission: EM | Admit: 2020-03-26 | Discharge: 2020-03-26 | Disposition: A | Discharge status: Home / Self Care | Payer: Medicaid Other | Attending: Emergency Medicine | Admitting: Emergency Medicine

## 2020-03-26 DIAGNOSIS — Z79899 Other long term (current) drug therapy: Secondary | ICD-10-CM | POA: Insufficient documentation

## 2020-03-26 DIAGNOSIS — Z87891 Personal history of nicotine dependence: Secondary | ICD-10-CM | POA: Insufficient documentation

## 2020-03-26 DIAGNOSIS — R1030 Lower abdominal pain, unspecified: Secondary | ICD-10-CM | POA: Insufficient documentation

## 2020-03-26 DIAGNOSIS — N61 Mastitis without abscess: Secondary | ICD-10-CM | POA: Diagnosis not present

## 2020-03-26 DIAGNOSIS — Z20822 Contact with and (suspected) exposure to covid-19: Secondary | ICD-10-CM | POA: Diagnosis not present

## 2020-03-26 DIAGNOSIS — R509 Fever, unspecified: Secondary | ICD-10-CM | POA: Diagnosis present

## 2020-03-26 LAB — URINALYSIS, ROUTINE W REFLEX MICROSCOPIC
Bilirubin Urine: NEGATIVE
Glucose, UA: NEGATIVE mg/dL
Ketones, ur: NEGATIVE mg/dL
Nitrite: NEGATIVE
Protein, ur: NEGATIVE mg/dL
Specific Gravity, Urine: 1.006 (ref 1.005–1.030)
pH: 7 (ref 5.0–8.0)

## 2020-03-26 LAB — HCG, QUANTITATIVE, PREGNANCY: hCG, Beta Chain, Quant, S: 10 m[IU]/mL — ABNORMAL HIGH (ref <5)

## 2020-03-26 LAB — CBC WITH DIFFERENTIAL/PLATELET
Abs Immature Granulocytes: 0.06 10*3/uL (ref 0.00–0.07)
Basophils Absolute: 0 10*3/uL (ref 0.0–0.1)
Basophils Relative: 0 %
Eosinophils Absolute: 0.1 10*3/uL (ref 0.0–0.5)
Eosinophils Relative: 1 %
HCT: 35.7 % — ABNORMAL LOW (ref 36.0–46.0)
Hemoglobin: 11.3 g/dL — ABNORMAL LOW (ref 12.0–15.0)
Immature Granulocytes: 0 %
Lymphocytes Relative: 4 %
Lymphs Abs: 0.6 10*3/uL — ABNORMAL LOW (ref 0.7–4.0)
MCH: 24.9 pg — ABNORMAL LOW (ref 26.0–34.0)
MCHC: 31.7 g/dL (ref 30.0–36.0)
MCV: 78.6 fL — ABNORMAL LOW (ref 80.0–100.0)
Monocytes Absolute: 0.2 10*3/uL (ref 0.1–1.0)
Monocytes Relative: 1 %
Neutro Abs: 15.7 10*3/uL — ABNORMAL HIGH (ref 1.7–7.7)
Neutrophils Relative %: 94 %
Platelets: 321 10*3/uL (ref 150–400)
RBC: 4.54 MIL/uL (ref 3.87–5.11)
RDW: 17.6 % — ABNORMAL HIGH (ref 11.5–15.5)
WBC: 16.8 10*3/uL — ABNORMAL HIGH (ref 4.0–10.5)
nRBC: 0 % (ref 0.0–0.2)

## 2020-03-26 LAB — I-STAT BETA HCG BLOOD, ED (MC, WL, AP ONLY): I-stat hCG, quantitative: 13.4 m[IU]/mL — ABNORMAL HIGH (ref <5)

## 2020-03-26 LAB — LACTIC ACID, PLASMA
Lactic Acid, Venous: 2.1 mmol/L (ref 0.5–1.9)
Lactic Acid, Venous: 0.7 mmol/L (ref 0.5–1.9)

## 2020-03-26 LAB — COMPREHENSIVE METABOLIC PANEL
ALT: 30 U/L (ref 0–44)
AST: 21 U/L (ref 15–41)
Albumin: 3.3 g/dL — ABNORMAL LOW (ref 3.5–5.0)
Alkaline Phosphatase: 60 U/L (ref 38–126)
Anion gap: 14 (ref 5–15)
BUN: 5 mg/dL — ABNORMAL LOW (ref 6–20)
CO2: 19 mmol/L — ABNORMAL LOW (ref 22–32)
Calcium: 9.4 mg/dL (ref 8.9–10.3)
Chloride: 102 mmol/L (ref 98–111)
Creatinine, Ser: 0.84 mg/dL (ref 0.44–1.00)
GFR calc Af Amer: >60 mL/min (ref >60)
GFR calc non Af Amer: >60 mL/min (ref >60)
Glucose, Bld: 104 mg/dL — ABNORMAL HIGH (ref 70–99)
Potassium: 3.6 mmol/L (ref 3.5–5.1)
Sodium: 135 mmol/L (ref 135–145)
Total Bilirubin: 0.7 mg/dL (ref 0.3–1.2)
Total Protein: 7.4 g/dL (ref 6.5–8.1)

## 2020-03-26 LAB — WET PREP, GENITAL
Clue Cells Wet Prep HPF POC: NONE SEEN
Sperm: NONE SEEN
Trich, Wet Prep: NONE SEEN
Yeast Wet Prep HPF POC: NONE SEEN

## 2020-03-26 LAB — POC SARS CORONAVIRUS 2 AG -  ED: SARS Coronavirus 2 Ag: NEGATIVE

## 2020-03-26 MED ORDER — SODIUM CHLORIDE 0.9 % IV SOLN
1.0000 g | Freq: Once | INTRAVENOUS | Status: DC
  Filled 2020-03-26: qty 10

## 2020-03-26 MED ORDER — SODIUM CHLORIDE 0.9% FLUSH
3.0000 mL | Freq: Once | INTRAVENOUS | Status: AC
  Administered 2020-03-26: 3 mL via INTRAVENOUS

## 2020-03-26 MED ORDER — ONDANSETRON HCL 4 MG PO TABS: 4.0000 mg | ORAL_TABLET | Freq: Three times a day (TID) | ORAL | 0 refills | Status: DC | PRN

## 2020-03-26 MED ORDER — ONDANSETRON HCL 4 MG/2ML IJ SOLN
4.0000 mg | Freq: Once | INTRAMUSCULAR | Status: AC
  Administered 2020-03-26: 4 mg via INTRAVENOUS
  Filled 2020-03-26: qty 2

## 2020-03-26 MED ORDER — ACETAMINOPHEN 500 MG PO TABS
1000.0000 mg | ORAL_TABLET | Freq: Once | ORAL | Status: AC
  Administered 2020-03-26: 1000 mg via ORAL
  Filled 2020-03-26: qty 2

## 2020-03-26 MED ORDER — CEFADROXIL 500 MG PO CAPS: 500.0000 mg | ORAL_CAPSULE | Freq: Two times a day (BID) | ORAL | 0 refills | Status: AC

## 2020-03-26 MED ORDER — SODIUM CHLORIDE 0.9 % IV BOLUS
1000.0000 mL | Freq: Once | INTRAVENOUS | Status: AC
  Administered 2020-03-26: 1000 mL via INTRAVENOUS

## 2020-03-26 MED ORDER — ACETAMINOPHEN 325 MG PO TABS
650.0000 mg | ORAL_TABLET | Freq: Once | ORAL | Status: AC | PRN
  Administered 2020-03-26: 650 mg via ORAL
  Filled 2020-03-26: qty 2

## 2020-03-26 MED ORDER — DICLOXACILLIN SODIUM 250 MG PO CAPS
500.0000 mg | ORAL_CAPSULE | Freq: Once | ORAL | Status: AC
  Administered 2020-03-26: 500 mg via ORAL
  Filled 2020-03-26: qty 2

## 2020-03-26 MED ORDER — IBUPROFEN 600 MG PO TABS
600.0000 mg | ORAL_TABLET | Freq: Four times a day (QID) | ORAL | 0 refills | Status: DC | PRN
Start: 2020-03-26 — End: 2020-07-21

## 2020-03-26 MED ORDER — ACETAMINOPHEN 500 MG PO TABS
500.0000 mg | ORAL_TABLET | Freq: Four times a day (QID) | ORAL | 0 refills | Status: DC | PRN
Start: 2020-03-26 — End: 2020-08-02

## 2020-03-26 MED ORDER — DICLOXACILLIN SODIUM 500 MG PO CAPS
500.0000 mg | ORAL_CAPSULE | Freq: Once | ORAL | Status: DC
  Filled 2020-03-26 (×2): qty 1

## 2020-03-26 NOTE — Discharge Instructions (Signed)
Your work-up today shows evidence of a breast infection called mastitis.  Please see the attached information for further recommendations.  The OB/GYN I spoke with recommends pumping which can be helpful.  He can also apply warm compresses 3 or 4 times daily for 20 minutes at a time.  Please take all of your antibiotics until finished!   Take your antibiotics with food.  Common side effects of antibiotics include nausea, vomiting, abdominal discomfort, and diarrhea. You may help offset some of this with probiotics which you can buy or get in yogurt. Do not eat  or take the probiotics until 2 hours after your antibiotic.    Please call the OB/GYN on Monday morning to schedule close follow-up for sometime this week.  Return to the emergency department or go to the MAU (maternal assessment unit) at entrance C at [facility] if you develop any concerning signs or symptoms such as persistently elevated fevers, persistent vomiting, severe uncontrolled pain, severe swelling.  This is where you will receive specialized OB/GYN treatment for any emergent conditions.

## 2020-03-26 NOTE — ED Provider Notes (Signed)
[facility] EMERGENCY DEPARTMENT Provider Note   CSN: [id number] Arrival date & time: 03/26/20  1446     History Chief Complaint  Patient presents with  . Fever  . Generalized Body Aches    Phyllis Alexander is a 24 y.o. female presenting for evaluation of acute onset, persistent fevers, chills, myalgias, lower abdominal pain since yesterday.  She is 12 days postpartum, delivered vaginally on 03/14/2020.  Reports that yesterday she began to feel unwell.  Over the last 2 or 3 days she has noticed that her vaginal bleeding has changed and feels as though she is expelling more clots and has some vaginal discharge.  She notes constant lower abdominal pain which worsens with attempts to urinate or have a bowel movement.  She has had nausea but no vomiting.  Denies chest pain, shortness of breath, cough or known sick contacts.  She denies urinary symptoms.  She also notes that since delivering she has had some engorgement of her breasts and her right breast has been leaking breastmilk "constantly" since giving birth.  She states that yesterday however it stopped leaking breastmilk and she has noticed some greenish drainage coming from the nipple.  She reports some soreness to the right breast.  She has been taking ibuprofen and Tylenol. She is not currently breast-feeding.  The history is provided by the patient.       Past Medical History:  Diagnosis Date  . Medical history non-contributory     Patient Active Problem List   Diagnosis Date Noted  . VBAC (vaginal birth after Cesarean) 03/14/2020  . Pregnancy affected by fetal growth restriction 03/09/2020  . Uterine size date discrepancy pregnancy, third trimester 02/10/2020  . Anemia, antepartum 11/23/2019  . Encounter for supervision of normal pregnancy, unspecified, unspecified trimester 11/11/2019  . Previous cesarean delivery affecting pregnancy, antepartum 11/11/2019  . History of pregnancy complication 08/81/1031     Past Surgical History:  Procedure Laterality Date  . CESAREAN SECTION    . DILATION AND CURETTAGE OF UTERUS    . WISDOM TOOTH EXTRACTION       OB History    Gravida  4   Para  3   Term  2   Preterm  0   AB  1   Living  2     SAB  0   TAB  1   Ectopic  0   Multiple  1   Live Births  4        Obstetric Comments  Previable twins delivery with PPROM        Family History  Problem Relation Age of Onset  . Diabetes Other   . ADD / ADHD Brother   . Hypertension Maternal Aunt   . Arthritis Maternal Grandmother   . Diabetes Maternal Grandmother   . Hypertension Maternal Grandmother   . Stroke Maternal Grandmother   . Arthritis Paternal Grandmother     Social History   Tobacco Use  . Smoking status: Former Smoker    Packs/day: 0.50    Types: Cigarettes    Quit date: 08/15/2018    Years since quitting: 1.6  . Smokeless tobacco: Never Used  Substance Use Topics  . Alcohol use: No  . Drug use: Never    Home Medications Prior to Admission medications   Medication Sig Start Date End Date Taking? Authorizing Provider  pseudoephedrine (SUDAFED) 30 MG tablet Take 2 tablets (60 mg total) by mouth every 4 (four) hours as needed for congestion (  breast engorgement). 03/17/20  Yes Seabron Spates, CNM  acetaminophen (TYLENOL) 500 MG tablet Take 1 tablet (500 mg total) by mouth every 6 (six) hours as needed. 03/26/20   Francie Keeling A, PA-C  Blood Pressure Monitoring (BLOOD PRESSURE CUFF) MISC 1 kit by Does not apply route daily. 11/11/19   Lavonia Drafts, MD  cefadroxil (DURICEF) 500 MG capsule Take 1 capsule (500 mg total) by mouth 2 (two) times daily for 10 days. 03/26/20 04/05/20  Renita Papa, PA-C  Elastic Bandages & Supports (COMFORT FIT MATERNITY SUPP MED) MISC 1 Units by Does not apply route daily. 02/10/20   Luvenia Redden, PA-C  ferrous sulfate (FERROUSUL) 325 (65 FE) MG tablet Take 1 tablet (325 mg total) by mouth 2 (two) times daily. Patient  not taking: Reported on 03/26/2020 02/10/20   Luvenia Redden, PA-C  ibuprofen (ADVIL) 600 MG tablet Take 1 tablet (600 mg total) by mouth every 6 (six) hours as needed. 03/26/20   Wadie Mattie A, PA-C  ondansetron (ZOFRAN) 4 MG tablet Take 1 tablet (4 mg total) by mouth every 8 (eight) hours as needed for nausea or vomiting. 03/26/20   Nils Flack, Fannie Gathright A, PA-C  polyethylene glycol powder (GLYCOLAX/MIRALAX) 17 GM/SCOOP powder Take 17 g by mouth daily as needed. Patient not taking: Reported on 03/26/2020 03/16/20   Clarnce Flock, MD    Allergies    Patient has no known allergies.  Review of Systems   Review of Systems  Constitutional: Positive for chills and fever.  Respiratory: Negative for shortness of breath.   Cardiovascular: Negative for chest pain.  Gastrointestinal: Positive for abdominal pain.  Genitourinary: Positive for vaginal bleeding and vaginal discharge. Negative for dysuria.       +breast pain   Musculoskeletal: Positive for myalgias.  All other systems reviewed and are negative.    Physical Exam Updated Vital Signs BP (!) 127/110   Pulse 78   Temp 98.1 F (36.7 C) (Oral)   Resp 20   SpO2 98%   Physical Exam Vitals and nursing note reviewed.  Constitutional:      General: She is not in acute distress.    Appearance: She is well-developed.  HENT:     Head: Normocephalic and atraumatic.     Mouth/Throat:     Comments: No tonsillar hypertrophy or exudates or erythema noted.  No uvular deviation.  Tolerating secretions without difficulty. Eyes:     General:        Right eye: No discharge.        Left eye: No discharge.     Conjunctiva/sclera: Conjunctivae normal.  Neck:     Vascular: No JVD.     Trachea: No tracheal deviation.  Cardiovascular:     Rate and Rhythm: Regular rhythm. Tachycardia present.  Pulmonary:     Effort: Pulmonary effort is normal.     Breath sounds: Normal breath sounds.  Chest:     Comments: Examination performed in the presence of a  chaperone.  Right breast with erythema and induration.  Tender to palpation.  No nipple inversion or drainage. Abdominal:     General: Abdomen is flat. Bowel sounds are normal. There is no distension.     Palpations: Abdomen is soft.     Tenderness: There is abdominal tenderness in the right lower quadrant, suprapubic area and left lower quadrant. There is no right CVA tenderness, left CVA tenderness, guarding or rebound.  Genitourinary:    Comments: Examination performed in the presence  of a chaperone.  No masses or lesions to the external genitalia.  Moderate amount of red-brown discharge in the vaginal vault.  Cervix is not friable in appearance.  No purulent drainage coming from the cervix.  No cervical motion tenderness or adnexal tenderness.  No suprapubic tenderness on exam. Musculoskeletal:     Cervical back: Normal range of motion and neck supple.  Skin:    General: Skin is warm and dry.     Findings: No erythema.  Neurological:     Mental Status: She is alert.  Psychiatric:        Behavior: Behavior normal.     ED Results / Procedures / Treatments   Labs (all labs ordered are listed, but only abnormal results are displayed) Labs Reviewed  WET PREP, GENITAL - Abnormal; Notable for the following components:      Result Value   WBC, Wet Prep HPF POC MANY (*)    All other components within normal limits  LACTIC ACID, PLASMA - Abnormal; Notable for the following components:   Lactic Acid, Venous 2.1 (*)    All other components within normal limits  COMPREHENSIVE METABOLIC PANEL - Abnormal; Notable for the following components:   CO2 19 (*)    Glucose, Bld 104 (*)    BUN 5 (*)    Albumin 3.3 (*)    All other components within normal limits  CBC WITH DIFFERENTIAL/PLATELET - Abnormal; Notable for the following components:   WBC 16.8 (*)    Hemoglobin 11.3 (*)    HCT 35.7 (*)    MCV 78.6 (*)    MCH 24.9 (*)    RDW 17.6 (*)    Neutro Abs 15.7 (*)    Lymphs Abs 0.6 (*)     All other components within normal limits  URINALYSIS, ROUTINE W REFLEX MICROSCOPIC - Abnormal; Notable for the following components:   APPearance HAZY (*)    Hgb urine dipstick LARGE (*)    Leukocytes,Ua LARGE (*)    Bacteria, UA RARE (*)    Non Squamous Epithelial 0-5 (*)    All other components within normal limits  HCG, QUANTITATIVE, PREGNANCY - Abnormal; Notable for the following components:   hCG, Beta Chain, Quant, S 10 (*)    All other components within normal limits  I-STAT BETA HCG BLOOD, ED ([facility], [facility], [facility] ONLY) - Abnormal; Notable for the following components:   I-stat hCG, quantitative 13.4 (*)    All other components within normal limits  LACTIC ACID, PLASMA  POC SARS CORONAVIRUS 2 AG -  ED  GC/CHLAMYDIA PROBE AMP ([place]) NOT AT [facility]    EKG None  Radiology No results found.  Procedures Procedures (including critical care time)  Medications Ordered in ED Medications  sodium chloride flush (NS) 0.9 % injection 3 mL (3 mLs Intravenous Given 03/26/20 1723)  acetaminophen (TYLENOL) tablet 650 mg (650 mg Oral Given 03/26/20 1457)  ondansetron (ZOFRAN) injection 4 mg (4 mg Intravenous Given 03/26/20 1722)  sodium chloride 0.9 % bolus 1,000 mL (0 mLs Intravenous Stopped 03/26/20 1914)  acetaminophen (TYLENOL) tablet 1,000 mg (1,000 mg Oral Given 03/26/20 1723)  dicloxacillin (DYNAPEN) capsule 500 mg (500 mg Oral Given 03/26/20 2108)    ED Course  I have reviewed the triage vital signs and the nursing notes.  Pertinent labs & imaging results that were available during my care of the patient were reviewed by me and considered in my medical decision making (see chart for details).  Clinical Course  as of Mar 27 1646  Sat Mar 26, 2020  1738 24 yo female here 2 weeks post-partum with fever, chills, red breast redness and swelling.  On exam she has significant tenderness and diffuse erythema of the right breast.  She reports green drainage at home.  Concerning for mastoiditis  vs breast abscess.  Labs notable for WBC 16.8, Lactic acid 2.1.  Temp 102.8F on exam.  Patient reporting intermittent sharp lower abdominal pain, delivered vaginally, no C-section.  Pending discussion with OBGYN team.    [MT]  5397 AST: 21 [MF]    Clinical Course User Index [MF] Renita Papa, PA-C [MT] Langston Masker Carola Rhine, MD   MDM Rules/Calculators/A&P                      Patient presenting for evaluation of fever, right breast pain, lower abdominal pain beginning yesterday.  She presents to the ED febrile at 102.5 F, tachycardic, vital signs otherwise stable.  Vital signs improved with administration of antipyretic.  On examination her abdomen is soft with mild lower abdominal discomfort on palpation but no rebound or guarding.  Her right breast is erythematous, indurated and markedly tender to palpation.  No drainage noted from the nipple.  No fluctuance noted on examination.  Her lungs are clear to auscultation bilaterally and I doubt pneumonia.  Posterior oropharynx shows no evidence of strep pharyngitis or peritonsillar abscess.  No concern for deep space neck infection or meningitis.  Differential considerations for fever include mastitis, breast abscess, endometritis.  I have a low suspicion of acute surgical abdominal pathology at this time.  Her pelvic examination yields scant red-brown discharge, no purulence, no cervical motion tenderness or adnexal tenderness.  No tenderness to palpation of the uterus.  Based on this, I have a lower suspicion of endometritis.  Suspect that her fever is likely due to right breast mastitis.  Lab work reviewed and interpreted by myself show leukocytosis of 16.8, mild anemia, no renal insufficiency or metabolic derangements.  She has a mildly elevated lactic acid level that improved with administration of IV fluids on recheck.   CONSUL: Discussed work-up and physical examination with Dr. Harolyn Rutherford with on-call OB/GYN service.  She advises that it is not  unusual to experience high fevers in the setting of mastitis.  She has a low suspicion of endometritis or pelvic infection at this time given reassuring physical examination.  Given resolution of fever, improvement in lactic acid level and that the patient is tolerating p.o. food and fluids in the ED she feels that the patient would benefit from discharge home.  She recommends starting the patient on cefadroxil twice daily for 10 days.  She would like for the patient to follow-up in the office this week.  On reevaluation the patient is resting comfortably in no apparent distress.  She is tolerating sips of fluid in the ED without difficulty.  She reports that she is feeling much better after administration of fluids and Tylenol.  We discussed OB/GYN recommendations and she understands to follow-up on an outpatient basis.  Discussed strict ED return precautions. Patient verbalized understanding of and agreement with plan and is safe for discharge home at this time.  Final Clinical Impression(s) / ED Diagnoses Final diagnoses:  Mastitis, acute    Rx / DC Orders ED Discharge Orders         Ordered    cefadroxil (DURICEF) 500 MG capsule  2 times daily     03/26/20 2004  ondansetron (ZOFRAN) 4 MG tablet  Every 8 hours PRN     03/26/20 2004    acetaminophen (TYLENOL) 500 MG tablet  Every 6 hours PRN     03/26/20 2004    ibuprofen (ADVIL) 600 MG tablet  Every 6 hours PRN     03/26/20 2004           Renita Papa, PA-C 03/27/20 1657    Wyvonnia Dusky, MD 03/27/20 2333

## 2020-03-26 NOTE — ED Triage Notes (Signed)
Pt reports fever of 101 today, started having body aches, cough and diarrhea yesterday, denies sick contacts. resp e/u, nad.

## 2020-03-28 LAB — GC/CHLAMYDIA PROBE AMP (~~LOC~~) NOT AT ARMC
Chlamydia: NEGATIVE
Comment: NEGATIVE
Comment: NORMAL
Neisseria Gonorrhea: NEGATIVE

## 2020-03-31 ENCOUNTER — Ambulatory Visit (INDEPENDENT_AMBULATORY_CARE_PROVIDER_SITE_OTHER): Payer: Medicaid Other | Admitting: Obstetrics

## 2020-03-31 ENCOUNTER — Other Ambulatory Visit: Payer: Self-pay

## 2020-03-31 ENCOUNTER — Encounter: Payer: Self-pay | Admitting: Obstetrics

## 2020-03-31 VITALS — BP 125/78 | HR 87 | Ht 68.0 in | Wt 142.8 lb

## 2020-03-31 DIAGNOSIS — O9122 Nonpurulent mastitis associated with the puerperium: Secondary | ICD-10-CM | POA: Diagnosis not present

## 2020-03-31 NOTE — Progress Notes (Signed)
Patient presents for follow up from Ed visit for mastitis. Patient is still taking her antibiotics. She has states that her symptoms are better, and knows to continue taking antibiotics for the full time.   PPD: 0

## 2020-03-31 NOTE — Progress Notes (Signed)
Patient ID: Phyllis Alexander, female   DOB: November 11, 1996, 24 y.o.   MRN: [id number]  Chief Complaint  Patient presents with  . Follow-up    HPI Phyllis Alexander is a 24 y.o. female.  ED follow up for fever/chills with postpartum mastitis.  Started on [place] and is much less tender, and has no fever. HPI  Past Medical History:  Diagnosis Date  . Medical history non-contributory     Past Surgical History:  Procedure Laterality Date  . CESAREAN SECTION    . DILATION AND CURETTAGE OF UTERUS    . WISDOM TOOTH EXTRACTION      Family History  Problem Relation Age of Onset  . Diabetes Other   . ADD / ADHD Brother   . Hypertension Maternal Aunt   . Arthritis Maternal Grandmother   . Diabetes Maternal Grandmother   . Hypertension Maternal Grandmother   . Stroke Maternal Grandmother   . Arthritis Paternal Grandmother     Social History Social History   Tobacco Use  . Smoking status: Former Smoker    Packs/day: 0.50    Types: Cigarettes    Quit date: 08/15/2018    Years since quitting: 1.6  . Smokeless tobacco: Never Used  Substance Use Topics  . Alcohol use: No  . Drug use: Never    No Known Allergies  Current Outpatient Medications  Medication Sig Dispense Refill  . acetaminophen (TYLENOL) 500 MG tablet Take 1 tablet (500 mg total) by mouth every 6 (six) hours as needed. 30 tablet 0  . Blood Pressure Monitoring (BLOOD PRESSURE CUFF) MISC 1 kit by Does not apply route daily. 1 each 0  . cefadroxil (DURICEF) 500 MG capsule Take 1 capsule (500 mg total) by mouth 2 (two) times daily for 10 days. 20 capsule 0  . ferrous sulfate (FERROUSUL) 325 (65 FE) MG tablet Take 1 tablet (325 mg total) by mouth 2 (two) times daily. 60 tablet 1  . ibuprofen (ADVIL) 600 MG tablet Take 1 tablet (600 mg total) by mouth every 6 (six) hours as needed. 30 tablet 0  . ondansetron (ZOFRAN) 4 MG tablet Take 1 tablet (4 mg total) by mouth every 8 (eight) hours as needed for nausea or vomiting. 10 tablet  0  . polyethylene glycol powder (GLYCOLAX/MIRALAX) 17 GM/SCOOP powder Take 17 g by mouth daily as needed. 510 g 1  . Elastic Bandages & Supports (COMFORT FIT MATERNITY SUPP MED) MISC 1 Units by Does not apply route daily. (Patient not taking: Reported on 03/31/2020) 1 each 0  . pseudoephedrine (SUDAFED) 30 MG tablet Take 2 tablets (60 mg total) by mouth every 4 (four) hours as needed for congestion (breast engorgement). 30 tablet 0   No current facility-administered medications for this visit.    Review of Systems Review of Systems Constitutional: negative for fatigue and weight loss Respiratory: negative for cough and wheezing Cardiovascular: negative for chest pain, fatigue and palpitations Gastrointestinal: negative for abdominal pain and change in bowel habits Genitourinary:negative Integument/breast: positive for mild breast tenderness, bilateral Musculoskeletal:negative for myalgias Neurological: negative for gait problems and tremors Behavioral/Psych: negative for abusive relationship, depression Endocrine: negative for temperature intolerance      Blood pressure 125/78, pulse 87, height '5\' 8"'  (1.727 m), weight 142 lb 12.8 oz (64.8 kg), not currently breastfeeding.  Physical Exam Physical Exam General:   alert and no distress  Skin:   no rash or abnormalities  Lungs:   clear to auscultation bilaterally  Heart:   regular rate and  rhythm, S1, S2 normal, no murmur, click, rub or gallop  Breasts:   normal without suspicious masses, skin or nipple changes or axillary nodes  Abdomen:  normal findings: no organomegaly, soft, non-tender and no hernia    50% of 20 min visit spent on counseling and coordination of care.   Data Reviewed Cultures  Assessment     1. Mastitis, postpartum, resolving - continue [place]  Follow up in 2 weeks   Shelly Bombard, MD 03/31/2020 4:11 PM

## 2020-04-12 ENCOUNTER — Telehealth (INDEPENDENT_AMBULATORY_CARE_PROVIDER_SITE_OTHER): Payer: Medicaid Other | Admitting: Advanced Practice Midwife

## 2020-04-12 DIAGNOSIS — O34219 Maternal care for unspecified type scar from previous cesarean delivery: Secondary | ICD-10-CM

## 2020-04-12 NOTE — Progress Notes (Signed)
° ° ° °  I connected with Phyllis Alexander on 04/12/20 at  2:20 PM EDT by: Mychart video and verified that I am speaking with the correct person using two identifiers.  Patient is located at work and provider is located at [employer] for [employer] at [place] .     The purpose of this virtual visit is to provide medical care while limiting exposure to the novel coronavirus. I discussed the limitations, risks, security and privacy concerns of performing an evaluation and management service by [facility] and S. Ronson Hagins, CNM and the availability of in person appointments. I also discussed with the patient that there may be a patient responsible charge related to this service. By engaging in this virtual visit, you consent to the provision of healthcare.  Additionally, you authorize for your insurance to be billed for the services provided during this visit.  The patient expressed understanding and agreed to proceed.  The following staff members participated in the virtual visit:  Clayton Bibles, CNM  Post Partum Visit Note Subjective:   Phyllis Alexander is a 24 y.o. [phone] female being evaluated for postpartum followup.  She is 1 month postpartum following a normal spontaneous vaginal delivery at  37 gestational weeks.  I have fully reviewed the prenatal and intrapartum course; pregnancy complicated by history of cesarean section.  Postpartum course has been unremarkable. Baby is doing well unremarkable Baby is feeding by bottle - Similac Neosure. Bleeding thin lochia. Bowel function is normal. Bladder function is normal. Patient is not sexually active. Contraception method is Depo-Provera injections. Postpartum depression screening: negative=0  The following portions of the patient's history were reviewed and updated as appropriate: allergies, current medications, past family history, past medical history, past social history, past surgical history and problem list.  Review of Systems A comprehensive  review of systems was negative.   Objective:  There were no vitals filed for this visit. Self-Obtained       Assessment:   1. Normal MyChart postpartum exam. 2. Contraception with Depo Provera initiated prior to hospital discharge 3. Normal pap 09/09/2018 4. All Mastitis symptoms resolved s/p ABX per patient   Plan:  Essential components of care per ACOG recommendations:  1.  Mood and well being: Patient with negative depression screening today.   2. Infant care and feeding:  -Patient currently breastmilk feeding? Yes   3. Sexuality, contraception and birth spacing - Patient does not want a pregnancy in the next year.  Depo at hospital discharge  4. Sleep and fatigue -Encouraged family/partner/community support of 4 hrs of uninterrupted sleep to help with mood and fatigue  5. Physical Recovery  - Discussed patients successful VBAC and complications - Patient had bilateral labial lacerations, patient denied complaints or concerns - Patient has urinary incontinence? No - Patient is safe to resume physical and sexual activity   Seven minutes of non-face-to-face time spent with the patient   Clayton Bibles, MSN, CNM Certified Nurse Midwife, [employer] for [employer], [facility] 04/12/20 2:59 PM

## 2020-04-12 NOTE — Patient Instructions (Signed)

## 2020-06-07 ENCOUNTER — Ambulatory Visit: Payer: Medicaid Other

## 2020-07-20 ENCOUNTER — Other Ambulatory Visit: Payer: Self-pay

## 2020-07-20 ENCOUNTER — Encounter (HOSPITAL_COMMUNITY): Payer: Self-pay

## 2020-07-20 ENCOUNTER — Ambulatory Visit (HOSPITAL_COMMUNITY)
Admission: EM | Admit: 2020-07-20 | Discharge: 2020-07-20 | Disposition: A | Discharge status: Home / Self Care | Payer: Medicaid Other | Attending: Family Medicine | Admitting: Family Medicine

## 2020-07-20 DIAGNOSIS — Z87891 Personal history of nicotine dependence: Secondary | ICD-10-CM | POA: Insufficient documentation

## 2020-07-20 DIAGNOSIS — R509 Fever, unspecified: Secondary | ICD-10-CM | POA: Insufficient documentation

## 2020-07-20 DIAGNOSIS — N8301 Follicular cyst of right ovary: Secondary | ICD-10-CM | POA: Insufficient documentation

## 2020-07-20 DIAGNOSIS — R1031 Right lower quadrant pain: Secondary | ICD-10-CM | POA: Diagnosis present

## 2020-07-20 DIAGNOSIS — G8929 Other chronic pain: Secondary | ICD-10-CM | POA: Diagnosis not present

## 2020-07-20 DIAGNOSIS — Z20822 Contact with and (suspected) exposure to covid-19: Secondary | ICD-10-CM | POA: Insufficient documentation

## 2020-07-20 DIAGNOSIS — Z79899 Other long term (current) drug therapy: Secondary | ICD-10-CM | POA: Insufficient documentation

## 2020-07-20 LAB — COMPREHENSIVE METABOLIC PANEL
ALT: 10 U/L (ref 0–44)
AST: 17 U/L (ref 15–41)
Albumin: 4.5 g/dL (ref 3.5–5.0)
Alkaline Phosphatase: 31 U/L — ABNORMAL LOW (ref 38–126)
Anion gap: 10 (ref 5–15)
BUN: 6 mg/dL (ref 6–20)
CO2: 23 mmol/L (ref 22–32)
Calcium: 9.5 mg/dL (ref 8.9–10.3)
Chloride: 101 mmol/L (ref 98–111)
Creatinine, Ser: 0.93 mg/dL (ref 0.44–1.00)
GFR calc Af Amer: >60 mL/min (ref >60)
GFR calc non Af Amer: >60 mL/min (ref >60)
Glucose, Bld: 93 mg/dL (ref 70–99)
Potassium: 3.4 mmol/L — ABNORMAL LOW (ref 3.5–5.1)
Sodium: 134 mmol/L — ABNORMAL LOW (ref 135–145)
Total Bilirubin: 0.6 mg/dL (ref 0.3–1.2)
Total Protein: 8.2 g/dL — ABNORMAL HIGH (ref 6.5–8.1)

## 2020-07-20 LAB — URINALYSIS, ROUTINE W REFLEX MICROSCOPIC
Glucose, UA: NEGATIVE mg/dL
Hgb urine dipstick: NEGATIVE
Ketones, ur: 20 mg/dL — AB
Nitrite: NEGATIVE
Protein, ur: 30 mg/dL — AB
Specific Gravity, Urine: 1.024 (ref 1.005–1.030)
pH: 5 (ref 5.0–8.0)

## 2020-07-20 LAB — CBC
HCT: 41.4 % (ref 36.0–46.0)
Hemoglobin: 13.5 g/dL (ref 12.0–15.0)
MCH: 27.8 pg (ref 26.0–34.0)
MCHC: 32.6 g/dL (ref 30.0–36.0)
MCV: 85.4 fL (ref 80.0–100.0)
Platelets: 277 10*3/uL (ref 150–400)
RBC: 4.85 MIL/uL (ref 3.87–5.11)
RDW: 13.3 % (ref 11.5–15.5)
WBC: 9.5 10*3/uL (ref 4.0–10.5)
nRBC: 0 % (ref 0.0–0.2)

## 2020-07-20 LAB — LIPASE, BLOOD: Lipase: 27 U/L (ref 11–51)

## 2020-07-20 LAB — I-STAT BETA HCG BLOOD, ED (MC, WL, AP ONLY): I-stat hCG, quantitative: <5 m[IU]/mL (ref <5)

## 2020-07-20 NOTE — ED Triage Notes (Signed)
Patient arrived with complaints of RLQ pain over the last week, reports today she began vomiting. Declines any diarrhea or constipation.

## 2020-07-21 ENCOUNTER — Emergency Department (HOSPITAL_COMMUNITY): Payer: Medicaid Other

## 2020-07-21 ENCOUNTER — Encounter (HOSPITAL_COMMUNITY): Payer: Self-pay | Admitting: Student

## 2020-07-21 ENCOUNTER — Emergency Department (HOSPITAL_COMMUNITY)
Admission: EM | Admit: 2020-07-21 | Discharge: 2020-07-21 | Disposition: A | Discharge status: Home / Self Care | Payer: Medicaid Other | Attending: Emergency Medicine | Admitting: Emergency Medicine

## 2020-07-21 DIAGNOSIS — N83201 Unspecified ovarian cyst, right side: Secondary | ICD-10-CM

## 2020-07-21 DIAGNOSIS — R509 Fever, unspecified: Secondary | ICD-10-CM

## 2020-07-21 LAB — SARS CORONAVIRUS 2 BY RT PCR (HOSPITAL ORDER, PERFORMED IN ~~LOC~~ HOSPITAL LAB): SARS Coronavirus 2: NEGATIVE

## 2020-07-21 LAB — GC/CHLAMYDIA PROBE AMP (~~LOC~~) NOT AT ARMC
Chlamydia: POSITIVE — AB
Comment: NEGATIVE
Comment: NORMAL
Neisseria Gonorrhea: NEGATIVE

## 2020-07-21 LAB — WET PREP, GENITAL
Clue Cells Wet Prep HPF POC: NONE SEEN
Sperm: NONE SEEN
Trich, Wet Prep: NONE SEEN
Yeast Wet Prep HPF POC: NONE SEEN

## 2020-07-21 MED ORDER — POTASSIUM CHLORIDE CRYS ER 20 MEQ PO TBCR
40.0000 meq | EXTENDED_RELEASE_TABLET | Freq: Once | ORAL | Status: AC
  Administered 2020-07-21: 40 meq via ORAL
  Filled 2020-07-21: qty 2

## 2020-07-21 MED ORDER — NAPROXEN 500 MG PO TABS: 500.0000 mg | ORAL_TABLET | Freq: Two times a day (BID) | ORAL | 0 refills | Status: DC | PRN

## 2020-07-21 MED ORDER — MORPHINE SULFATE (PF) 4 MG/ML IV SOLN
4.0000 mg | Freq: Once | INTRAVENOUS | Status: AC
  Administered 2020-07-21: 4 mg via INTRAVENOUS
  Filled 2020-07-21: qty 1

## 2020-07-21 MED ORDER — ACETAMINOPHEN 325 MG PO TABS
650.0000 mg | ORAL_TABLET | Freq: Once | ORAL | Status: AC
  Administered 2020-07-21: 650 mg via ORAL
  Filled 2020-07-21: qty 2

## 2020-07-21 MED ORDER — CEPHALEXIN 500 MG PO CAPS: 500.0000 mg | ORAL_CAPSULE | Freq: Three times a day (TID) | ORAL | 0 refills | Status: DC

## 2020-07-21 MED ORDER — IOHEXOL 300 MG/ML  SOLN
100.0000 mL | Freq: Once | INTRAMUSCULAR | Status: AC | PRN
  Administered 2020-07-21: 100 mL via INTRAVENOUS

## 2020-07-21 MED ORDER — ONDANSETRON HCL 4 MG/2ML IJ SOLN
4.0000 mg | Freq: Once | INTRAMUSCULAR | Status: AC
  Administered 2020-07-21: 4 mg via INTRAVENOUS
  Filled 2020-07-21: qty 2

## 2020-07-21 MED ORDER — ONDANSETRON 4 MG PO TBDP: 4.0000 mg | ORAL_TABLET | Freq: Three times a day (TID) | ORAL | 0 refills | Status: DC | PRN

## 2020-07-21 MED ORDER — SODIUM CHLORIDE 0.9 % IV BOLUS
1000.0000 mL | Freq: Once | INTRAVENOUS | Status: AC
  Administered 2020-07-21: 1000 mL via INTRAVENOUS

## 2020-07-21 NOTE — ED Provider Notes (Addendum)
[facility] DEPT Provider Note   CSN: [id number] Arrival date & time: 07/20/20  2014     History Chief Complaint  Patient presents with  . Abdominal Pain    Phyllis Alexander is a 24 y.o. female with a history of anemia & prior C-section and D&C who presents to the emergency department with complaints of abdominal pain for the past 2 to 3 days.  Patient states the pain is located in the right lower quadrant of the abdomen, started somewhat central but is seem to localize to the right side.  Discomfort is constant, gradually worsening, sharp in nature, no alleviating or aggravating factors.  She has had some nausea with one episode of emesis associated with this.  Recently completed her menstrual cycle, not currently having any active bleeding.  Has had some chills at home as well.  She denies fever, hematemesis, diarrhea, melena, hematochezia, dysuria, urgency, frequency, or vaginal discharge.  She is currently sexually active with one partner but is not concerned for STDs.  HPI     Past Medical History:  Diagnosis Date  . Medical history non-contributory     Patient Active Problem List   Diagnosis Date Noted  . VBAC (vaginal birth after Cesarean) 03/14/2020  . Pregnancy affected by fetal growth restriction 03/09/2020  . Uterine size date discrepancy pregnancy, third trimester 02/10/2020  . Anemia, antepartum 11/23/2019  . Encounter for supervision of normal pregnancy, unspecified, unspecified trimester 11/11/2019  . Previous cesarean delivery affecting pregnancy, antepartum 11/11/2019  . History of pregnancy complication 99/83/3825    Past Surgical History:  Procedure Laterality Date  . CESAREAN SECTION    . DILATION AND CURETTAGE OF UTERUS    . WISDOM TOOTH EXTRACTION       OB History    Gravida  4   Para  3   Term  2   Preterm  0   AB  1   Living  2     SAB  0   TAB  1   Ectopic  0   Multiple  1   Live Births  4         Obstetric Comments  Previable twins delivery with PPROM        Family History  Problem Relation Age of Onset  . Diabetes Other   . ADD / ADHD Brother   . Hypertension Maternal Aunt   . Arthritis Maternal Grandmother   . Diabetes Maternal Grandmother   . Hypertension Maternal Grandmother   . Stroke Maternal Grandmother   . Arthritis Paternal Grandmother     Social History   Tobacco Use  . Smoking status: Former Smoker    Packs/day: 0.50    Types: Cigarettes    Quit date: 08/15/2018    Years since quitting: 1.9  . Smokeless tobacco: Never Used  Vaping Use  . Vaping Use: Never used  Substance Use Topics  . Alcohol use: No  . Drug use: Never    Home Medications Prior to Admission medications   Medication Sig Start Date End Date Taking? Authorizing Provider  acetaminophen (TYLENOL) 500 MG tablet Take 1 tablet (500 mg total) by mouth every 6 (six) hours as needed. 03/26/20   Fawze, Mina A, PA-C  Blood Pressure Monitoring (BLOOD PRESSURE CUFF) MISC 1 kit by Does not apply route daily. 11/11/19   Lavonia Drafts, MD  Elastic Bandages & Supports (COMFORT FIT MATERNITY SUPP MED) MISC 1 Units by Does not apply route daily. Patient not taking:  Reported on 03/31/2020 02/10/20   Luvenia Redden, PA-C  ferrous sulfate (FERROUSUL) 325 (65 FE) MG tablet Take 1 tablet (325 mg total) by mouth 2 (two) times daily. 02/10/20   Luvenia Redden, PA-C  ibuprofen (ADVIL) 600 MG tablet Take 1 tablet (600 mg total) by mouth every 6 (six) hours as needed. 03/26/20   Fawze, Mina A, PA-C  ondansetron (ZOFRAN) 4 MG tablet Take 1 tablet (4 mg total) by mouth every 8 (eight) hours as needed for nausea or vomiting. 03/26/20   Nils Flack, Mina A, PA-C  polyethylene glycol powder (GLYCOLAX/MIRALAX) 17 GM/SCOOP powder Take 17 g by mouth daily as needed. 03/16/20   Clarnce Flock, MD  pseudoephedrine (SUDAFED) 30 MG tablet Take 2 tablets (60 mg total) by mouth every 4 (four) hours as needed for congestion  (breast engorgement). 03/17/20   Seabron Spates, CNM    Allergies    Patient has no known allergies.  Review of Systems   Review of Systems  Constitutional: Positive for chills. Negative for fever.  HENT: Negative for congestion, ear pain and sore throat.   Respiratory: Negative for cough and shortness of breath.   Cardiovascular: Negative for chest pain.  Gastrointestinal: Positive for abdominal pain, nausea and vomiting. Negative for blood in stool, constipation and diarrhea.  Genitourinary: Negative for dysuria, frequency, urgency, vaginal bleeding and vaginal discharge.  Neurological: Negative for syncope.  All other systems reviewed and are negative.   Physical Exam Updated Vital Signs BP 117/71 (BP Location: Left Arm)   Pulse 94   Temp (!) 100.6 F (38.1 C) (Oral)   Resp 18   SpO2 98%   Physical Exam Vitals and nursing note reviewed. Exam conducted with a chaperone present.  Constitutional:      General: She is not in acute distress.    Appearance: She is well-developed. She is not toxic-appearing.  HENT:     Head: Normocephalic and atraumatic.  Eyes:     General:        Right eye: No discharge.        Left eye: No discharge.     Conjunctiva/sclera: Conjunctivae normal.  Cardiovascular:     Rate and Rhythm: Normal rate and regular rhythm.  Pulmonary:     Effort: Pulmonary effort is normal. No respiratory distress.     Breath sounds: Normal breath sounds. No wheezing, rhonchi or rales.  Abdominal:     General: There is no distension.     Palpations: Abdomen is soft.     Tenderness: There is abdominal tenderness in the right lower quadrant and suprapubic area. There is no guarding or rebound.  Genitourinary:    Labia:        Right: No lesion.        Left: No lesion.      Vagina: Vaginal discharge (thin yellow) present. No bleeding.     Cervix: No cervical motion tenderness.     Adnexa:        Right: No mass or tenderness.         Left: No mass or  tenderness.    Musculoskeletal:     Cervical back: Neck supple.  Skin:    General: Skin is warm and dry.     Findings: No rash.  Neurological:     Mental Status: She is alert.     Comments: Clear speech.   Psychiatric:        Behavior: Behavior normal.     ED Results /  Procedures / Treatments   Labs (all labs ordered are listed, but only abnormal results are displayed) Labs Reviewed  COMPREHENSIVE METABOLIC PANEL - Abnormal; Notable for the following components:      Result Value   Sodium 134 (*)    Potassium 3.4 (*)    Total Protein 8.2 (*)    Alkaline Phosphatase 31 (*)    All other components within normal limits  URINALYSIS, ROUTINE W REFLEX MICROSCOPIC - Abnormal; Notable for the following components:   Bilirubin Urine SMALL (*)    Ketones, ur 20 (*)    Protein, ur 30 (*)    Leukocytes,Ua SMALL (*)    Bacteria, UA RARE (*)    All other components within normal limits  LIPASE, BLOOD  CBC  I-STAT BETA HCG BLOOD, ED ([facility], [facility], [facility] ONLY)    EKG None  Radiology US Transvaginal Non-OB  Result Date: 07/21/2020 CLINICAL DATA:  Right lower quadrant pain for 1 week.  Abnormal CT EXAM: TRANSABDOMINAL AND TRANSVAGINAL ULTRASOUND OF PELVIS DOPPLER ULTRASOUND OF OVARIES TECHNIQUE: Both transabdominal and transvaginal ultrasound examinations of the pelvis were performed. Transabdominal technique was performed for global imaging of the pelvis including uterus, ovaries, adnexal regions, and pelvic cul-de-sac. It was necessary to proceed with endovaginal exam following the transabdominal exam to visualize the endometrium and right ovary. Color and duplex Doppler ultrasound was utilized to evaluate blood flow to the ovaries. COMPARISON:  CT from earlier today FINDINGS: Uterus Measurements: 7 x 4 x 5 cm = volume: 80 mL. No fibroids or other mass visualized. Endometrium Thickness: 5 mm.  No focal abnormality visualized. Right ovary Measurements: 78 x 40 x 63 mm = volume: 104 mL. 4.5 cm simple  cysts, likely follicular. Left ovary Measurements: 46 x 24 x 34 mm = volume: 20 mL. Normal appearance/no adnexal mass. Pulsed Doppler evaluation of both ovaries demonstrates normal low-resistance arterial and venous waveforms. Other findings Small volume pelvic fluid. A few particles are seen within the urinary bladder. IMPRESSION: 1. 4.5 cm simple right ovarian cyst. 2. Normal bilateral ovarian blood flow. 3. Particulate material in the bladder, please correlate with urinalysis. Electronically Signed   By: Monte Fantasia M.D.   On: 07/21/2020 04:37   US Pelvis Complete  Result Date: 07/21/2020 CLINICAL DATA:  Right lower quadrant pain for 1 week.  Abnormal CT EXAM: TRANSABDOMINAL AND TRANSVAGINAL ULTRASOUND OF PELVIS DOPPLER ULTRASOUND OF OVARIES TECHNIQUE: Both transabdominal and transvaginal ultrasound examinations of the pelvis were performed. Transabdominal technique was performed for global imaging of the pelvis including uterus, ovaries, adnexal regions, and pelvic cul-de-sac. It was necessary to proceed with endovaginal exam following the transabdominal exam to visualize the endometrium and right ovary. Color and duplex Doppler ultrasound was utilized to evaluate blood flow to the ovaries. COMPARISON:  CT from earlier today FINDINGS: Uterus Measurements: 7 x 4 x 5 cm = volume: 80 mL. No fibroids or other mass visualized. Endometrium Thickness: 5 mm.  No focal abnormality visualized. Right ovary Measurements: 78 x 40 x 63 mm = volume: 104 mL. 4.5 cm simple cysts, likely follicular. Left ovary Measurements: 46 x 24 x 34 mm = volume: 20 mL. Normal appearance/no adnexal mass. Pulsed Doppler evaluation of both ovaries demonstrates normal low-resistance arterial and venous waveforms. Other findings Small volume pelvic fluid. A few particles are seen within the urinary bladder. IMPRESSION: 1. 4.5 cm simple right ovarian cyst. 2. Normal bilateral ovarian blood flow. 3. Particulate material in the bladder, please  correlate with urinalysis. Electronically Signed  By: Monte Fantasia M.D.   On: 07/21/2020 04:37   CT Abdomen Pelvis W Contrast  Result Date: 07/21/2020 CLINICAL DATA:  Right lower quadrant abdominal pain with suspicion for appendicitis. Pain for the last week. Vomiting today. EXAM: CT ABDOMEN AND PELVIS WITH CONTRAST TECHNIQUE: Multidetector CT imaging of the abdomen and pelvis was performed using the standard protocol following bolus administration of intravenous contrast. CONTRAST:  155m OMNIPAQUE IOHEXOL 300 MG/ML  SOLN COMPARISON:  None. FINDINGS: Lower chest: The lung bases are clear. Hepatobiliary: No focal liver abnormality is seen. No gallstones, gallbladder wall thickening, or biliary dilatation. Pancreas: Unremarkable. No pancreatic ductal dilatation or surrounding inflammatory changes. Spleen: Normal in size without focal abnormality. Adrenals/Urinary Tract: Adrenal glands are unremarkable. Kidneys are normal, without renal calculi, focal lesion, or hydronephrosis. Bladder is unremarkable. Stomach/Bowel: The stomach, small bowel, and colon are not abnormally distended. No wall thickening is appreciated. The appendix is poorly demonstrated but appears normal. Vascular/Lymphatic: No significant vascular findings are present. No enlarged abdominal or pelvic lymph nodes. Reproductive: The uterus is not enlarged. Bilateral adnexal cysts. Right-sided cyst measures about 4.8 cm diameter and the left about 1.8 cm diameter. Small amount of free fluid in the pelvis is likely physiologic. Infiltration or edema in the anterior pelvic fat. Consider pelvic ultrasound to exclude pelvic inflammatory process. Other: No free air in the abdomen. Abdominal wall musculature appears intact. Musculoskeletal: No acute or significant osseous findings. IMPRESSION: 1. No evidence of bowel obstruction or inflammation. Appendix is poorly demonstrated but appears normal. 2. Bilateral adnexal cysts. Small amount of free fluid in  the pelvis is likely physiologic. Infiltration or edema in the anterior pelvic fat. Consider pelvic ultrasound to exclude pelvic inflammatory process. Electronically Signed   By: WLucienne CapersM.D.   On: 07/21/2020 02:42   [place]/Ven Flow Abd Pelv Doppler  Result Date: 07/21/2020 CLINICAL DATA:  Right lower quadrant pain for 1 week.  Abnormal CT EXAM: TRANSABDOMINAL AND TRANSVAGINAL ULTRASOUND OF PELVIS DOPPLER ULTRASOUND OF OVARIES TECHNIQUE: Both transabdominal and transvaginal ultrasound examinations of the pelvis were performed. Transabdominal technique was performed for global imaging of the pelvis including uterus, ovaries, adnexal regions, and pelvic cul-de-sac. It was necessary to proceed with endovaginal exam following the transabdominal exam to visualize the endometrium and right ovary. Color and duplex Doppler ultrasound was utilized to evaluate blood flow to the ovaries. COMPARISON:  CT from earlier today FINDINGS: Uterus Measurements: 7 x 4 x 5 cm = volume: 80 mL. No fibroids or other mass visualized. Endometrium Thickness: 5 mm.  No focal abnormality visualized. Right ovary Measurements: 78 x 40 x 63 mm = volume: 104 mL. 4.5 cm simple cysts, likely follicular. Left ovary Measurements: 46 x 24 x 34 mm = volume: 20 mL. Normal appearance/no adnexal mass. Pulsed Doppler evaluation of both ovaries demonstrates normal low-resistance arterial and venous waveforms. Other findings Small volume pelvic fluid. A few particles are seen within the urinary bladder. IMPRESSION: 1. 4.5 cm simple right ovarian cyst. 2. Normal bilateral ovarian blood flow. 3. Particulate material in the bladder, please correlate with urinalysis. Electronically Signed   By: JMonte FantasiaM.D.   On: 07/21/2020 04:37   DG Chest Portable 1 View  Result Date: 07/21/2020 CLINICAL DATA:  Fever EXAM: PORTABLE CHEST 1 VIEW COMPARISON:  12/12/2018 FINDINGS: Heart and mediastinal contours are within normal limits. No focal opacities or  effusions. No acute bony abnormality. IMPRESSION: No active disease. Electronically Signed   By: KRolm BaptiseM.D.  On: 07/21/2020 03:43    Procedures Procedures (including critical care time)  Medications Ordered in ED Medications  morphine 4 MG/ML injection 4 mg (4 mg Intravenous Given 07/21/20 0205)  sodium chloride 0.9 % bolus 1,000 mL (0 mLs Intravenous Stopped 07/21/20 0419)  ondansetron (ZOFRAN) injection 4 mg (4 mg Intravenous Given 07/21/20 0206)  iohexol (OMNIPAQUE) 300 MG/ML solution 100 mL (100 mLs Intravenous Contrast Given 07/21/20 0222)    ED Course  I have reviewed the triage vital signs and the nursing notes.  Pertinent labs & imaging results that were available during my care of the patient were reviewed by me and considered in my medical decision making (see chart for details).    MDM Rules/Calculators/A&P                          Patient presents to the ED with complaints of right lower quadrant abdominal pain.  Patient is nontoxic, she is noted to be febrile on arrival, vitals are otherwise reassuring.  On exam she has right lower quadrant and suprapubic abdominal tenderness to palpation.  A pelvic exam was performed with minimal yellow discharge present, no significant cervical motion or adnexal tenderness.  Ddx: Appendicitis, UTI/pyelonephritis, PID, ectopic pregnancy, tubo-ovarian abscess.   Additional history obtained:  Additional history obtained from chart review nursing note reviewed. Lab Tests:  I reviewed and interpreted labs, which included:  CBC: No significant leukocytosis or anemia. CMP: Mild electrolyte abnormalities as above, no significant derangement. Lipase: Within normal limits Urinalysis: Somewhat concerning for infection with some leukocytes and 21-50 WBCs, however nitrite negative and only rare bacteria. Wet prep: Many WBCs, no trichomoniasis, yeast, or BV.  Imaging Studies ordered:  I ordered imaging studies which included CT abdomen/pelvis  with contrast, I independently visualized and interpreted imaging which showed : 1. No evidence of bowel obstruction or inflammation. Appendix is poorly demonstrated but appears normal. 2. Bilateral adnexal cysts. Small amount of free fluid in the pelvis is likely physiologic. Infiltration or edema in the anterior pelvic fat. Consider pelvic ultrasound to exclude pelvic inflammatory process.   --> No findings of appendicitis.  Plan for pelvic ultrasound for further evaluation.  Given her fever we also will obtain Covid test and chest x-ray.  Chest x-ray negative for pneumonia or other acute process. Pelvic ultrasound: 1. 4.5 cm simple right ovarian cyst. 2. Normal bilateral ovarian blood flow. 3. Particulate material in the bladder, please correlate with urinalysis.  4.5 cm simple right ovarian cyst likely underlying etiology of patient's right lower quadrant abdominal pain.  In terms of bladder findings on ultrasound she does have some leukocytes and WBCs in her urine, given she is febrile without alternative source feel it would be prudent to cover her for urinary tract infection at this time.  Culture of urine is pending.  She did not have a large amount of discharge or any focal adnexal or cervical motion tenderness on her pelvic exam, re-discussed and she remains without concern for STI, therefore feel that PID is somewhat less likely, GC/chlamydia pending.  Her Covid test is pending as well.  She is feeling better, tolerating p.o., and she would really like to go home.  Will discharge with NSAID and Keflex with OB/GYN follow-up. I discussed results, treatment plan, need for follow-up, and return precautions with the patient. Provided opportunity for questions, patient confirmed understanding and is in agreement with plan.   Findings and plan of care discussed with supervising physician Dr.  Long who is in agreement.   Portions of this note were generated with [employer]. Dictation  errors may occur despite best attempts at proofreading.  Final Clinical Impression(s) / ED Diagnoses Final diagnoses:  Cyst of right ovary  Fever, unspecified fever cause    Rx / DC Orders ED Discharge Orders         Ordered    naproxen (NAPROSYN) 500 MG tablet  2 times daily PRN        07/21/20 0508    cephALEXin (KEFLEX) 500 MG capsule  3 times daily        07/21/20 0508    ondansetron (ZOFRAN ODT) 4 MG disintegrating tablet  Every 8 hours PRN        07/21/20 0508           Amaryllis Dyke, PA-C 07/21/20 4997  Phyllis Alexander was evaluated in Emergency Department on 07/21/2020 for the symptoms described in the history of present illness. He/she was evaluated in the context of the global COVID-19 pandemic, which necessitated consideration that the patient might be at risk for infection with the SARS-CoV-2 virus that causes COVID-19. Institutional protocols and algorithms that pertain to the evaluation of patients at risk for COVID-19 are in a state of rapid change based on information released by regulatory bodies including the CDC and federal and state organizations. These policies and algorithms were followed during the patient's care in the ED.     Leafy Kindle 07/21/20 1820    Margette Fast, MD 07/24/20 1850

## 2020-07-21 NOTE — ED Notes (Signed)
Patient transported to CT 

## 2020-07-21 NOTE — ED Notes (Signed)
Called lab and spoke with Jon Gills who will run urine culture.

## 2020-07-21 NOTE — Discharge Instructions (Signed)
You were seen in the emergency department today for abdominal pain.  Your labs were overall reassuring.  Your CT scan did not show a significant problem with your appendix.  Your ultrasound did show that you have a ovarian cyst which we suspect is causing your pain.  Please see attached handout for further details.  In terms of your fever, your urine did have some findings suspicious for infection, therefore we are starting you on Keflex, an antibiotic, to treat this.  We are also sending you with naproxen to help with pain and zofran to help with  nausea.   - Naproxen is a nonsteroidal anti-inflammatory medication that will help with pain and swelling. Be sure to take this medication as prescribed with food, 1 pill every 12 hours,  It should be taken with food, as it can cause stomach upset, and more seriously, stomach bleeding. Do not take other nonsteroidal anti-inflammatory medications with this such as Advil, Motrin, Aleve, Mobic, Goodie Powder, or Motrin.    -Zofran is an antinausea medication you may take every 8 hours as needed for nausea and vomiting.  You make take Tylenol per over the counter dosing with these medications.   We have prescribed you new medication(s) today. Discuss the medications prescribed today with your pharmacist as they can have adverse effects and interactions with your other medicines including over the counter and prescribed medications. Seek medical evaluation if you start to experience new or abnormal symptoms after taking one of these medicines, seek care immediately if you start to experience difficulty breathing, feeling of your throat closing, facial swelling, or rash as these could be indications of a more serious allergic reaction  We tested you for gonorrhea and chlamydia, we will call you if these results are positive, if positive you will need additional antibiotics and to inform all sexual partners.  We have also tested you for Covid and will call you if  these results are positive.  We would like you to follow-up with OB/GYN within 3 to 5 days.  Return to the ER for new or worsening symptoms including but not limited to worsening fevers, worsening pain, new or different pain, inability to keep fluids down, or any other concerns.

## 2020-07-22 LAB — URINE CULTURE

## 2020-07-23 ENCOUNTER — Other Ambulatory Visit: Payer: Self-pay

## 2020-07-23 MED ORDER — AZITHROMYCIN 250 MG PO TABS: 1000.0000 mg | ORAL_TABLET | Freq: Once | ORAL | 0 refills | Status: AC

## 2020-07-25 NOTE — ED Provider Notes (Signed)
[facility] CARE CENTER   [id number] 07/20/20 Arrival Time: 1747  ASSESSMENT & PLAN:  1. Abdominal pain, chronic, right lower quadrant     ED evaluation recommended. Agrees. Stable upon discharge.    Follow-up Information    Go to  [facility] EMERGENCY DEPARTMENT.   Specialty: Emergency Medicine Contact information: [address] [id number] Wilhemina Bonito [place] [address] [phone]               Reviewed expectations re: course of current medical issues. Questions answered. Outlined signs and symptoms indicating need for more acute intervention. Patient verbalized understanding. After Visit Summary given.   SUBJECTIVE: History from: patient. Phyllis Alexander is a 24 y.o. female who presents with complaint of abdominal pain; 2-3 days; worsening. Mainly lower right; sharp. Mild nausea; one emesis. No diarrhea. No fever reported.  No LMP recorded.   Past Surgical History:  Procedure Laterality Date   CESAREAN SECTION     DILATION AND CURETTAGE OF UTERUS     WISDOM TOOTH EXTRACTION       OBJECTIVE:  Vitals:   07/20/20 1915  BP: 111/64  Pulse: 88  Resp: 18  Temp: (!) 100.4 F (38 C)  TempSrc: Oral  SpO2: 100%    General appearance: appears to be in pain HEENT: [place]; AT; oropharynx moist  Lungs: unlabored respirations Abdomen: soft; without distention; moderate RLQ TTP; without guarding or rebound tenderness Back: without reported CVA tenderness; FROM at waist Extremities: without LE edema; symmetrical; without gross deformities Skin: warm and dry Neurologic: normal gait Psychological: alert and cooperative; normal mood and affect   No Known Allergies                                             Past Medical History:  Diagnosis Date   Medical history non-contributory     Social History   Socioeconomic History   Marital status: Single    Spouse name: Not on file   Number of children: Not on file   Years of  education: Not on file   Highest education level: Not on file  Occupational History   Not on file  Tobacco Use   Smoking status: Former Smoker    Packs/day: 0.50    Types: Cigarettes    Quit date: 08/15/2018    Years since quitting: 1.9   Smokeless tobacco: Never Used  Vaping Use   Vaping Use: Never used  Substance and Sexual Activity   Alcohol use: No   Drug use: Never   Sexual activity: Yes  Other Topics Concern   Not on file  Social History Narrative   Not on file   Social Determinants of Health   Financial Resource Strain:    Difficulty of Paying Living Expenses: Not on file  Food Insecurity:    Worried About Radiation protection practitioner of Food in the Last Year: Not on file   [employer] of Food in the Last Year: Not on file  Transportation Needs:    Lack of Transportation (Medical): Not on file   Lack of Transportation (Non-Medical): Not on file  Physical Activity:    Days of Exercise per Week: Not on file   Minutes of Exercise per Session: Not on file  Stress:    Feeling of Stress : Not on file  Social Connections:    Frequency of Communication with Friends and Family:  Not on file   Frequency of Social Gatherings with Friends and Family: Not on file   Attends Religious Services: Not on file   Active Member of Clubs or Organizations: Not on file   Attends Banker Meetings: Not on file   Marital Status: Not on file  Intimate Partner Violence:    Fear of Current or Ex-Partner: Not on file   Emotionally Abused: Not on file   Physically Abused: Not on file   Sexually Abused: Not on file    Family History  Problem Relation Age of Onset   Diabetes Other    ADD / ADHD Brother    Hypertension Maternal Aunt    Arthritis Maternal Grandmother    Diabetes Maternal Grandmother    Hypertension Maternal Grandmother    Stroke Maternal Grandmother    Arthritis Paternal Dulce Sellar, MD 07/25/20 1008

## 2020-07-28 ENCOUNTER — Other Ambulatory Visit: Payer: Self-pay

## 2020-07-28 ENCOUNTER — Ambulatory Visit: Payer: Medicaid Other | Admitting: Obstetrics and Gynecology

## 2020-08-02 ENCOUNTER — Other Ambulatory Visit: Payer: Self-pay

## 2020-08-02 ENCOUNTER — Encounter: Payer: Self-pay | Admitting: Obstetrics and Gynecology

## 2020-08-02 ENCOUNTER — Ambulatory Visit (INDEPENDENT_AMBULATORY_CARE_PROVIDER_SITE_OTHER): Payer: Medicaid Other | Admitting: Obstetrics and Gynecology

## 2020-08-02 VITALS — BP 105/70 | HR 91 | Wt 140.7 lb

## 2020-08-02 DIAGNOSIS — N83209 Unspecified ovarian cyst, unspecified side: Secondary | ICD-10-CM | POA: Insufficient documentation

## 2020-08-02 DIAGNOSIS — Z3042 Encounter for surveillance of injectable contraceptive: Secondary | ICD-10-CM | POA: Diagnosis not present

## 2020-08-02 DIAGNOSIS — A749 Chlamydial infection, unspecified: Secondary | ICD-10-CM | POA: Diagnosis not present

## 2020-08-02 DIAGNOSIS — N83201 Unspecified ovarian cyst, right side: Secondary | ICD-10-CM

## 2020-08-02 LAB — POCT URINE PREGNANCY: Preg Test, Ur: NEGATIVE

## 2020-08-02 NOTE — Progress Notes (Signed)
Patient ID: Phyllis Alexander, female   DOB: [birth year], 24 y.o.   MRN: [id number] [name] presents for ER f/u from pelvic pain. Seen 07/21/20. W/u revealed 4.5 cm simple right ovarian cyst and + chlamydia Pt was treated for chlamydia, reports has completed. Partner treated as well Pt desires to restart Depo Provera Last Depo injection 03/16/20. Has monthly cycles while on depo. Denies any bowel or bladder dysfunction  PE AF VSS Lungs clear Heart RRR Abd soft + BS GU deferred  A/P Right ovarian cyst, simple        Chlamydia         Contraception management  Ovarian cysts reviewed with pt. Will check GYN U/S next month. UPT negative. Pt instructed to reframe from IC for the next 2 weeks. F/U in 2 weeks for UPT, depo if negative and TOC for chlamydia.

## 2020-08-02 NOTE — Patient Instructions (Signed)
Ovarian Cyst An ovarian cyst is a fluid-filled sac on an ovary. The ovaries are organs that make eggs in women. Most ovarian cysts go away on their own and are not cancerous (are benign). Some cysts need treatment. Follow these instructions at home:  Take over-the-counter and prescription medicines only as told by your doctor.  Do not drive or use heavy machinery while taking prescription pain medicine.  Get pelvic exams and Pap tests as often as told by your doctor.  Return to your normal activities as told by your doctor. Ask your doctor what activities are safe for you.  Do not use any products that contain nicotine or tobacco, such as cigarettes and e-cigarettes. If you need help quitting, ask your doctor.  Keep all follow-up visits as told by your doctor. This is important. Contact a doctor if:  Your periods are: ? Late. ? Irregular. ? Painful.   Your periods stop.  You have pelvic pain that does not go away.  You have pressure on your bladder.  You have trouble making your bladder empty when you pee (urinate).  You have pain during sex.  You have any of the following in your belly (abdomen): ? A feeling of fullness. ? Pressure. ? Discomfort. ? Pain that does not go away. ? Swelling.  You feel sick most of the time.  You have trouble pooping (have constipation).  You are not as hungry as usual (you lose your appetite).  You get very bad acne.  You start to have more hair on your body and face.  You are gaining weight or losing weight without changing your exercise and eating habits.  You think you may be pregnant. Get help right away if:  You have belly pain that is very bad or gets worse.  You cannot eat or drink without throwing up (vomiting).  You suddenly get a fever.  Your period is a lot heavier than usual. This information is not intended to replace advice given to you by your health care provider. Make sure you discuss any questions you have  with your health care provider. Document Revised: 10/11/2017 Document Reviewed: 04/01/2016 Elsevier Patient Education  2020 Elsevier Inc.  

## 2020-08-02 NOTE — Progress Notes (Signed)
Pt presents f/u after ER visit for RLQ pain.  Recent u/s 07/21/2020 shows 4.5 R ovarian cyst Pt also requests to restart Depo  1st UPT neg

## 2020-08-16 ENCOUNTER — Ambulatory Visit: Payer: Medicaid Other

## 2020-08-18 ENCOUNTER — Ambulatory Visit: Payer: Medicaid Other

## 2020-08-23 ENCOUNTER — Other Ambulatory Visit: Payer: Self-pay

## 2020-08-23 ENCOUNTER — Other Ambulatory Visit (HOSPITAL_COMMUNITY)
Admission: RE | Admit: 2020-08-23 | Discharge: 2020-08-23 | Disposition: A | Discharge status: Home / Self Care | Payer: Medicaid Other | Source: Ambulatory Visit | Attending: Obstetrics and Gynecology | Admitting: Obstetrics and Gynecology

## 2020-08-23 ENCOUNTER — Ambulatory Visit (INDEPENDENT_AMBULATORY_CARE_PROVIDER_SITE_OTHER): Payer: BC Managed Care – PPO

## 2020-08-23 VITALS — BP 106/66 | HR 82 | Wt 140.0 lb

## 2020-08-23 DIAGNOSIS — Z3042 Encounter for surveillance of injectable contraceptive: Secondary | ICD-10-CM | POA: Diagnosis not present

## 2020-08-23 DIAGNOSIS — Z01812 Encounter for preprocedural laboratory examination: Secondary | ICD-10-CM | POA: Diagnosis not present

## 2020-08-23 DIAGNOSIS — A749 Chlamydial infection, unspecified: Secondary | ICD-10-CM

## 2020-08-23 LAB — POCT URINE PREGNANCY: Preg Test, Ur: NEGATIVE

## 2020-08-23 MED ORDER — MEDROXYPROGESTERONE ACETATE 150 MG/ML IM SUSP: 150.0000 mg | INTRAMUSCULAR | 3 refills | Status: DC

## 2020-08-23 MED ORDER — MEDROXYPROGESTERONE ACETATE 150 MG/ML IM SUSP
150.0000 mg | Freq: Once | INTRAMUSCULAR | Status: AC
  Administered 2020-08-23: 150 mg via INTRAMUSCULAR

## 2020-08-23 NOTE — Progress Notes (Signed)
SUBJECTIVE 24 y.o GYN presents for DEPO restart and TOC for +Chlamydia.    2nd UPT today is NEGATIVE.  Office supply DEPO given in RD, tolerated well.  Next DEPO Dec. 28/2021 - Jan. 09/2021  Administrations This Visit    medroxyPROGESTERone (DEPO-PROVERA) injection 150 mg    Admin Date 08/23/2020 Action Given Dose 150 mg Route Intramuscular Administered By Maretta Bees, RMA         Last PAP 09/09/2018  PLAN CH self swab done and sent to lab. Will call or treat per results. Return in 12 weeks for DEPO Injection Rx Depo sent to Pharmacy.

## 2020-08-23 NOTE — Progress Notes (Signed)
Patient was assessed and managed by nursing staff during this encounter. I have reviewed the chart and agree with the documentation and plan. I have also made any necessary editorial changes.  Marylen Ponto, NP 08/23/2020 2:14 PM

## 2020-08-25 LAB — CERVICOVAGINAL ANCILLARY ONLY
Chlamydia: NEGATIVE
Comment: NEGATIVE
Comment: NORMAL
Neisseria Gonorrhea: NEGATIVE

## 2020-09-01 ENCOUNTER — Ambulatory Visit
Admission: RE | Admit: 2020-09-01 | Discharge: 2020-09-01 | Disposition: A | Discharge status: Home / Self Care | Payer: BC Managed Care – PPO | Source: Ambulatory Visit | Attending: Obstetrics and Gynecology | Admitting: Obstetrics and Gynecology

## 2020-09-01 ENCOUNTER — Other Ambulatory Visit: Payer: Self-pay

## 2020-09-01 ENCOUNTER — Other Ambulatory Visit: Payer: Self-pay | Admitting: Obstetrics and Gynecology

## 2020-09-01 DIAGNOSIS — N83201 Unspecified ovarian cyst, right side: Secondary | ICD-10-CM | POA: Insufficient documentation

## 2020-09-03 IMAGING — US US OB COMP LESS 14 WK
1 series · 14 of 28 positions shown · non-contrast
Comparison: None.

CLINICAL DATA: Pregnant patient with abdominal pain.

EXAM:
TWIN OBSTETRICAL ULTRASOUND <14 WKS

[Series 1: us ob comp less 14 wk · 0.25mm/px · 66 acquisitions, 14 frames shown]
[im 3/66]
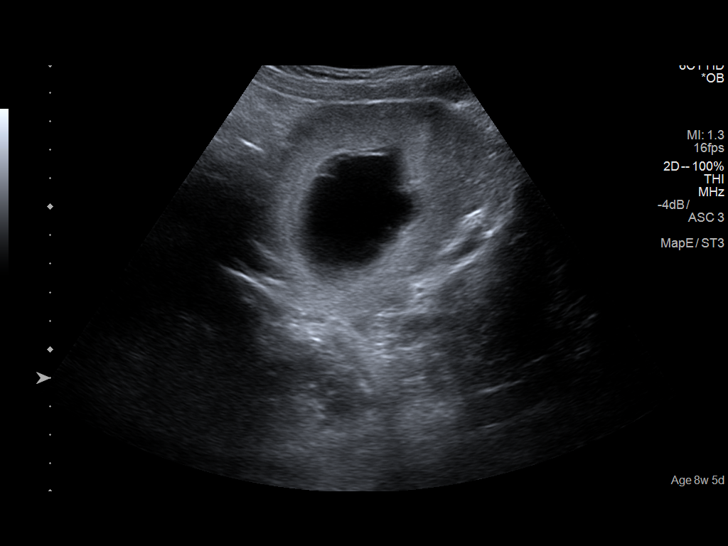
[im 8/66]
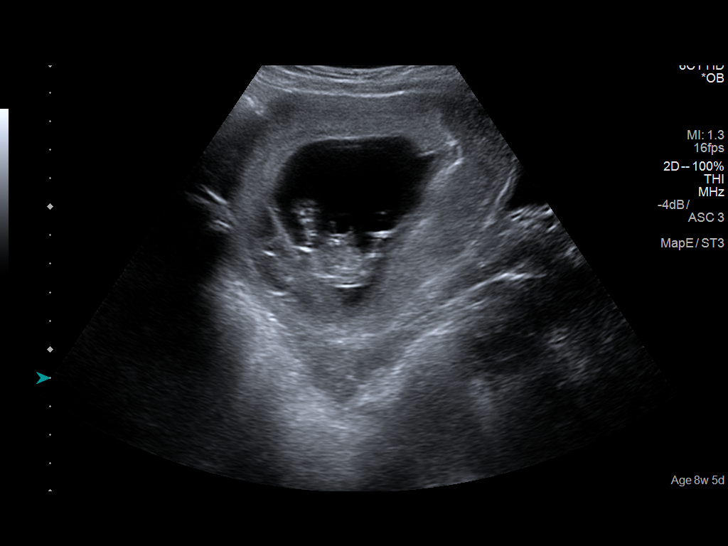
[im 13/66]
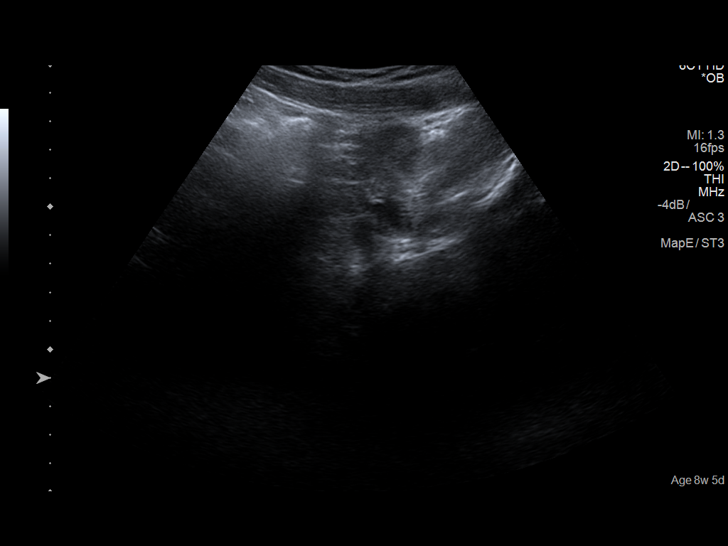
[im 17/66]
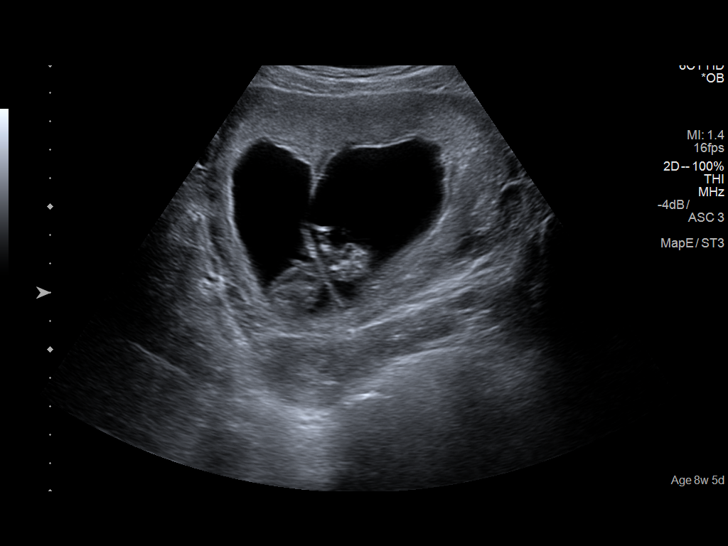
[im 22/66]
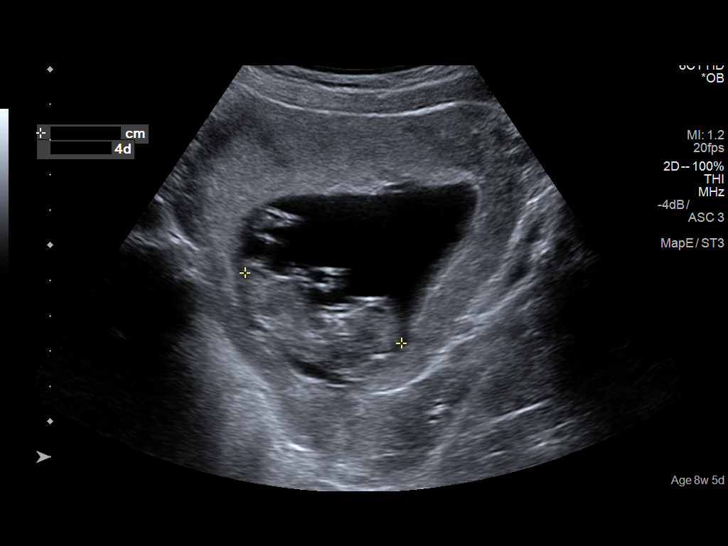
[im 27/66]
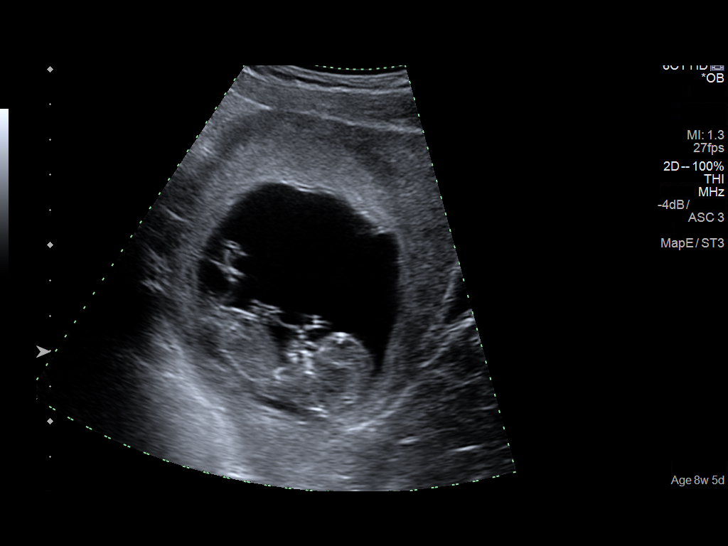
[im 32/66]
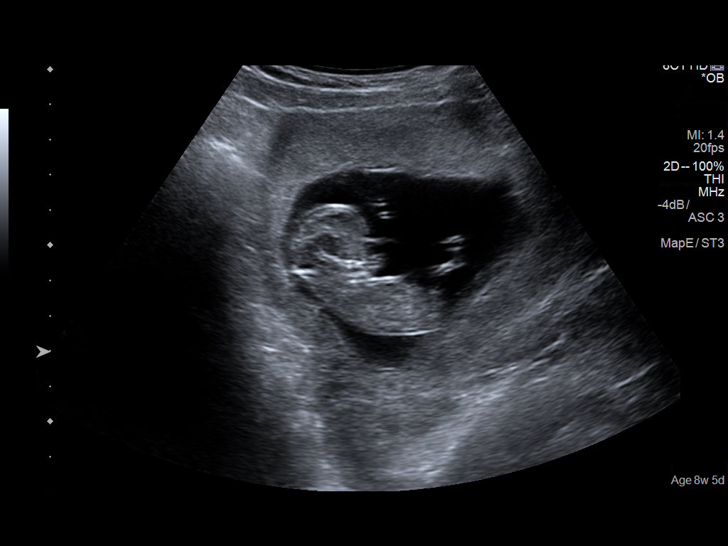
[im 37/66]
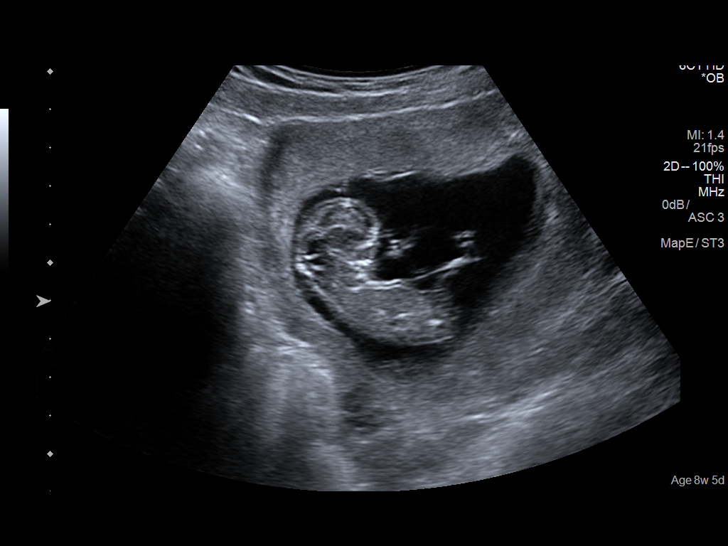
[im 41/66]
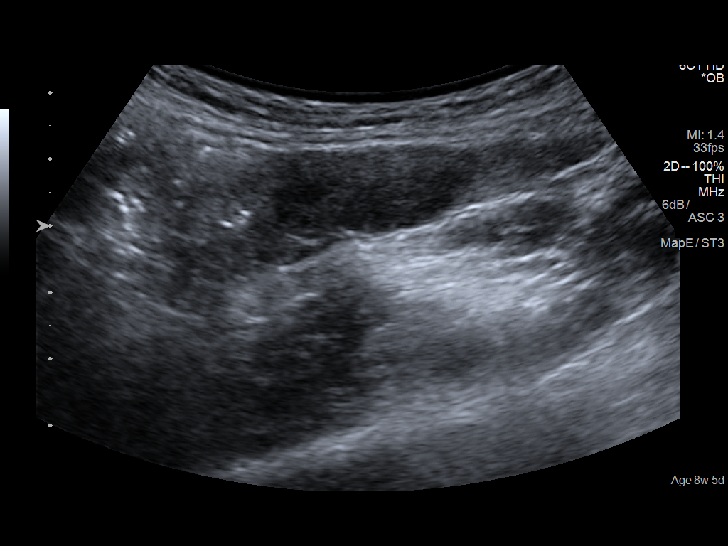
[im 46/66]
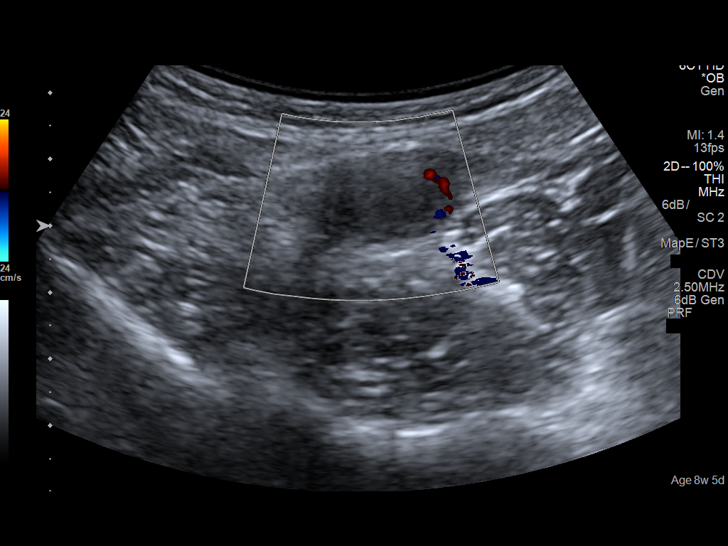
[im 51/66]
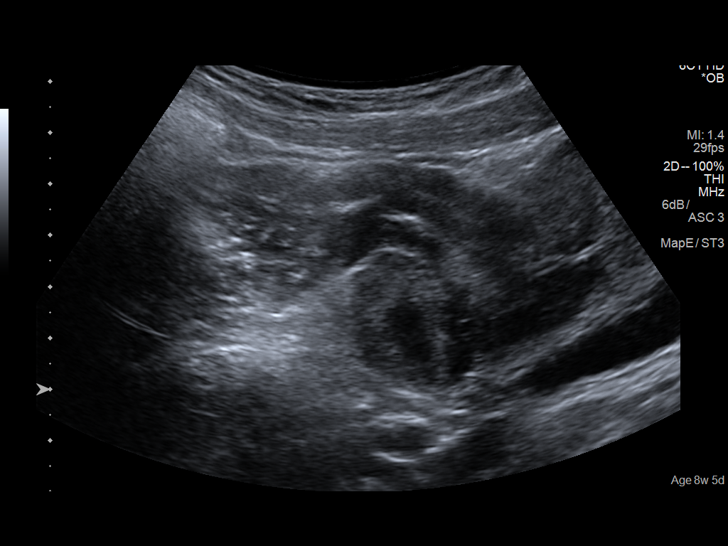
[im 56/66]
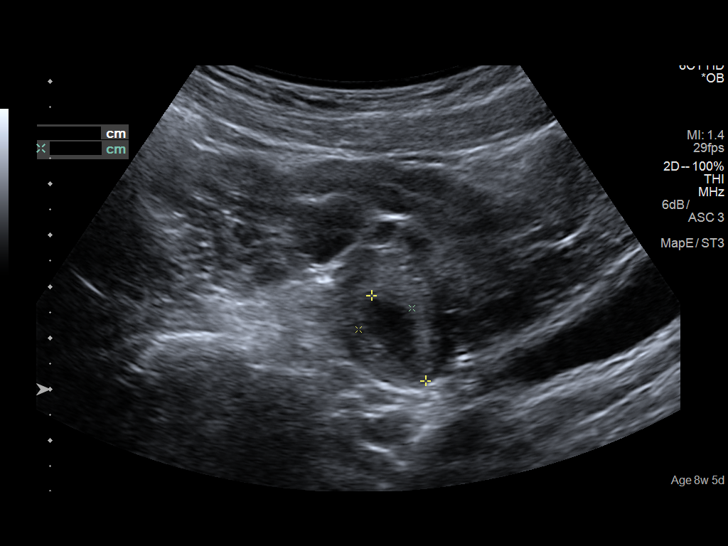
[im 61/66]
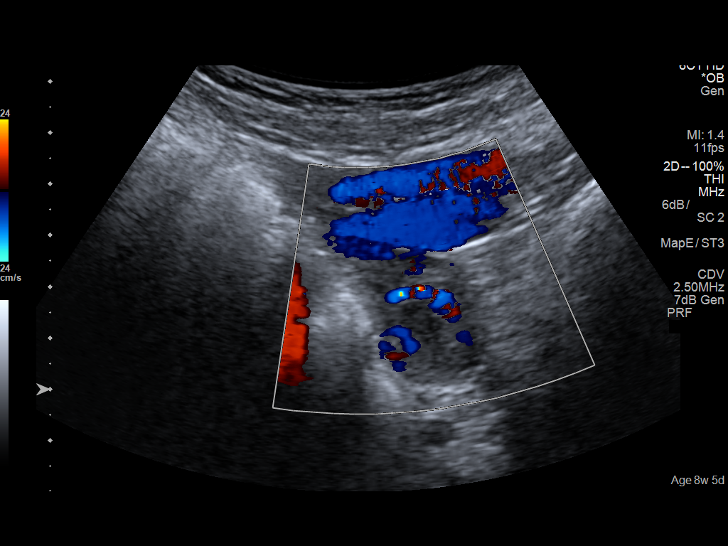
[im 66/66]
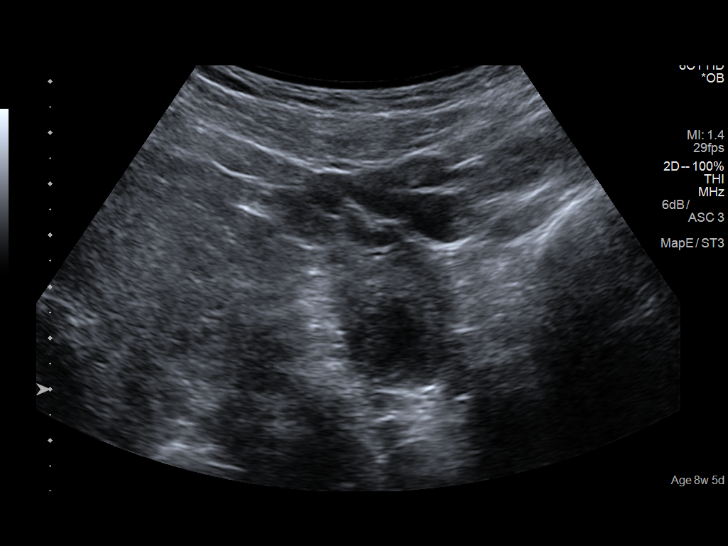

[14 of 28 positions shown; findings below may reference images not displayed]

FINDINGS: Number of IUPs:  2

Chorionicity/Amnionicity:  Dichorionic-diamniotic (thick membrane)

TWIN 1

Yolk sac:  Not Visualized.

Embryo:  Visualized.

Cardiac Activity: Visualized.

Heart Rate: 163 bpm

CRL:   54.2 mm   12 w 0 d                  US EDC: 03/07/2019

TWIN 2

Yolk sac:  Not Visualized.

Embryo:  Visualized.

Cardiac Activity: Visualized.

Heart Rate: 169 bpm

CRL:   53.0 mm   12 w 0 d                  US EDC: 03/07/2019

Subchorionic hemorrhage:  None visualized.

Maternal uterus/adnexae: Normal right and left ovaries. Probable
corpus luteum left ovary. No free fluid in the pelvis.
IMPRESSION: Twin intrauterine gestation.  No subchorionic hemorrhage.

## 2020-09-05 ENCOUNTER — Other Ambulatory Visit: Payer: Self-pay | Admitting: *Deleted

## 2020-09-05 DIAGNOSIS — N83201 Unspecified ovarian cyst, right side: Secondary | ICD-10-CM

## 2020-09-05 NOTE — Progress Notes (Signed)
U/s orders entered per Dr Alysia Penna

## 2020-09-11 IMAGING — US US MFM OB LIMITED
1 series · 14 of 26 positions shown · non-contrast
Comparison: none

[Series 1: us mfm ob limited · 14 of 26 slices shown]
[im 1/26]
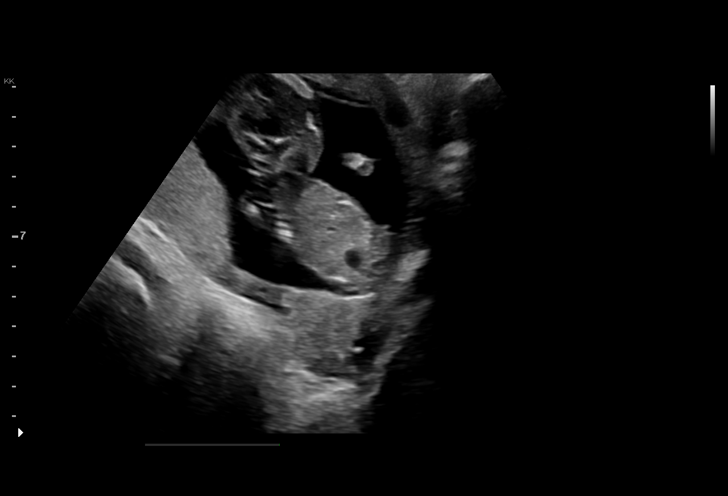
[im 3/26]
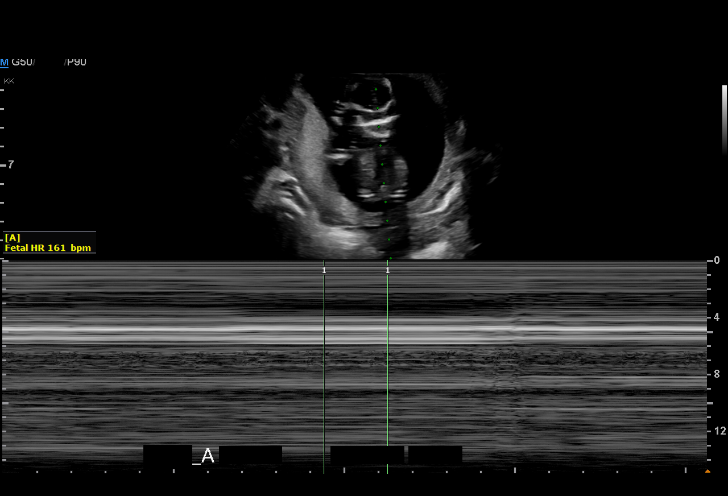
[im 5/26]
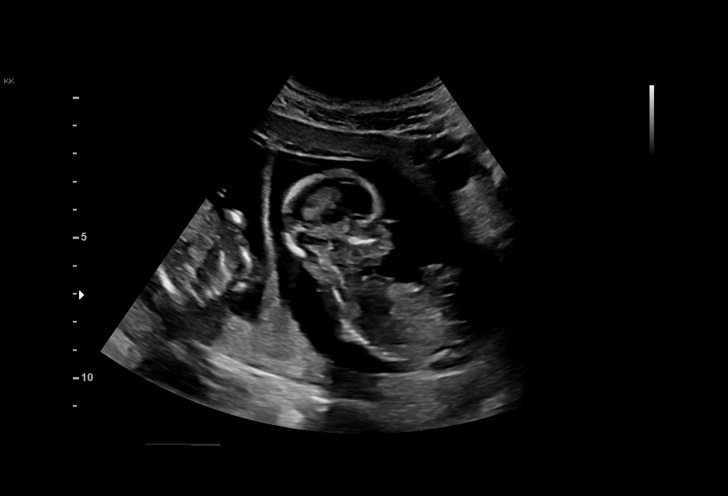
[im 7/26]
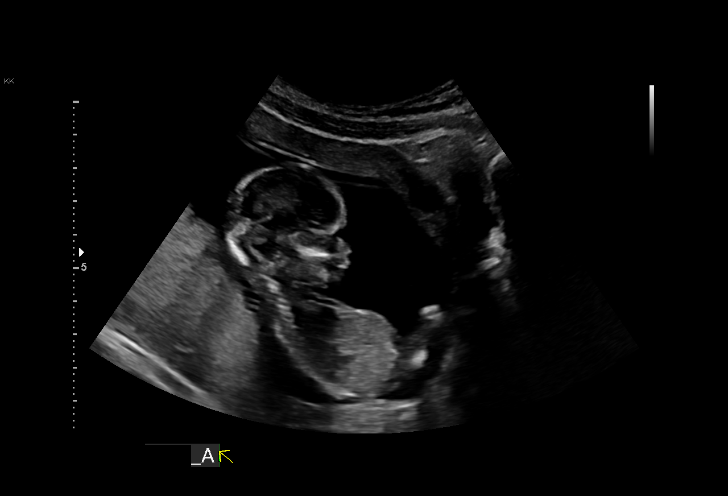
[im 9/26]
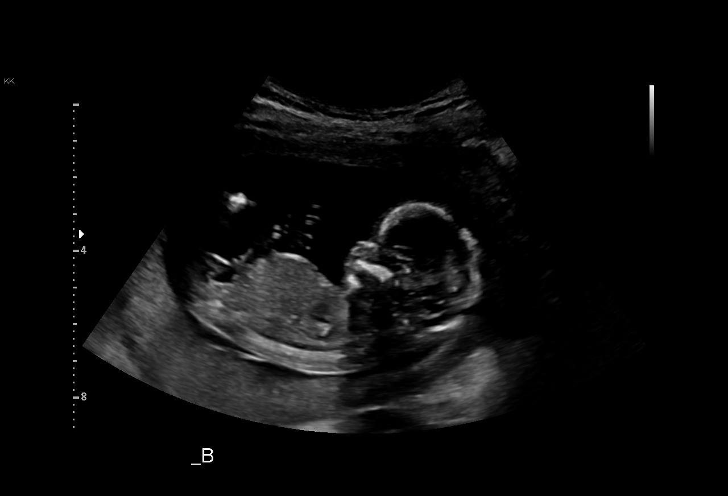
[im 11/26]
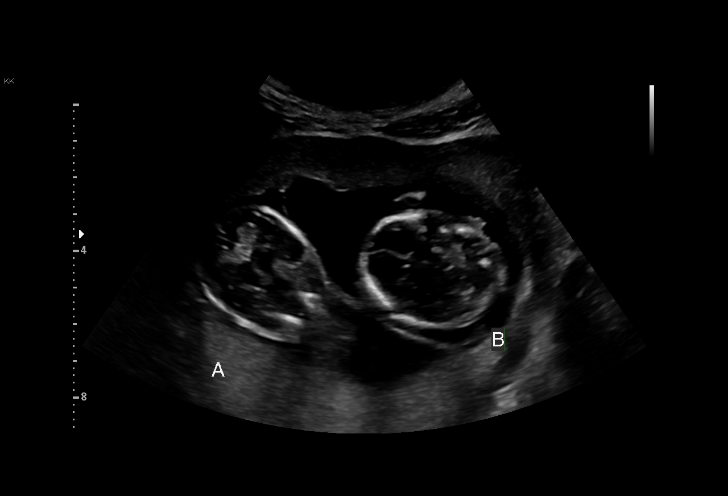
[im 13/26]
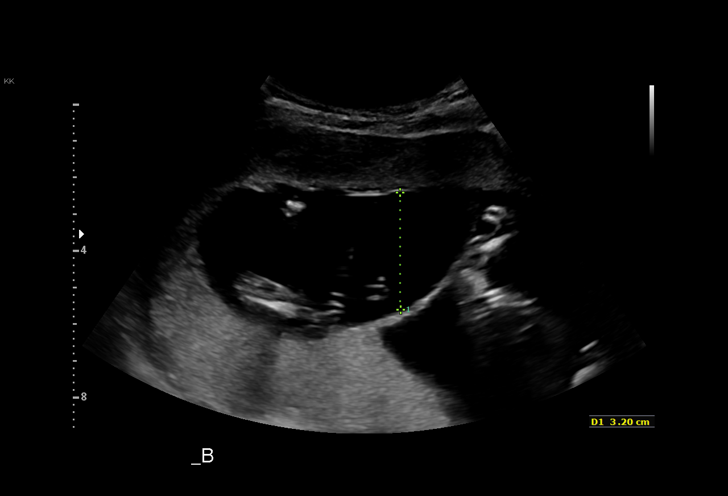
[im 14/26]
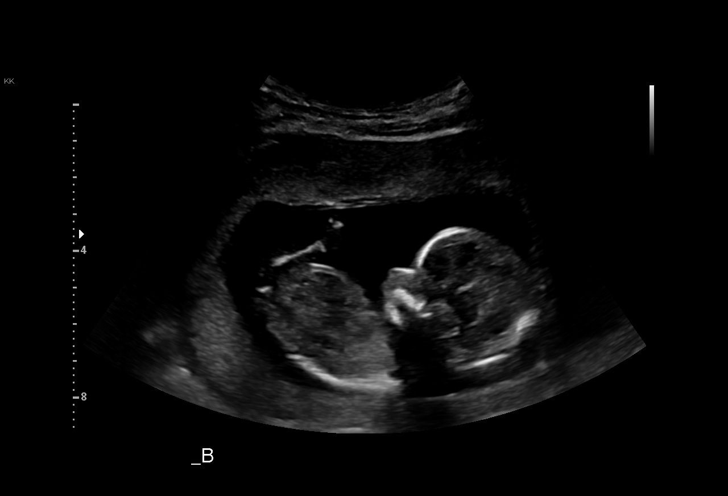
[im 16/26]
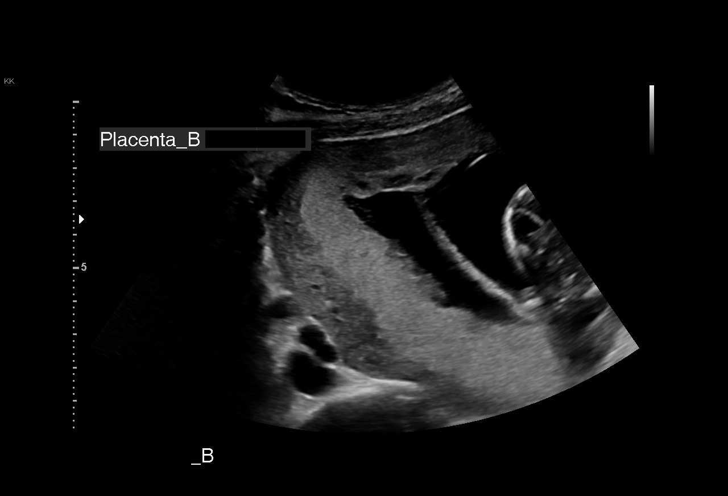
[im 18/26]
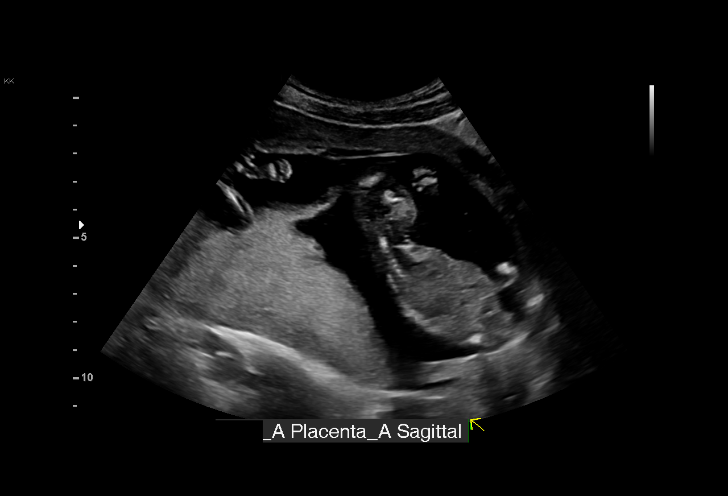
[im 20/26]
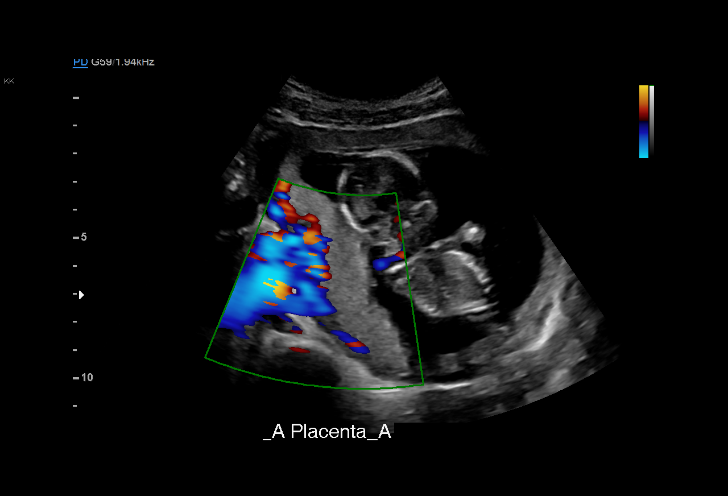
[im 22/26]
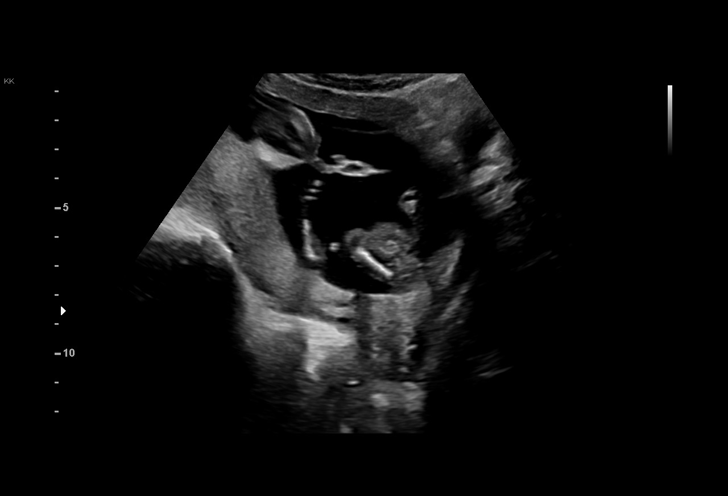
[im 24/26]
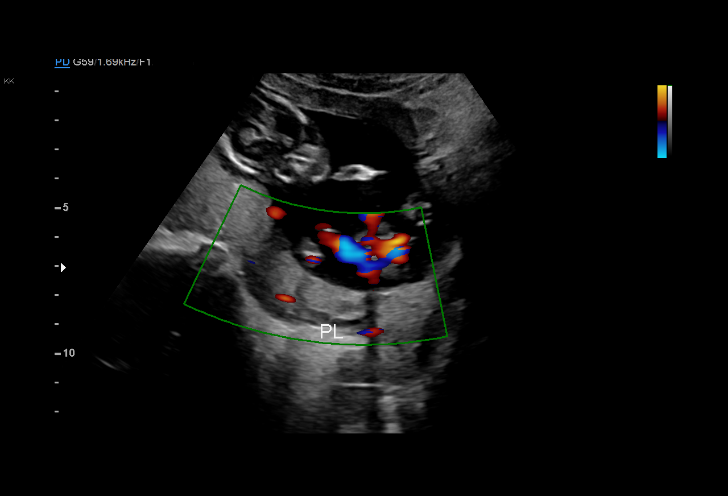
[im 26/26]
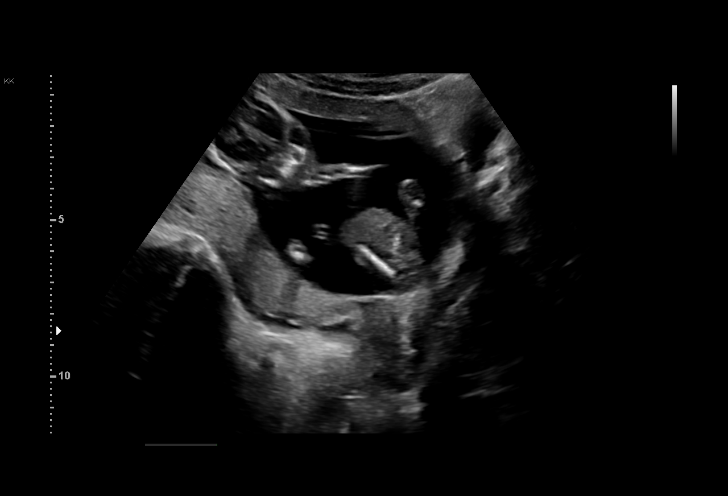

[14 of 26 positions shown; findings below may reference images not displayed]

1  US MFM OB LIMITED                    76815.01     ODD-MARTIN TORNES
 ----------------------------------------------------------------------

 ----------------------------------------------------------------------
Indications

  Vaginal bleeding in pregnancy, second
  trimester
  Previous cesarean delivery, antepartum
  Twin pregnancy, di/di, second trimester
  15 weeks gestation of pregnancy
 ----------------------------------------------------------------------
Fetal Evaluation (Fetus A)

 Num Of Fetuses:          2
 Fetal Heart Rate(bpm):   161
 Cardiac Activity:        Observed
 Fetal Lie:               Lower Fetus
 Presentation:            Breech
 Placenta:                Posterior
 P. Cord Insertion:       Not well visualized
 Membrane Desc:      Dividing Membrane seen - Dichorionic.

 Amniotic Fluid
 AFI FV:      Within normal limits

                             Largest Pocket(cm)

OB History

 Gravidity:    3         Term:   1        Prem:   0        SAB:   0
 TOP:          1       Ectopic:  0        Living: 1
Gestational Age (Fetus A)

 LMP:           12w 1d        Date:  06/22/18                 EDD:   03/29/19
 Best:          15w 3d     Det. By:  Early Ultrasound         EDD:   03/06/19
                                     (08/22/18)
Fetal Evaluation (Fetus B)
 Num Of Fetuses:          2
 Fetal Heart Rate(bpm):   165
 Cardiac Activity:        Observed
 Fetal Lie:               Upper Fetus
 Presentation:            Cephalic
 Placenta:                Posterior
 P. Cord Insertion:       Not well visualized
 Membrane Desc:      Dividing Membrane seen - Dichorionic.

 Amniotic Fluid
 AFI FV:      Within normal limits

                             Largest Pocket(cm)


 Comment:    No placental abruption identified.
Gestational Age (Fetus B)

 LMP:           12w 1d        Date:  06/22/18                 EDD:   03/29/19
 Best:          15w 3d     Det. By:  Early Ultrasound         EDD:   03/06/19
                                     (08/22/18)
Cervix Uterus Adnexa

 Cervix
 Closed
Recommendations

 Patient with dichorionic-diamniotic twin pregnancy was
 evaluated in the [facility] for c/o vaginal bleeding.
 Dichorionic-diamniotic twin pregnancy with good fetal heart
 activities are seen in both fetuses. Amniotic fluid is normal in
 both sacs. Placenta appears normal.
                 Ceejay, Paulus N

## 2020-10-17 IMAGING — US US MFM OB LIMITED
1 series · 15 of 19 positions shown · non-contrast
Comparison: none

[Series 1: us mfm ob limited · 19 acquisitions, 15 frames shown]
[im 1/19]
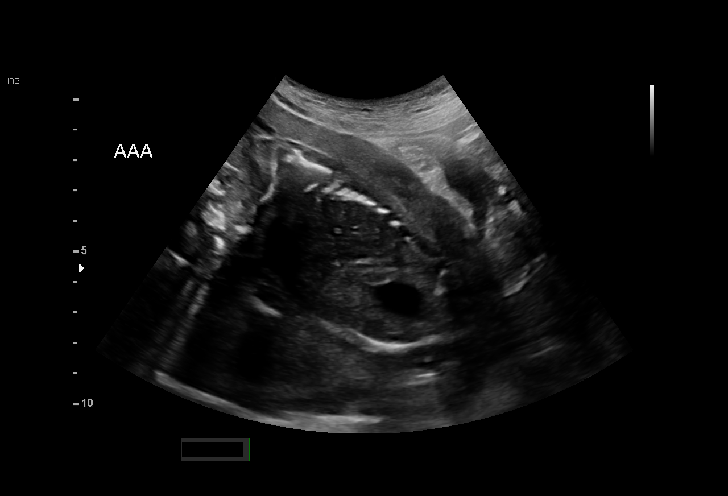
[im 2/19]
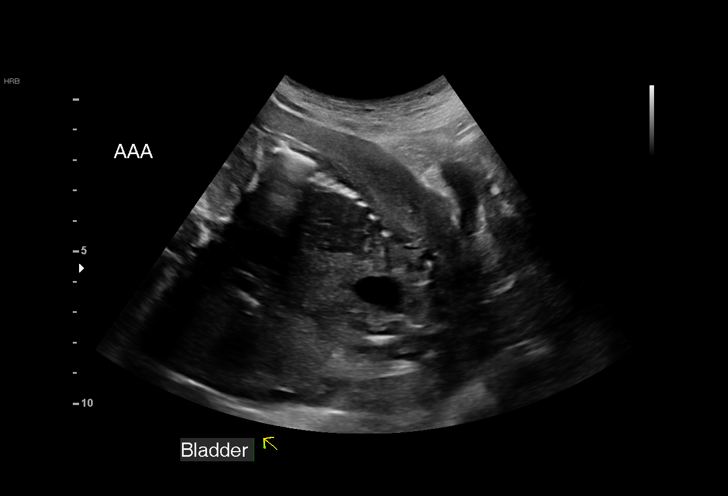
[im 4/19]
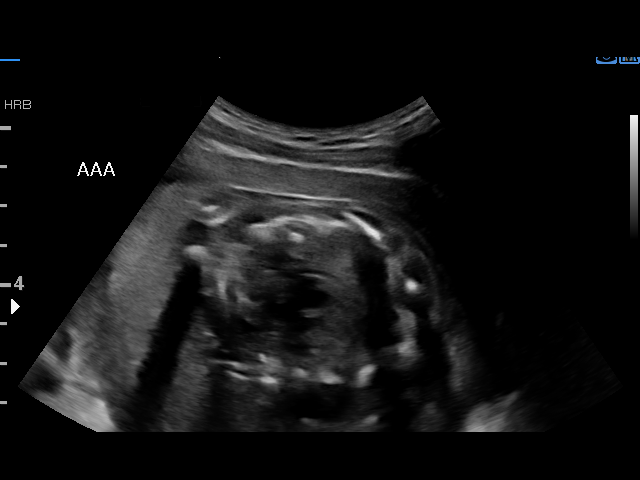
[im 5/19]
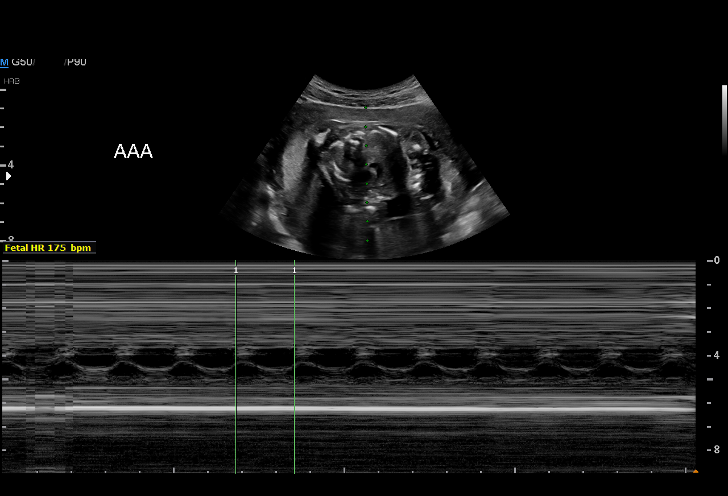
[im 6/19]
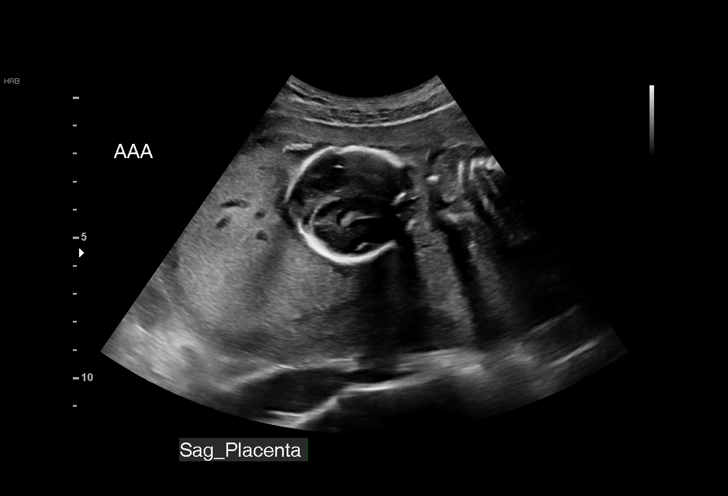
[im 7/19]
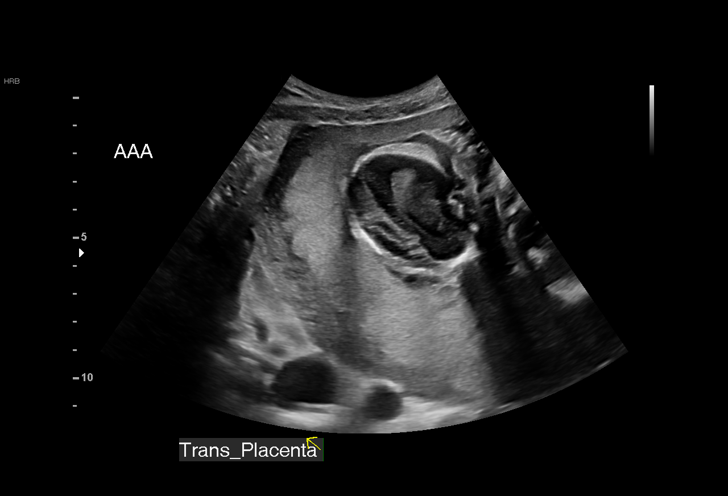
[im 9/19]
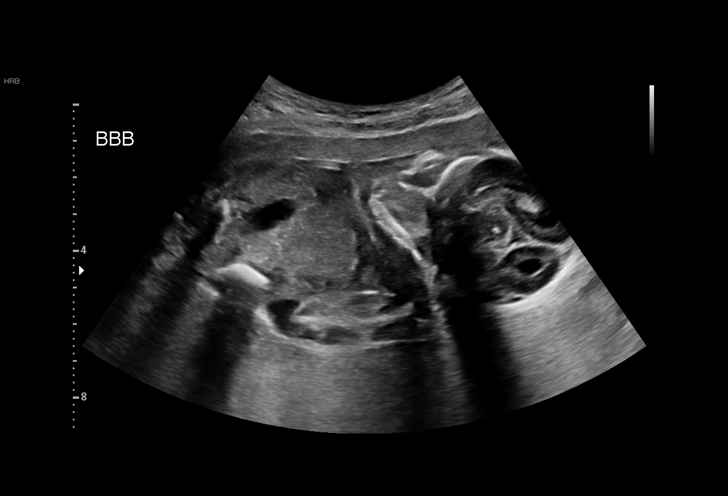
[im 10/19]
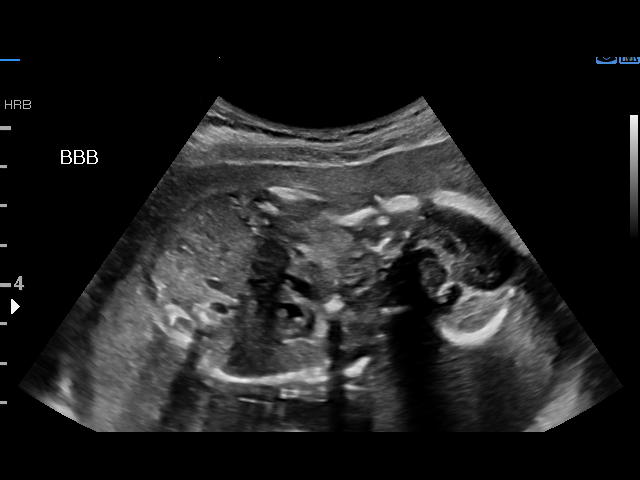
[im 11/19]
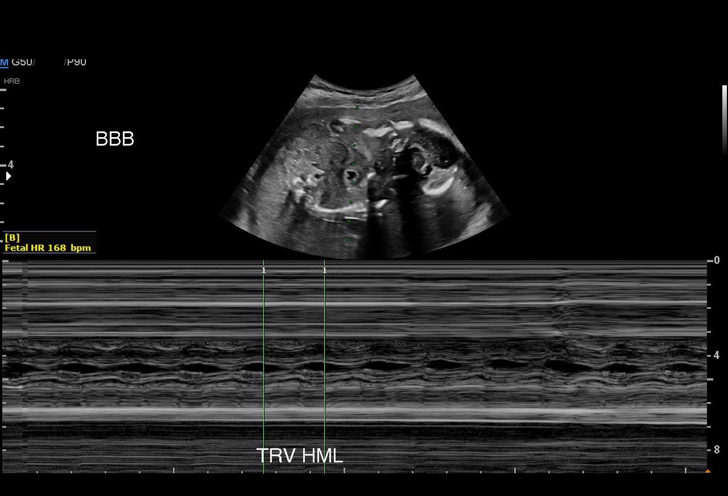
[im 13/19]
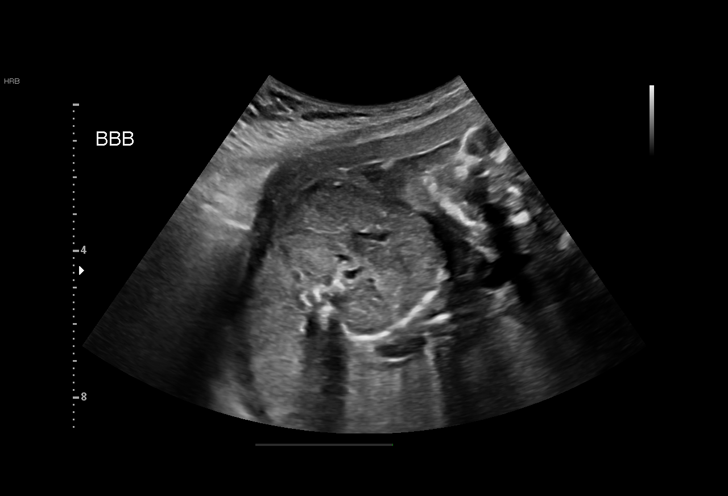
[im 14/19]
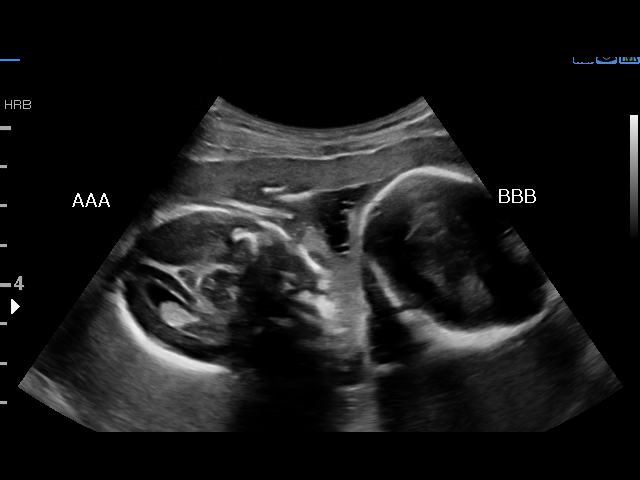
[im 15/19]
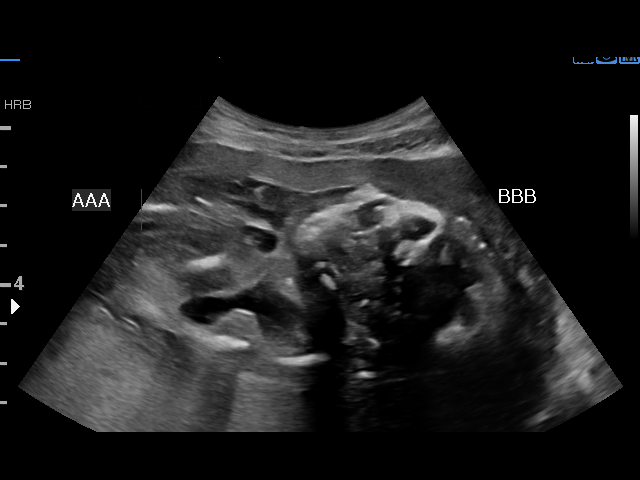
[im 16/19]
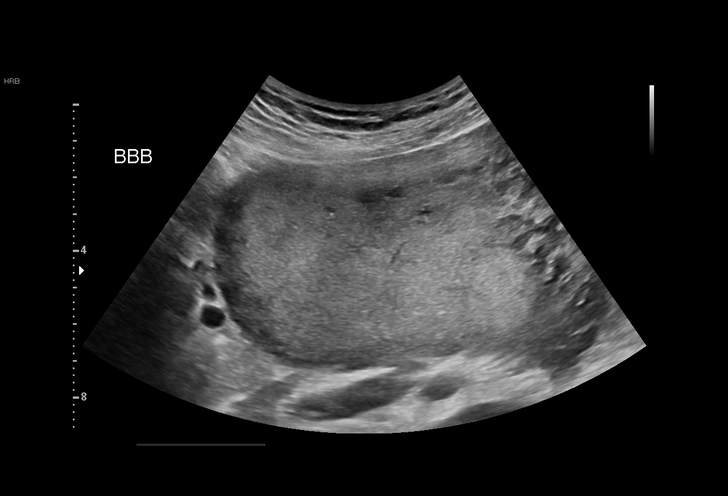
[im 18/19]
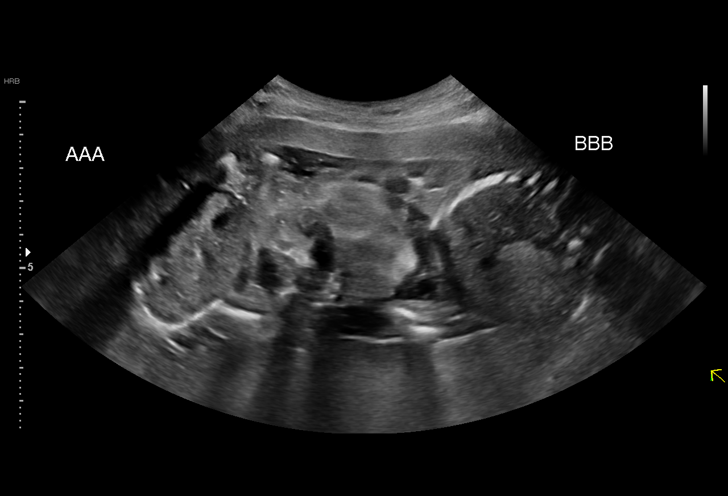
[im 19/19]
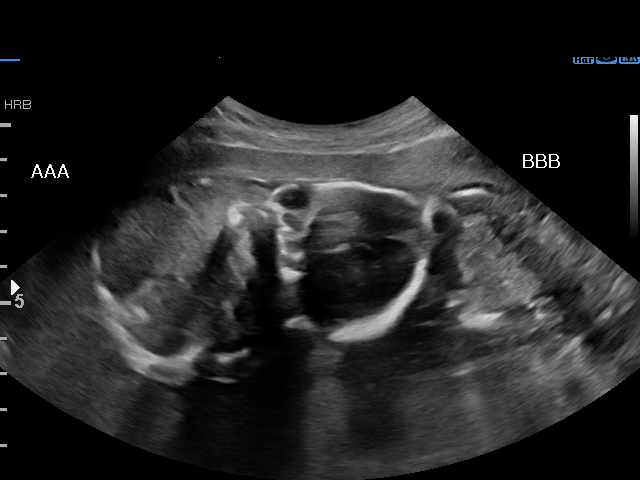

[15 of 19 positions shown; findings below may reference images not displayed]

1  US MFM OB LIMITED                    76815.01     DANII AUJLA
 ----------------------------------------------------------------------

 ----------------------------------------------------------------------
Indications

  Twin pregnancy, di/di, second trimester
  Previous cesarean delivery, antepartum
  20 weeks gestation of pregnancy
  Vaginal bleeding in pregnancy, second
  trimester (heavy bleeding, passing clots x 1
  month)
  Leakage of amniotic fluid - gushing x1 day
 ----------------------------------------------------------------------
Vital Signs

 BMI:
Fetal Evaluation (Fetus A)

 Num Of Fetuses:         2
 Fetal Heart Rate(bpm):  175
 Cardiac Activity:       Observed
 Fetal Lie:              Maternal right side
 Presentation:           Breech
 Placenta:               Posterior

 Amniotic Fluid
 AFI FV:      Anhydramnios
OB History

 Gravidity:    3         Term:   1        Prem:   0        SAB:   0
 TOP:          1       Ectopic:  0        Living: 1
Gestational Age (Fetus A)

 LMP:           17w 2d        Date:  06/22/18                 EDD:   03/29/19
 Best:          20w 4d     Det. By:  Early Ultrasound         EDD:   03/06/19
                                     (08/22/18)
Anatomy (Fetus A)

 Stomach:               Appears normal, left   Bladder:                Appears normal
                        sided

Fetal Evaluation (Fetus B)

 Num Of Fetuses:         2
 Fetal Heart Rate(bpm):  168
 Cardiac Activity:       Observed
 Fetal Lie:              Maternal left side
 Presentation:           Transverse, head to maternal left
 Placenta:               Posterior

 Amniotic Fluid
 AFI FV:      Anhydramnios
Gestational Age (Fetus B)

 LMP:           17w 2d        Date:  06/22/18                 EDD:   03/29/19
 Best:          20w 4d     Det. By:  Early Ultrasound         EDD:   03/06/19
                                     (08/22/18)
Anatomy (Fetus B)

 Stomach:               Absence of fluid       Bladder:                Appears normal
                        filled stomach
Cervix Uterus Adnexa

 Cervix
 Length:            2.4  cm.
Impression

 Live dichorionic-diamniotic twin gestation with anhydramnios
 in both amniotic sacs
Recommendations

 Please refer to [REDACTED] EMR for detailed consultation and
 recommended plan of care.
                Espeel, Witten

## 2020-10-27 ENCOUNTER — Other Ambulatory Visit: Payer: Self-pay

## 2020-10-27 ENCOUNTER — Ambulatory Visit
Admission: RE | Admit: 2020-10-27 | Discharge: 2020-10-27 | Disposition: A | Discharge status: Home / Self Care | Payer: Medicaid Other | Source: Ambulatory Visit | Attending: Obstetrics and Gynecology | Admitting: Obstetrics and Gynecology

## 2020-10-27 DIAGNOSIS — N83201 Unspecified ovarian cyst, right side: Secondary | ICD-10-CM | POA: Diagnosis present

## 2020-11-09 ENCOUNTER — Ambulatory Visit: Payer: Medicaid Other

## 2021-01-25 ENCOUNTER — Other Ambulatory Visit: Payer: Self-pay

## 2021-01-25 ENCOUNTER — Ambulatory Visit (INDEPENDENT_AMBULATORY_CARE_PROVIDER_SITE_OTHER): Payer: Medicaid Other

## 2021-01-25 DIAGNOSIS — Z3200 Encounter for pregnancy test, result unknown: Secondary | ICD-10-CM | POA: Diagnosis not present

## 2021-01-25 LAB — POCT URINE PREGNANCY: Preg Test, Ur: NEGATIVE

## 2021-01-25 NOTE — Progress Notes (Signed)
Phyllis Alexander presents today for UPT. She is here for a restart on depo-provera injection.    OBJECTIVE: Appears well, in no apparent distress.  OB History    Gravida  4   Para  3   Term  2   Preterm  0   AB  1   Living  2     SAB  0   IAB  1   Ectopic  0   Multiple  1   Live Births  4        Obstetric Comments  Previable twins delivery with PPROM        In-Office UPT result: negative  I have reviewed the patient's medical, obstetrical, social, and family histories, and medications.   ASSESSMENT: Positive pregnancy test  PLAN Return in two weeks for upt and restart depo-provera.

## 2021-02-08 ENCOUNTER — Ambulatory Visit: Payer: Medicaid Other

## 2021-02-08 ENCOUNTER — Other Ambulatory Visit: Payer: Self-pay

## 2021-02-08 MED ORDER — MEDROXYPROGESTERONE ACETATE 150 MG/ML IM SUSP: 150.0000 mg | INTRAMUSCULAR | 0 refills | Status: DC

## 2021-02-09 ENCOUNTER — Ambulatory Visit: Payer: Medicaid Other

## 2021-03-23 ENCOUNTER — Other Ambulatory Visit (HOSPITAL_BASED_OUTPATIENT_CLINIC_OR_DEPARTMENT_OTHER): Payer: Self-pay

## 2021-03-23 ENCOUNTER — Ambulatory Visit: Payer: Medicaid Other

## 2021-03-28 ENCOUNTER — Ambulatory Visit: Payer: Medicaid Other

## 2021-04-03 ENCOUNTER — Ambulatory Visit: Payer: Medicaid Other

## 2021-05-09 ENCOUNTER — Other Ambulatory Visit: Payer: Self-pay

## 2021-05-09 ENCOUNTER — Encounter (HOSPITAL_COMMUNITY): Payer: Self-pay

## 2021-05-09 ENCOUNTER — Emergency Department (HOSPITAL_COMMUNITY)
Admission: EM | Admit: 2021-05-09 | Discharge: 2021-05-10 | Disposition: A | Discharge status: Home / Self Care | Payer: Medicaid Other | Attending: Emergency Medicine | Admitting: Emergency Medicine

## 2021-05-09 DIAGNOSIS — R197 Diarrhea, unspecified: Secondary | ICD-10-CM | POA: Insufficient documentation

## 2021-05-09 DIAGNOSIS — R1032 Left lower quadrant pain: Secondary | ICD-10-CM | POA: Diagnosis not present

## 2021-05-09 DIAGNOSIS — R1031 Right lower quadrant pain: Secondary | ICD-10-CM | POA: Insufficient documentation

## 2021-05-09 DIAGNOSIS — Z87891 Personal history of nicotine dependence: Secondary | ICD-10-CM | POA: Diagnosis not present

## 2021-05-09 DIAGNOSIS — R112 Nausea with vomiting, unspecified: Secondary | ICD-10-CM | POA: Diagnosis not present

## 2021-05-09 DIAGNOSIS — R102 Pelvic and perineal pain: Secondary | ICD-10-CM | POA: Insufficient documentation

## 2021-05-09 DIAGNOSIS — Z20822 Contact with and (suspected) exposure to covid-19: Secondary | ICD-10-CM | POA: Diagnosis not present

## 2021-05-09 DIAGNOSIS — R1084 Generalized abdominal pain: Secondary | ICD-10-CM

## 2021-05-09 DIAGNOSIS — D696 Thrombocytopenia, unspecified: Secondary | ICD-10-CM | POA: Diagnosis not present

## 2021-05-09 LAB — COMPREHENSIVE METABOLIC PANEL
ALT: 17 U/L (ref 0–44)
AST: 33 U/L (ref 15–41)
Albumin: 4.4 g/dL (ref 3.5–5.0)
Alkaline Phosphatase: 37 U/L — ABNORMAL LOW (ref 38–126)
Anion gap: 8 (ref 5–15)
BUN: 10 mg/dL (ref 6–20)
CO2: 23 mmol/L (ref 22–32)
Calcium: 9.5 mg/dL (ref 8.9–10.3)
Chloride: 107 mmol/L (ref 98–111)
Creatinine, Ser: 0.91 mg/dL (ref 0.44–1.00)
GFR, Estimated: >60 mL/min (ref >60)
Glucose, Bld: 85 mg/dL (ref 70–99)
Potassium: 4.3 mmol/L (ref 3.5–5.1)
Sodium: 138 mmol/L (ref 135–145)
Total Bilirubin: 1.3 mg/dL — ABNORMAL HIGH (ref 0.3–1.2)
Total Protein: 7.9 g/dL (ref 6.5–8.1)

## 2021-05-09 LAB — I-STAT BETA HCG BLOOD, ED (MC, WL, AP ONLY): I-stat hCG, quantitative: <5 m[IU]/mL

## 2021-05-09 LAB — URINALYSIS, ROUTINE W REFLEX MICROSCOPIC
Bacteria, UA: NONE SEEN
Bilirubin Urine: NEGATIVE
Glucose, UA: NEGATIVE mg/dL
Hgb urine dipstick: NEGATIVE
Ketones, ur: 80 mg/dL — AB
Leukocytes,Ua: NEGATIVE
Nitrite: NEGATIVE
Protein, ur: 100 mg/dL — AB
Specific Gravity, Urine: 1.025 (ref 1.005–1.030)
pH: 8 (ref 5.0–8.0)

## 2021-05-09 LAB — CBC
HCT: 38.5 % (ref 36.0–46.0)
Hemoglobin: 12.7 g/dL (ref 12.0–15.0)
MCH: 30.3 pg (ref 26.0–34.0)
MCHC: 33 g/dL (ref 30.0–36.0)
MCV: 91.9 fL (ref 80.0–100.0)
Platelets: 125 10*3/uL — ABNORMAL LOW (ref 150–400)
RBC: 4.19 MIL/uL (ref 3.87–5.11)
RDW: 14 % (ref 11.5–15.5)
WBC: 7.7 10*3/uL (ref 4.0–10.5)
nRBC: 0 % (ref 0.0–0.2)

## 2021-05-09 LAB — LIPASE, BLOOD: Lipase: 27 U/L (ref 11–51)

## 2021-05-09 MED ORDER — ONDANSETRON HCL 4 MG/2ML IJ SOLN
4.0000 mg | Freq: Once | INTRAMUSCULAR | Status: AC
  Administered 2021-05-09: 23:00:00 4 mg via INTRAVENOUS
  Filled 2021-05-09: qty 2

## 2021-05-09 NOTE — ED Provider Notes (Signed)
[facility]-EMERGENCY DEPT Provider Note   CSN: [id number] Arrival date & time: 05/09/21  2000     History Chief Complaint  Patient presents with   Abdominal Pain    Phyllis Alexander is a 25 y.o. female who presents with concern for progressively worsening bilateral lower abdominal pain with associated with nausea/vomiting/diarrhea today.  She states that if she is supposed to start her period this week, and initially thought this was menstrual cramping, however has not had any onset of vaginal bleeding or discharge.  LMP was 4 weeks ago.  She denies any OTC analgesia or antiemetic. Endorses 5 episodes of watery diarrhea and 7 episodes of NBNB emesis today, intractable nausea.  She has not been icing his COVID-19, denies any recent sick exposures to her knowledge.  I personally reviewed the patient's medical records.  She has history of Csection and VBAC.  HPI     Past Medical History:  Diagnosis Date   Encounter for supervision of normal pregnancy, unspecified, unspecified trimester 11/11/2019    Nursing Staff Provider Office Location  [facility] Dating  LMP  Language  English Anatomy US   f/u in 4 weeks  Flu Vaccine  Declined 11/11/19 Genetic Screen  NIPS: low risks AFP: 11/11/2019  neg  TDaP vaccine   Given 01/13/20 Hgb A1C or  GTT Early  Third trimester nl 2 hour Rhogam  NA   LAB RESULTS  Feeding Plan Breast Blood Type --/--/A POS (05/03 0024)  Contraception Depo Antibody NEG (05/03 0024) Ci   Medical history non-contributory    Pregnancy affected by fetal growth restriction 03/09/2020   Previous cesarean delivery affecting pregnancy, antepartum 11/11/2019   Desires TOLAC   VBAC (vaginal birth after Cesarean) 03/14/2020    Patient Active Problem List   Diagnosis Date Noted   Chlamydia 08/02/2020   History of pregnancy complication 11/11/2019    Past Surgical History:  Procedure Laterality Date   CESAREAN SECTION     DILATION AND CURETTAGE OF UTERUS     WISDOM  TOOTH EXTRACTION       OB History     Gravida  4   Para  3   Term  2   Preterm  0   AB  1   Living  2      SAB  0   IAB  1   Ectopic  0   Multiple  1   Live Births  4        Obstetric Comments  Previable twins delivery with PPROM         Family History  Problem Relation Age of Onset   Diabetes Other    ADD / ADHD Brother    Hypertension Maternal Aunt    Arthritis Maternal Grandmother    Diabetes Maternal Grandmother    Hypertension Maternal Grandmother    Stroke Maternal Grandmother    Arthritis Paternal Grandmother     Social History   Tobacco Use   Smoking status: Former    Packs/day: 0.50    Pack years: 0.00    Types: Cigarettes    Quit date: 08/15/2018    Years since quitting: 2.7   Smokeless tobacco: Never  Vaping Use   Vaping Use: Never used  Substance Use Topics   Alcohol use: No   Drug use: Never    Home Medications Prior to Admission medications   Medication Sig Start Date End Date Taking? Authorizing Provider  cephALEXin (KEFLEX) 500 MG capsule Take 1 capsule (500  mg total) by mouth 3 (three) times daily. Patient not taking: Reported on 08/23/2020 07/21/20   Petrucelli, Pleas Koch, PA-C  medroxyPROGESTERone (DEPO-PROVERA) 150 MG/ML injection Inject 1 mL (150 mg total) into the muscle every 3 (three) months. 08/23/20   Nugent, Odie Sera, NP  medroxyPROGESTERone (DEPO-PROVERA) 150 MG/ML injection Inject 1 mL (150 mg total) into the muscle every 3 (three) months. 02/08/21   Adam Phenix, MD  naproxen (NAPROSYN) 500 MG tablet Take 1 tablet (500 mg total) by mouth 2 (two) times daily as needed for moderate pain. Patient not taking: Reported on 08/02/2020 07/21/20   Petrucelli, Samantha R, PA-C  ondansetron (ZOFRAN ODT) 4 MG disintegrating tablet Take 1 tablet (4 mg total) by mouth every 8 (eight) hours as needed for nausea or vomiting. Patient not taking: Reported on 08/02/2020 07/21/20   Petrucelli, Pleas Koch, PA-C    Allergies     Patient has no known allergies.  Review of Systems   Review of Systems  Constitutional:  Positive for appetite change and chills. Negative for activity change, diaphoresis, fatigue and fever.  HENT: Negative.    Respiratory: Negative.    Cardiovascular: Negative.   Gastrointestinal:  Positive for abdominal pain, diarrhea, nausea and vomiting.  Genitourinary: Negative.  Negative for decreased urine volume, menstrual problem, pelvic pain, vaginal bleeding and vaginal discharge.  Musculoskeletal: Negative.   Skin: Negative.   Neurological: Negative.    Physical Exam Updated Vital Signs BP 108/62   Pulse 62   Temp 98.5 F (36.9 C) (Oral)   Resp 16   Ht 5\' 8"  (1.727 m)   Wt 64.9 kg   LMP 04/09/2021 (Approximate)   SpO2 99%   BMI 21.74 kg/m   Physical Exam Constitutional:      Appearance: She is normal weight. She is ill-appearing. She is not toxic-appearing.     Comments: Shivering in the hospital room.  HENT:     Nose: Nose normal.     Mouth/Throat:     Pharynx: Oropharynx is clear. Uvula midline. No uvula swelling.     Tonsils: No tonsillar exudate.  Eyes:     General: Lids are normal. Vision grossly intact.     Extraocular Movements: Extraocular movements intact.     Conjunctiva/sclera: Conjunctivae normal.     Pupils: Pupils are equal, round, and reactive to light.  Neck:     Trachea: Trachea and phonation normal.  Cardiovascular:     Rate and Rhythm: Normal rate and regular rhythm.     Pulses: Normal pulses.     Heart sounds: Normal heart sounds. No murmur heard. Pulmonary:     Effort: Pulmonary effort is normal.     Breath sounds: Normal breath sounds. No decreased breath sounds, rhonchi or rales.  Chest:     Chest wall: No mass, lacerations, deformity, swelling, tenderness, crepitus or edema.  Abdominal:     Palpations: Abdomen is soft.     Tenderness: There is abdominal tenderness in the right lower quadrant, suprapubic area and left lower quadrant. There  is no guarding or rebound.  Musculoskeletal:     Cervical back: Normal range of motion. No edema, rigidity or crepitus. No pain with movement, spinous process tenderness or muscular tenderness.     Right lower leg: No edema.     Left lower leg: No edema.  Neurological:     Mental Status: She is alert and oriented to person, place, and time.    ED Results / Procedures / Treatments  Labs (all labs ordered are listed, but only abnormal results are displayed) Labs Reviewed  COMPREHENSIVE METABOLIC PANEL - Abnormal; Notable for the following components:      Result Value   Alkaline Phosphatase 37 (*)    Total Bilirubin 1.3 (*)    All other components within normal limits  CBC - Abnormal; Notable for the following components:   Platelets 125 (*)    All other components within normal limits  URINALYSIS, ROUTINE W REFLEX MICROSCOPIC - Abnormal; Notable for the following components:   APPearance HAZY (*)    Ketones, ur 80 (*)    Protein, ur 100 (*)    All other components within normal limits  RESP PANEL BY RT-PCR (FLU A&B, COVID) ARPGX2  LIPASE, BLOOD  RAPID URINE DRUG SCREEN, HOSP PERFORMED  I-STAT BETA HCG BLOOD, ED ([facility], [facility], [facility] ONLY)    EKG None  Radiology No results found.  Procedures Procedures   Medications Ordered in ED Medications  morphine 4 MG/ML injection 4 mg (has no administration in time range)  ondansetron (ZOFRAN) injection 4 mg (4 mg Intravenous Given 05/09/21 2312)    ED Course  I have reviewed the triage vital signs and the nursing notes.  Pertinent labs & imaging results that were available during my care of the patient were reviewed by me and considered in my medical decision making (see chart for details).  Clinical Course as of 05/10/21 0031  Tue May 09, 2021  2148 Pulse Rate: 63 [RS]    Clinical Course User Index [RS] Viren Lebeau, Eugene Gaviaebekah R, PA-C   MDM Rules/Calculators/A&P                         25 year old female presents with concern for  less than 24 hours of generalized abdominal pain, nausea, vomiting, and diarrhea.  Due to start her menstrual cycle today.  The differential diagnosis of diarrhea includes but is not limited to: Viral: norovirus/rotavirus; COVID-19, influenza Bacterial-Campylobacter,Shigella, Salmonella, Escherichia coli, E. coli 0157:H7, Yersinia enterocolitica, Vibrio cholerae, Clostridium difficile. Parasitic- Giardia lamblia, Cryptosporidium,Entamoeba histolytica,Cyclospora, Microsporidium. Toxin- Staphylococcus aureus, Bacillus cereus.  Noninfectious causes include GI Bleed, Appendicitis, Mesenteric Ischemia, Diverticulitis, Adrenal Crisis, Thyroid Storm, Toxicologic exposures, Antibiotic or drug-associated, inflammatory bowel disease. Must also consider gynecologic source for pain such as mittelschmerz, ovarian cyst/torsion, PID.  Vital signs are normal intake.  Cardiopulmonary exam is normal, abdominal exam is significant for generalized lower abdominal tenderness palpation without rebound or guarding.  No CVAT.  Patient is shivering in the bed, though afebrile.  Denies any GU symptoms.  Doubt PID given lack of GU symptoms and generalized nature of her lower abdominal pain. CBC with thrombocytopenia with platelets of 125, CMP with mildly elevated total bili to 1.3.  UA with ketonuria and proteinuria, however no nitrites or bacteria.   At time of shift change, patient is awaiting the remainder of her laboratory studies as well as her COVID-19 test.  Will likely be discharged home pending improvement in her nausea and abdominal pain as well as reassuring laboratory studies.  Care of this patient signed out to oncoming ED provider Dierdre ForthHannah Muthersbaugh, PA-C at time of shift change.  All pertinent HPI, physical exam, laboratory findings were discussed with her prior to my departure.  I appreciate her collaboration of care with patient.  This chart was dictated using voice recognition software, Dragon. Despite the  best efforts of this provider to proofread and correct errors, errors may still occur which can change documentation  meaning.  Final Clinical Impression(s) / ED Diagnoses Final diagnoses:  None    Rx / DC Orders ED Discharge Orders     None        Paris Lore, PA-C 05/10/21 0031    Virgina Norfolk, DO 05/10/21 2321

## 2021-05-09 NOTE — ED Provider Notes (Addendum)
Care assumed from [facility].  Please see her full H&P.  In short,  Phyllis Alexander is a 25 y.o. female presents for progressively worsening abdominal pain associated nausea, vomiting and diarrhea today.  Patient endorses numerous episodes of watery diarrhea and nonbloody nonbilious emesis.  Upon further evaluation, patient does admit to smoking marijuana at least daily.  Does report some previous episodes similar to this with abdominal pain and vomiting.  Physical Exam  BP 107/69   Pulse 63   Temp 98.5 F (36.9 C) (Oral)   Resp 16   Ht 5\' 8"  (1.727 m)   Wt 64.9 kg   LMP 04/09/2021 (Approximate)   SpO2 100%   BMI 21.74 kg/m   Physical Exam Vitals and nursing note reviewed.  Constitutional:      General: She is not in acute distress.    Appearance: She is well-developed. She is not ill-appearing.  HENT:     Head: Normocephalic.  Eyes:     General: No scleral icterus.    Conjunctiva/sclera: Conjunctivae normal.  Cardiovascular:     Rate and Rhythm: Normal rate.  Pulmonary:     Effort: Pulmonary effort is normal.  Abdominal:     General: There is no distension.     Palpations: Abdomen is soft.     Tenderness: There is no abdominal tenderness. There is no guarding or rebound.  Musculoskeletal:        General: Normal range of motion.     Cervical back: Normal range of motion.  Skin:    General: Skin is warm and dry.  Neurological:     Mental Status: She is alert.  Psychiatric:        Mood and Affect: Mood normal.    ED Course/Procedures   Clinical Course as of 05/10/21 0225  Tue May 09, 2021  2148 Pulse Rate: 63 [RS]    Clinical Course User Index [RS] 2149, PA-C    Results for orders placed or performed during the hospital encounter of 05/09/21  Resp Panel by RT-PCR (Flu A&B, Covid) Nasopharyngeal Swab   Specimen: Nasopharyngeal Swab; Nasopharyngeal(NP) swabs in vial transport medium  Result Value Ref Range   SARS Coronavirus 2 by RT PCR  NEGATIVE NEGATIVE   Influenza A by PCR NEGATIVE NEGATIVE   Influenza B by PCR NEGATIVE NEGATIVE  Lipase, blood  Result Value Ref Range   Lipase 27 11 - 51 U/L  Comprehensive metabolic panel  Result Value Ref Range   Sodium 138 135 - 145 mmol/L   Potassium 4.3 3.5 - 5.1 mmol/L   Chloride 107 98 - 111 mmol/L   CO2 23 22 - 32 mmol/L   Glucose, Bld 85 70 - 99 mg/dL   BUN 10 6 - 20 mg/dL   Creatinine, Ser 05/11/21 0.44 - 1.00 mg/dL   Calcium 9.5 8.9 - 9.62 mg/dL   Total Protein 7.9 6.5 - 8.1 g/dL   Albumin 4.4 3.5 - 5.0 g/dL   AST 33 15 - 41 U/L   ALT 17 0 - 44 U/L   Alkaline Phosphatase 37 (L) 38 - 126 U/L   Total Bilirubin 1.3 (H) 0.3 - 1.2 mg/dL   GFR, Estimated 95.2 >84 mL/min   Anion gap 8 5 - 15  CBC  Result Value Ref Range   WBC 7.7 4.0 - 10.5 K/uL   RBC 4.19 3.87 - 5.11 MIL/uL   Hemoglobin 12.7 12.0 - 15.0 g/dL   HCT >13 24.4 - 01.0 %  MCV 91.9 80.0 - 100.0 fL   MCH 30.3 26.0 - 34.0 pg   MCHC 33.0 30.0 - 36.0 g/dL   RDW 25.0 53.9 - 76.7 %   Platelets 125 (L) 150 - 400 K/uL   nRBC 0.0 0.0 - 0.2 %  Urinalysis, Routine w reflex microscopic  Result Value Ref Range   Color, Urine YELLOW YELLOW   APPearance HAZY (A) CLEAR   Specific Gravity, Urine 1.025 1.005 - 1.030   pH 8.0 5.0 - 8.0   Glucose, UA NEGATIVE NEGATIVE mg/dL   Hgb urine dipstick NEGATIVE NEGATIVE   Bilirubin Urine NEGATIVE NEGATIVE   Ketones, ur 80 (A) NEGATIVE mg/dL   Protein, ur 341 (A) NEGATIVE mg/dL   Nitrite NEGATIVE NEGATIVE   Leukocytes,Ua NEGATIVE NEGATIVE   RBC / HPF 0-5 0 - 5 RBC/hpf   WBC, UA 0-5 0 - 5 WBC/hpf   Bacteria, UA NONE SEEN NONE SEEN   Squamous Epithelial / LPF 0-5 0 - 5   Mucus PRESENT    Hyaline Casts, UA PRESENT   Rapid urine drug screen (hospital performed)  Result Value Ref Range   Opiates NONE DETECTED NONE DETECTED   Cocaine NONE DETECTED NONE DETECTED   Benzodiazepines NONE DETECTED NONE DETECTED   Amphetamines NONE DETECTED NONE DETECTED   Tetrahydrocannabinol  POSITIVE (A) NONE DETECTED   Barbiturates NONE DETECTED NONE DETECTED  I-Stat beta hCG blood, ED  Result Value Ref Range   I-stat hCG, quantitative <5.0 <5 mIU/mL   Comment 3           No results found.   Procedures  MDM   Plan: Nausea meds, PO trial then home   2:23 AM Patient reports she is feeling significantly better.  Urine drug screen positive for marijuana.  Patient does admit to smoking every day.  Repeat exam abdomen is soft and nontender.  Patient is tolerating both fluids and solids without difficulty.  No return of pain or vomiting.  Did discuss potential for cannabinoid hyperemesis versus viral gastritis.  UA with evidence of urinary tract infection.  Labs are overall reassuring.  She does have a slight increase in her total bilirubin today.  No evidence of jaundice.  Specifically no tenderness or pain in the right upper quadrant.  Discussed with patient importance of repeat lab work within 1 week to ensure improvement.  Will discharge home with Zofran.  Discussed reasons to return immediately to the emergency department.  Patient states understanding and is in agreement with the plan.  Generalized abdominal pain  Non-intractable vomiting with nausea, unspecified vomiting type    Phyllis Alexander 05/10/21 0225    Sabas Sous, MD 05/10/21 [phone]

## 2021-05-09 NOTE — ED Triage Notes (Signed)
Patient arrived stating she has had lower abdominal pain, NV today. Declines any OTC medication prior to arrival.

## 2021-05-09 NOTE — ED Provider Notes (Signed)
Emergency Medicine Provider Triage Evaluation Note  Phyllis Alexander , a 25 y.o. female  was evaluated in triage.  Pt complains of lateralized abdominal pain, nausea and "spitting up".  Patient states that her symptoms started this morning.  Denies any fevers or chills.  Admits to marijuana and alcohol use yesterday.  Denies any vaginal bleeding or discharge.  Denies any pelvic pain.  Moaning in triage  Review of Systems  Positive: As above Negative: As above  Physical Exam  BP 121/81 (BP Location: Left Arm)   Pulse 76   Temp 98.6 F (37 C) (Oral)   Resp 16   Ht 5\' 8"  (1.727 m)   Wt 64.9 kg   LMP 04/09/2021 (Approximate)   SpO2 100%   BMI 21.74 kg/m  Gen:   Awake, no distress   Resp:  Normal effort  MSK:   Moves extremities without difficulty  Other:  Minimal generalized abdominal tenderness, no CVA tenderness  Medical Decision Making  Medically screening exam initiated at 8:43 PM.  Appropriate orders placed.  Majesta Leichter was informed that the remainder of the evaluation will be completed by another provider, this initial triage assessment does not replace that evaluation, and the importance of remaining in the ED until their evaluation is complete.     [name] 05/09/21 2044    2045, MD 05/10/21 [phone]

## 2021-05-10 LAB — RAPID URINE DRUG SCREEN, HOSP PERFORMED
Amphetamines: NOT DETECTED
Barbiturates: NOT DETECTED
Benzodiazepines: NOT DETECTED
Cocaine: NOT DETECTED
Opiates: NOT DETECTED
Tetrahydrocannabinol: POSITIVE — AB

## 2021-05-10 LAB — RESP PANEL BY RT-PCR (FLU A&B, COVID) ARPGX2
Influenza A by PCR: NEGATIVE
Influenza B by PCR: NEGATIVE
SARS Coronavirus 2 by RT PCR: NEGATIVE

## 2021-05-10 MED ORDER — ONDANSETRON HCL 4 MG PO TABS: 4.0000 mg | ORAL_TABLET | Freq: Three times a day (TID) | ORAL | 0 refills | Status: DC | PRN

## 2021-05-10 MED ORDER — MORPHINE SULFATE (PF) 4 MG/ML IV SOLN
4.0000 mg | Freq: Once | INTRAVENOUS | Status: AC
  Administered 2021-05-10: 4 mg via INTRAVENOUS
  Filled 2021-05-10: qty 1

## 2021-05-10 NOTE — ED Notes (Signed)
Patient tolerated water and crackers just fine.

## 2021-05-10 NOTE — Discharge Instructions (Signed)
1. Medications: zofran, usual home medications °2. Treatment: rest, drink plenty of fluids, advance diet slowly °3. Follow Up: Please followup with your primary doctor in 2 days for discussion of your diagnoses and further evaluation after today's visit; if you do not have a primary care doctor use the resource guide provided to find one; Please return to the ER for persistent vomiting, high fevers or worsening symptoms ° °

## 2021-05-31 ENCOUNTER — Other Ambulatory Visit: Payer: Self-pay

## 2021-05-31 ENCOUNTER — Ambulatory Visit (INDEPENDENT_AMBULATORY_CARE_PROVIDER_SITE_OTHER): Payer: Medicaid Other

## 2021-05-31 VITALS — BP 111/74 | HR 80 | Ht 68.0 in | Wt 134.0 lb

## 2021-05-31 DIAGNOSIS — Z3042 Encounter for surveillance of injectable contraceptive: Secondary | ICD-10-CM

## 2021-05-31 LAB — POCT URINE PREGNANCY: Preg Test, Ur: NEGATIVE

## 2021-05-31 MED ORDER — MEDROXYPROGESTERONE ACETATE 150 MG/ML IM SUSP
150.0000 mg | Freq: Once | INTRAMUSCULAR | Status: AC
  Administered 2021-05-31: 150 mg via INTRAMUSCULAR

## 2021-05-31 NOTE — Progress Notes (Addendum)
SUBJECTIVE: Phyllis Alexander is a 25 y.o. female who presents for DEPO restart.   OBJECTIVE: Appears well, in no apparent distress.  Vital signs are normal.   ASSESSMENT: UPT NEGATIVE Last unprotected sex was 2 months ago LMP: 05/17/2021 Ok to give DEPO per Provider.  PLAN:  Needs Annual before next DEPO.  DEPO due Oct. 5-19, 2022  Administrations This Visit     medroxyPROGESTERone (DEPO-PROVERA) injection 150 mg     Admin Date 05/31/2021 Action Given Dose 150 mg Route Intramuscular Administered By Maretta Bees, RMA

## 2021-06-06 ENCOUNTER — Ambulatory Visit (HOSPITAL_COMMUNITY)
Admission: EM | Admit: 2021-06-06 | Discharge: 2021-06-06 | Disposition: A | Discharge status: Home / Self Care | Payer: Medicaid Other | Attending: Internal Medicine | Admitting: Internal Medicine

## 2021-06-06 ENCOUNTER — Encounter (HOSPITAL_COMMUNITY): Payer: Self-pay | Admitting: *Deleted

## 2021-06-06 ENCOUNTER — Other Ambulatory Visit: Payer: Self-pay

## 2021-06-06 DIAGNOSIS — W409XXA Explosion of unspecified explosive materials, initial encounter: Secondary | ICD-10-CM

## 2021-06-06 DIAGNOSIS — T22111A Burn of first degree of right forearm, initial encounter: Secondary | ICD-10-CM | POA: Diagnosis not present

## 2021-06-06 DIAGNOSIS — Z23 Encounter for immunization: Secondary | ICD-10-CM | POA: Diagnosis not present

## 2021-06-06 DIAGNOSIS — T22511A Corrosion of first degree of right forearm, initial encounter: Secondary | ICD-10-CM

## 2021-06-06 DIAGNOSIS — T6591XA Toxic effect of unspecified substance, accidental (unintentional), initial encounter: Secondary | ICD-10-CM | POA: Diagnosis not present

## 2021-06-06 MED ORDER — SILVER SULFADIAZINE 1 % EX CREA
TOPICAL_CREAM | CUTANEOUS | Status: AC
  Filled 2021-06-06: qty 85

## 2021-06-06 MED ORDER — TETANUS-DIPHTH-ACELL PERTUSSIS 5-2.5-18.5 LF-MCG/0.5 IM SUSY
0.5000 mL | PREFILLED_SYRINGE | Freq: Once | INTRAMUSCULAR | Status: AC
  Administered 2021-06-06: 0.5 mL via INTRAMUSCULAR

## 2021-06-06 MED ORDER — SILVER SULFADIAZINE 1 % EX CREA: 1.0000 "application " | TOPICAL_CREAM | Freq: Every day | CUTANEOUS | 0 refills | Status: DC

## 2021-06-06 MED ORDER — SILVER SULFADIAZINE 1 % EX CREA: TOPICAL_CREAM | Freq: Once | CUTANEOUS | Status: AC

## 2021-06-06 MED ORDER — TETANUS-DIPHTH-ACELL PERTUSSIS 5-2.5-18.5 LF-MCG/0.5 IM SUSY
PREFILLED_SYRINGE | INTRAMUSCULAR | Status: AC
  Filled 2021-06-06: qty 0.5
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
## 2021-06-06 NOTE — ED Triage Notes (Signed)
Pt rinsed  rt arm with cool water

## 2021-06-06 NOTE — Discharge Instructions (Addendum)
Please monitor burn very closely for signs of infection that include increased redness, swelling, pus.  If blisters form, please do not pop them.  Please apply prescribed cream daily to prevent infection and to soothe burn.  May also apply ice application as needed for comfort.  May take over-the-counter pain relievers as needed for pain.  Please go to the hospital if you develop any lethargy, confusion, abdominal pain, nausea, vomiting within 12 to 24 hours of radiator fluid ingestion.

## 2021-06-28 ENCOUNTER — Other Ambulatory Visit (HOSPITAL_COMMUNITY)
Admission: RE | Admit: 2021-06-28 | Discharge: 2021-06-28 | Disposition: A | Discharge status: Home / Self Care | Payer: Medicaid Other | Source: Ambulatory Visit | Attending: Obstetrics | Admitting: Obstetrics

## 2021-06-28 ENCOUNTER — Encounter: Payer: Self-pay | Admitting: Obstetrics

## 2021-06-28 ENCOUNTER — Ambulatory Visit (INDEPENDENT_AMBULATORY_CARE_PROVIDER_SITE_OTHER): Payer: Medicaid Other | Admitting: Obstetrics

## 2021-06-28 ENCOUNTER — Other Ambulatory Visit: Payer: Self-pay

## 2021-06-28 VITALS — BP 125/84 | HR 103 | Ht 68.0 in | Wt 134.3 lb

## 2021-06-28 DIAGNOSIS — Z113 Encounter for screening for infections with a predominantly sexual mode of transmission: Secondary | ICD-10-CM

## 2021-06-28 DIAGNOSIS — N898 Other specified noninflammatory disorders of vagina: Secondary | ICD-10-CM | POA: Diagnosis not present

## 2021-06-28 DIAGNOSIS — R1031 Right lower quadrant pain: Secondary | ICD-10-CM

## 2021-06-28 DIAGNOSIS — Z01419 Encounter for gynecological examination (general) (routine) without abnormal findings: Secondary | ICD-10-CM | POA: Diagnosis not present

## 2021-06-28 NOTE — Progress Notes (Signed)
Subjective:        Phyllis Alexander is a 25 y.o. female here for a routine exam.  Current complaints: Lower right sided abdominal pain that comes off and on and is severe, associated with N/V and diarrhea..  Also c/o vaginal discharge.  Personal health questionnaire:  Is patient Ashkenazi Jewish, have a family history of breast and/or ovarian cancer: no Is there a family history of uterine cancer diagnosed at age < 62, gastrointestinal cancer, urinary tract cancer, family member who is a Personnel officer syndrome-associated carrier: no Is the patient overweight and hypertensive, family history of diabetes, personal history of gestational diabetes, preeclampsia or PCOS: no Is patient over 65, have PCOS,  family history of premature CHD under age 86, diabetes, smoke, have hypertension or peripheral artery disease:  no At any time, has a partner hit, kicked or otherwise hurt or frightened you?: no Over the past 2 weeks, have you felt down, depressed or hopeless?: no Over the past 2 weeks, have you felt little interest or pleasure in doing things?:no   Gynecologic History Patient's last menstrual period was 05/19/2021. Contraception: Depo-Provera injections Last Pap: 2019. Results were: normal Last mammogram: n/a. Results were: n/a  Obstetric History OB History  Gravida Para Term Preterm AB Living  4 3 2  0 1 2  SAB IAB Ectopic Multiple Live Births  0 1 0 1 4    # Outcome Date GA Lbr Len/2nd Weight Sex Delivery Anes PTL Lv  4 Term 03/14/20 [redacted]w[redacted]d 02:46 / 00:15 4 lb 12 oz (2.155 kg) F VBAC EPI  LIV     Birth Comments: WNL  3A Para 10/22/18 [redacted]w[redacted]d 01:40 / 00:55 10 oz (0.283 kg) F Vag-Spont EPI  ND  3B Para 10/22/18 [redacted]w[redacted]d 01:40 / 01:13 10.2 oz (0.289 kg) F Vag-Spont EPI  ND  2 IAB           1 Term      CS-LVertical   LIV    Obstetric Comments  Previable twins delivery with PPROM    Past Medical History:  Diagnosis Date   Encounter for supervision of normal pregnancy, unspecified, unspecified  trimester 11/11/2019    Nursing Staff Provider Office Location  [facility] Dating  LMP  Language  English Anatomy 11/13/2019   f/u in 4 weeks  Flu Vaccine  Declined 11/11/19 Genetic Screen  NIPS: low risks AFP: 11/11/2019  neg  TDaP vaccine   Given 01/13/20 Hgb A1C or  GTT Early  Third trimester nl 2 hour Rhogam  NA   LAB RESULTS  Feeding Plan Breast Blood Type --/--/A POS (05/03 0024)  Contraception Depo Antibody NEG (05/03 0024) Ci   Medical history non-contributory    Pregnancy affected by fetal growth restriction 03/09/2020   Previous cesarean delivery affecting pregnancy, antepartum 11/11/2019   Desires TOLAC   VBAC (vaginal birth after Cesarean) 03/14/2020    Past Surgical History:  Procedure Laterality Date   CESAREAN SECTION     DILATION AND CURETTAGE OF UTERUS     WISDOM TOOTH EXTRACTION       Current Outpatient Medications:    medroxyPROGESTERone (DEPO-PROVERA) 150 MG/ML injection, Inject 1 mL (150 mg total) into the muscle every 3 (three) months., Disp: 1 mL, Rfl: 0   silver sulfADIAZINE (SILVADENE) 1 % cream, Apply 1 application topically daily., Disp: 50 g, Rfl: 0   cephALEXin (KEFLEX) 500 MG capsule, Take 1 capsule (500 mg total) by mouth 3 (three) times daily. (Patient not taking: Reported on 08/23/2020), Disp: 21  capsule, Rfl: 0   medroxyPROGESTERone (DEPO-PROVERA) 150 MG/ML injection, Inject 1 mL (150 mg total) into the muscle every 3 (three) months., Disp: 1 mL, Rfl: 3   naproxen (NAPROSYN) 500 MG tablet, Take 1 tablet (500 mg total) by mouth 2 (two) times daily as needed for moderate pain. (Patient not taking: Reported on 08/02/2020), Disp: 15 tablet, Rfl: 0   ondansetron (ZOFRAN) 4 MG tablet, Take 1 tablet (4 mg total) by mouth every 8 (eight) hours as needed for nausea or vomiting., Disp: 10 tablet, Rfl: 0 No Known Allergies  Social History   Tobacco Use   Smoking status: Former    Packs/day: 0.50    Types: Cigarettes    Quit date: 08/15/2018    Years since quitting: 2.8   Smokeless  tobacco: Never  Substance Use Topics   Alcohol use: Yes    Alcohol/week: 1.0 standard drink    Types: 1 Shots of liquor per week    Family History  Problem Relation Age of Onset   Diabetes Other    ADD / ADHD Brother    Hypertension Maternal Aunt    Arthritis Maternal Grandmother    Diabetes Maternal Grandmother    Hypertension Maternal Grandmother    Stroke Maternal Grandmother    Arthritis Paternal Grandmother       Review of Systems  Constitutional: negative for fatigue and weight loss Respiratory: negative for cough and wheezing Cardiovascular: negative for chest pain, fatigue and palpitations Gastrointestinal: negative for abdominal pain and change in bowel habits Musculoskeletal:negative for myalgias Neurological: negative for gait problems and tremors Behavioral/Psych: negative for abusive relationship, depression Endocrine: negative for temperature intolerance    Genitourinary: positive for right lower abdominal pain and vaginal discharge.  negative for abnormal menstrual periods, genital lesions, hot flashes, sexual problems  Integument/breast: negative for breast lump, breast tenderness, nipple discharge and skin lesion(s)    Objective:       BP 125/84   Pulse (!) 103   Ht 5\' 8"  (1.727 m)   Wt 134 lb 4.8 oz (60.9 kg)   LMP 05/19/2021   BMI 20.42 kg/m  General:   Alert and no distress  Skin:   no rash or abnormalities  Lungs:   clear to auscultation bilaterally  Heart:   regular rate and rhythm, S1, S2 normal, no murmur, click, rub or gallop  Breasts:   normal without suspicious masses, skin or nipple changes or axillary nodes  Abdomen:  normal findings: no organomegaly, soft, non-tender and no hernia  Pelvis:  External genitalia: normal general appearance Urinary system: urethral meatus normal and bladder without fullness, nontender Vaginal: normal without tenderness, induration or masses Cervix: normal appearance Adnexa: normal bimanual exam Uterus:  anteverted and non-tender, normal size   Lab Review Urine pregnancy test Labs reviewed yes Radiologic studies reviewed no  I have spent a total of 20 minutes of face-to-face time, excluding clinical staff time, reviewing notes and preparing to see patient, ordering tests and/or medications, and counseling the patient.   Assessment:    1. Encounter for routine gynecological examination with Papanicolaou smear of cervix Rx: - Cytology - PAP( [facility])  2. Vaginal discharge Rx: - Cervicovaginal ancillary only( [place])  3. Screening for STD (sexually transmitted disease) Rx: - Hepatitis B surface antigen - Hepatitis C antibody - HIV Antibody (routine testing w rflx) - RPR  4. Right lower quadrant abdominal pain Rx: - 07/20/2021 PELVIC COMPLETE WITH TRANSVAGINAL; Future - US Abdomen Complete; Future  Plan:    Education reviewed: calcium supplements, depression evaluation, low fat, low cholesterol diet, safe sex/STD prevention, self breast exams, and weight bearing exercise. Contraception: Depo-Provera injections. Follow up in: 1 year.      Brock Bad, MD 06/28/2021 10:35 AM

## 2021-06-28 NOTE — Progress Notes (Signed)
Pt presents for annual, pap, and all STD testing.  Depo due 08/21/21

## 2021-06-29 LAB — HEPATITIS B SURFACE ANTIGEN: Hepatitis B Surface Ag: NEGATIVE

## 2021-06-29 LAB — CYTOLOGY - PAP: Diagnosis: NEGATIVE

## 2021-06-29 LAB — RPR: RPR Ser Ql: NONREACTIVE

## 2021-06-29 LAB — HEPATITIS C ANTIBODY: Hep C Virus Ab: <0.1 s/co ratio (ref 0.0–0.9)

## 2021-06-29 LAB — HIV ANTIBODY (ROUTINE TESTING W REFLEX): HIV Screen 4th Generation wRfx: NONREACTIVE

## 2021-06-30 LAB — CERVICOVAGINAL ANCILLARY ONLY
Bacterial Vaginitis (gardnerella): POSITIVE — AB
Candida Glabrata: NEGATIVE
Candida Vaginitis: NEGATIVE
Chlamydia: NEGATIVE
Comment: NEGATIVE
Comment: NEGATIVE
Comment: NEGATIVE
Comment: NEGATIVE
Comment: NEGATIVE
Comment: NORMAL
Neisseria Gonorrhea: NEGATIVE
Trichomonas: NEGATIVE

## 2021-07-03 ENCOUNTER — Other Ambulatory Visit: Payer: Self-pay | Admitting: Obstetrics

## 2021-07-03 DIAGNOSIS — B9689 Other specified bacterial agents as the cause of diseases classified elsewhere: Secondary | ICD-10-CM

## 2021-07-03 DIAGNOSIS — N760 Acute vaginitis: Secondary | ICD-10-CM

## 2021-07-03 MED ORDER — METRONIDAZOLE 500 MG PO TABS: 500.0000 mg | ORAL_TABLET | Freq: Two times a day (BID) | ORAL | 2 refills | Status: DC

## 2021-07-04 ENCOUNTER — Other Ambulatory Visit: Payer: Self-pay

## 2021-07-04 ENCOUNTER — Ambulatory Visit (HOSPITAL_BASED_OUTPATIENT_CLINIC_OR_DEPARTMENT_OTHER)
Admission: RE | Admit: 2021-07-04 | Discharge: 2021-07-04 | Disposition: A | Discharge status: Home / Self Care | Payer: Medicaid Other | Source: Ambulatory Visit | Attending: Obstetrics | Admitting: Obstetrics

## 2021-07-04 DIAGNOSIS — R1031 Right lower quadrant pain: Secondary | ICD-10-CM

## 2021-07-12 ENCOUNTER — Telehealth (INDEPENDENT_AMBULATORY_CARE_PROVIDER_SITE_OTHER): Payer: Medicaid Other | Admitting: Obstetrics

## 2021-07-12 ENCOUNTER — Encounter: Payer: Self-pay | Admitting: Obstetrics

## 2021-07-12 DIAGNOSIS — R1031 Right lower quadrant pain: Secondary | ICD-10-CM | POA: Diagnosis not present

## 2021-07-12 DIAGNOSIS — Z712 Person consulting for explanation of examination or test findings: Secondary | ICD-10-CM

## 2021-07-12 NOTE — Progress Notes (Signed)
GYNECOLOGY VIRTUAL VISIT ENCOUNTER NOTE  [facility]   Patient location: Home  I connected with Phyllis Alexander on 07/12/21 at  8:35 AM EDT by MyChart Video Encounter and verified that I am speaking with the correct person using two identifiers.   I discussed the limitations, risks, security and privacy concerns of performing an evaluation and management service virtually and the availability of in person appointments. I also discussed with the patient that there may be a patient responsible charge related to this service. The patient expressed understanding and agreed to proceed.   History:  Phyllis Alexander is a 25 y.o. [phone] female being evaluated today for pelvic pain.  Ultrasound done.  Presents virtually today for results.  She denies any abnormal vaginal discharge, bleeding, pelvic pain or other concerns.       Past Medical History:  Diagnosis Date   Encounter for supervision of normal pregnancy, unspecified, unspecified trimester 11/11/2019    Nursing Staff Provider Office Location  [facility] Dating  LMP  Language  English Anatomy US   f/u in 4 weeks  Flu Vaccine  Declined 11/11/19 Genetic Screen  NIPS: low risks AFP: 11/11/2019  neg  TDaP vaccine   Given 01/13/20 Hgb A1C or  GTT Early  Third trimester nl 2 hour Rhogam  NA   LAB RESULTS  Feeding Plan Breast Blood Type --/--/A POS (05/03 0024)  Contraception Depo Antibody NEG (05/03 0024) Ci   Medical history non-contributory    Pregnancy affected by fetal growth restriction 03/09/2020   Previous cesarean delivery affecting pregnancy, antepartum 11/11/2019   Desires TOLAC   VBAC (vaginal birth after Cesarean) 03/14/2020   Past Surgical History:  Procedure Laterality Date   CESAREAN SECTION     DILATION AND CURETTAGE OF UTERUS     WISDOM TOOTH EXTRACTION     The following portions of the patient's history were reviewed and updated as appropriate: allergies, current medications, past  family history, past medical history, past social history, past surgical history and problem list.   Health Maintenance:  Normal pap and negative HRHPV on 06-28-2021.   Review of Systems:  Pertinent items noted in HPI and remainder of comprehensive ROS otherwise negative.  Physical Exam:   General:  Alert, oriented and cooperative. Patient appears to be in no acute distress.  Mental Status: Normal mood and affect. Normal behavior. Normal judgment and thought content.   Respiratory: Normal respiratory effort, no problems with respiration noted  Rest of physical exam deferred due to type of encounter  Labs and Imaging Results for orders placed or performed in visit on 06/28/21 (from the past 336 hour(s))  Cytology - PAP( [facility])   Collection Time: 06/28/21 10:37 AM  Result Value Ref Range   Adequacy      Satisfactory for evaluation; transformation zone component PRESENT.   Diagnosis      - Negative for intraepithelial lesion or malignancy (NILM)   Microorganisms Shift in flora suggestive of bacterial vaginosis   Cervicovaginal ancillary only( [place])   Collection Time: 06/28/21 10:37 AM  Result Value Ref Range   Neisseria Gonorrhea Negative    Chlamydia Negative    Trichomonas Negative    Bacterial Vaginitis (gardnerella) Positive (A)    Candida Vaginitis Negative    Candida Glabrata Negative    Comment Normal Reference [place] Chlamydia - Negative    Comment      Normal Reference Range Neisseria Gonorrhea - Negative   Comment  Normal Reference Range Bacterial Vaginosis - Negative   Comment Normal Reference Range Candida Species - Negative    Comment Normal Reference Range Candida Galbrata - Negative    Comment Normal Reference Range Trichomonas - Negative   Hepatitis B surface antigen   Collection Time: 06/28/21 11:00 AM  Result Value Ref Range   Hepatitis B Surface Ag Negative Negative  Hepatitis C antibody   Collection Time: 06/28/21 11:00 AM  Result Value  Ref Range   Hep C Virus Ab <0.1 0.0 - 0.9 s/co ratio  HIV Antibody (routine testing w rflx)   Collection Time: 06/28/21 11:00 AM  Result Value Ref Range   HIV Screen 4th Generation wRfx Non Reactive Non Reactive  RPR   Collection Time: 06/28/21 11:00 AM  Result Value Ref Range   RPR Ser Ql Non Reactive Non Reactive   US Abdomen Complete  Result Date: 07/04/2021 CLINICAL DATA:  Generalized abdominal and pelvic pain since the end of July EXAM: ABDOMEN ULTRASOUND COMPLETE COMPARISON:  CT abdomen and pelvis 07/21/2020 FINDINGS: Gallbladder: Normally distended without stones or wall thickening. No pericholecystic fluid or sonographic Murphy sign. Common bile duct: Diameter: 3 mm, normal Liver: Normal echogenicity without mass or nodularity. No intrahepatic biliary dilatation. Portal vein is patent on color Doppler imaging with normal direction of blood flow towards the liver. IVC: Normal appearance Pancreas: Normal appearance Spleen: Normal appearance, 4.7 cm length Right Kidney: Length: 9.9 cm. Normal morphology without mass or hydronephrosis. Left Kidney: Length: 10.7 cm. Normal morphology without mass or hydronephrosis. Abdominal aorta: Normal caliber Other findings: No free fluid IMPRESSION: Normal exam. Electronically Signed   By: Ulyses Southward M.D.   On: 07/04/2021 14:02   US PELVIC COMPLETE WITH TRANSVAGINAL  Result Date: 07/04/2021 CLINICAL DATA:  Pelvic pain and generalized abdominal pain since end of July, LMP 06/29/2021 EXAM: TRANSABDOMINAL AND TRANSVAGINAL ULTRASOUND OF PELVIS TECHNIQUE: Both transabdominal and transvaginal ultrasound examinations of the pelvis were performed. Transabdominal technique was performed for global imaging of the pelvis including uterus, ovaries, adnexal regions, and pelvic cul-de-sac. It was necessary to proceed with endovaginal exam following the transabdominal exam to visualize the uterus, endometrium, and ovaries. COMPARISON:  10/27/2020 FINDINGS: Uterus  Measurements: 5.6 x 3.5 x 3.8 cm = volume: 39 mL. Retroverted. Normal morphology without mass Endometrium Thickness: 4 mm.  No endometrial fluid or focal abnormality Right ovary Measurements: 2.9 x 1.3 x 1.7 cm = volume: 3.4 mL. Normal morphology without mass Left ovary Measurements: 4.7 x 1.5 x 1.2 cm = volume: 4.3 mL. Normal morphology without mass Other findings No free pelvic fluid.  No adnexal masses. IMPRESSION: Normal exam. Electronically Signed   By: Ulyses Southward M.D.   On: 07/04/2021 14:01       Assessment and Plan:     1. Right lower quadrant abdominal pain, resolved - ultrasound is WNL.s - will follow clinically       I discussed the assessment and treatment plan with the patient. The patient was provided an opportunity to ask questions and all were answered. The patient agreed with the plan and demonstrated an understanding of the instructions.   The patient was advised to call back or seek an in-person evaluation/go to the ED if the symptoms worsen or if the condition fails to improve as anticipated.  I have spent a total of 15 minutes of face-to-face time, excluding clinical staff time, reviewing notes and preparing to see patient, ordering tests and/or medications, and counseling the patient.  Coral Ceo, MD [facility], [facility]  07/12/21

## 2021-08-21 ENCOUNTER — Ambulatory Visit: Payer: 59

## 2022-01-18 ENCOUNTER — Emergency Department (HOSPITAL_COMMUNITY): Payer: Medicaid Other

## 2022-01-18 ENCOUNTER — Emergency Department (HOSPITAL_COMMUNITY)
Admission: EM | Admit: 2022-01-18 | Discharge: 2022-01-18 | Disposition: A | Discharge status: Home / Self Care | Payer: Medicaid Other | Attending: Student | Admitting: Student

## 2022-01-18 ENCOUNTER — Other Ambulatory Visit: Payer: Self-pay

## 2022-01-18 ENCOUNTER — Encounter (HOSPITAL_COMMUNITY): Payer: Self-pay | Admitting: *Deleted

## 2022-01-18 DIAGNOSIS — R197 Diarrhea, unspecified: Secondary | ICD-10-CM | POA: Insufficient documentation

## 2022-01-18 DIAGNOSIS — R112 Nausea with vomiting, unspecified: Secondary | ICD-10-CM | POA: Insufficient documentation

## 2022-01-18 DIAGNOSIS — R1031 Right lower quadrant pain: Secondary | ICD-10-CM | POA: Insufficient documentation

## 2022-01-18 LAB — COMPREHENSIVE METABOLIC PANEL
ALT: 16 U/L (ref 0–44)
AST: 29 U/L (ref 15–41)
Albumin: 4.3 g/dL (ref 3.5–5.0)
Alkaline Phosphatase: 34 U/L — ABNORMAL LOW (ref 38–126)
Anion gap: 8 (ref 5–15)
BUN: 9 mg/dL (ref 6–20)
CO2: 22 mmol/L (ref 22–32)
Calcium: 8.9 mg/dL (ref 8.9–10.3)
Chloride: 106 mmol/L (ref 98–111)
Creatinine, Ser: 0.92 mg/dL (ref 0.44–1.00)
GFR, Estimated: >60 mL/min (ref >60)
Glucose, Bld: 95 mg/dL (ref 70–99)
Potassium: 3.9 mmol/L (ref 3.5–5.1)
Sodium: 136 mmol/L (ref 135–145)
Total Bilirubin: 0.4 mg/dL (ref 0.3–1.2)
Total Protein: 7.8 g/dL (ref 6.5–8.1)

## 2022-01-18 LAB — I-STAT BETA HCG BLOOD, ED (MC, WL, AP ONLY): I-stat hCG, quantitative: <5 m[IU]/mL (ref <5)

## 2022-01-18 LAB — URINALYSIS, ROUTINE W REFLEX MICROSCOPIC
Bilirubin Urine: NEGATIVE
Glucose, UA: NEGATIVE mg/dL
Hgb urine dipstick: NEGATIVE
Ketones, ur: NEGATIVE mg/dL
Leukocytes,Ua: NEGATIVE
Nitrite: NEGATIVE
Protein, ur: NEGATIVE mg/dL
Specific Gravity, Urine: 1.023 (ref 1.005–1.030)
pH: 8 (ref 5.0–8.0)

## 2022-01-18 LAB — CBC
HCT: 41.8 % (ref 36.0–46.0)
Hemoglobin: 14 g/dL (ref 12.0–15.0)
MCH: 31 pg (ref 26.0–34.0)
MCHC: 33.5 g/dL (ref 30.0–36.0)
MCV: 92.7 fL (ref 80.0–100.0)
Platelets: 278 10*3/uL (ref 150–400)
RBC: 4.51 MIL/uL (ref 3.87–5.11)
RDW: 14.9 % (ref 11.5–15.5)
WBC: 10.2 10*3/uL (ref 4.0–10.5)
nRBC: 0 % (ref 0.0–0.2)

## 2022-01-18 LAB — LIPASE, BLOOD: Lipase: 29 U/L (ref 11–51)

## 2022-01-18 MED ORDER — DICYCLOMINE HCL 20 MG PO TABS: 20.0000 mg | ORAL_TABLET | Freq: Two times a day (BID) | ORAL | 0 refills | Status: DC

## 2022-01-18 MED ORDER — ONDANSETRON 4 MG PO TBDP
4.0000 mg | ORAL_TABLET | Freq: Once | ORAL | Status: AC
  Administered 2022-01-18: 16:00:00 4 mg via ORAL
  Filled 2022-01-18: qty 1

## 2022-01-18 MED ORDER — ONDANSETRON HCL 4 MG PO TABS: 4.0000 mg | ORAL_TABLET | Freq: Four times a day (QID) | ORAL | 0 refills | Status: DC

## 2022-01-18 MED ORDER — OXYCODONE-ACETAMINOPHEN 5-325 MG PO TABS
1.0000 | ORAL_TABLET | Freq: Once | ORAL | Status: AC
  Administered 2022-01-18: 16:00:00 1 via ORAL
  Filled 2022-01-18: qty 1

## 2022-01-18 MED ORDER — IOHEXOL 300 MG/ML  SOLN
100.0000 mL | Freq: Once | INTRAMUSCULAR | Status: AC | PRN
  Administered 2022-01-18: 17:00:00 100 mL via INTRAVENOUS

## 2022-01-18 NOTE — ED Provider Notes (Signed)
[facility]-EMERGENCY DEPT Provider Note   CSN: [id number] Arrival date & time: 01/18/22  1249     History  Chief Complaint  Patient presents with   Abdominal Pain    Phyllis Alexander is a 26 y.o. female Pt complains of RLQ abdominal pain acute onset since this morning. Endorses currently on menstrual cycle. Has had similar pain in the past, has been evaluated recently in OB/GYN, had an ultrasound which did not show anything.  No history of ovarian torsion, ectopic pregnancy.  Patient reports that her menstrual cycle does not feel significantly different than previous.  She reports that she is having nausea, vomiting, diarrhea with abdominal pain.  She does have a history of marijuana hyperemesis in the past.   Abdominal Pain     Home Medications Prior to Admission medications   Medication Sig Start Date End Date Taking? Authorizing Provider  dicyclomine (BENTYL) 20 MG tablet Take 1 tablet (20 mg total) by mouth 2 (two) times daily. 01/18/22  Yes Bryer Gottsch H, PA-C  ondansetron (ZOFRAN) 4 MG tablet Take 1 tablet (4 mg total) by mouth every 6 (six) hours. 01/18/22  Yes Yemariam Magar H, PA-C  cephALEXin (KEFLEX) 500 MG capsule Take 1 capsule (500 mg total) by mouth 3 (three) times daily. Patient not taking: Reported on 08/23/2020 07/21/20   Petrucelli, Pleas Koch, PA-C  medroxyPROGESTERone (DEPO-PROVERA) 150 MG/ML injection Inject 1 mL (150 mg total) into the muscle every 3 (three) months. 08/23/20   Nugent, Odie Sera, NP  medroxyPROGESTERone (DEPO-PROVERA) 150 MG/ML injection Inject 1 mL (150 mg total) into the muscle every 3 (three) months. 02/08/21   Adam Phenix, MD  metroNIDAZOLE (FLAGYL) 500 MG tablet Take 1 tablet (500 mg total) by mouth 2 (two) times daily. 07/03/21   Brock Bad, MD  naproxen (NAPROSYN) 500 MG tablet Take 1 tablet (500 mg total) by mouth 2 (two) times daily as needed for moderate pain. Patient not taking: Reported on 08/02/2020  07/21/20   Petrucelli, Lelon Mast R, PA-C  silver sulfADIAZINE (SILVADENE) 1 % cream Apply 1 application topically daily. 06/06/21   Gustavus Bryant, FNP      Allergies    Patient has no known allergies.    Review of Systems   Review of Systems  Gastrointestinal:  Positive for abdominal pain.  All other systems reviewed and are negative.  Physical Exam Updated Vital Signs BP 110/60    Pulse 80    Temp 98 F (36.7 C)    Resp 16    Ht 5\' 8"  (1.727 m)    Wt 66.2 kg    SpO2 99%    BMI 22.20 kg/m  Physical Exam Vitals and nursing note reviewed.  Constitutional:      General: She is not in acute distress.    Appearance: Normal appearance.  HENT:     Head: Normocephalic and atraumatic.  Eyes:     General:        Right eye: No discharge.        Left eye: No discharge.  Cardiovascular:     Rate and Rhythm: Normal rate and regular rhythm.     Heart sounds: No murmur heard.   No friction rub. No gallop.  Pulmonary:     Effort: Pulmonary effort is normal.     Breath sounds: Normal breath sounds.  Abdominal:     General: Bowel sounds are normal.     Palpations: Abdomen is soft.     Comments: Patient  with some tenderness to palpation in the right lower quadrant without rebound, rigidity, guarding.  Normal bowel sounds throughout.  No distention noted, no ecchymosis noted on skin surface.  Skin:    General: Skin is warm and dry.     Capillary Refill: Capillary refill takes less than 2 seconds.  Neurological:     Mental Status: She is alert and oriented to person, place, and time.  Psychiatric:        Mood and Affect: Mood normal.        Behavior: Behavior normal.    ED Results / Procedures / Treatments   Labs (all labs ordered are listed, but only abnormal results are displayed) Labs Reviewed  COMPREHENSIVE METABOLIC PANEL - Abnormal; Notable for the following components:      Result Value   Alkaline Phosphatase 34 (*)    All other components within normal limits  URINALYSIS,  ROUTINE W REFLEX MICROSCOPIC - Abnormal; Notable for the following components:   APPearance CLOUDY (*)    All other components within normal limits  LIPASE, BLOOD  CBC  I-STAT BETA HCG BLOOD, ED ([facility], [facility], [facility] ONLY)    EKG None  Radiology CT ABDOMEN PELVIS W CONTRAST  Result Date: 01/18/2022 CLINICAL DATA:  Right lower quadrant pain EXAM: CT ABDOMEN AND PELVIS WITH CONTRAST TECHNIQUE: Multidetector CT imaging of the abdomen and pelvis was performed using the standard protocol following bolus administration of intravenous contrast. RADIATION DOSE REDUCTION: This exam was performed according to the departmental dose-optimization program which includes automated exposure control, adjustment of the mA and/or kV according to patient size and/or use of iterative reconstruction technique. CONTRAST:  100mL OMNIPAQUE IOHEXOL 300 MG/ML  SOLN COMPARISON:  CT abdomen and pelvis 07/21/2020 FINDINGS: Lower chest: No acute abnormality. Hepatobiliary: Liver is upper normal size with normal contour. No suspicious hepatic mass identified. Mild hypodensity adjacent to the falciform ligament likely represents focal fatty infiltration. Gallbladder appears normal. No biliary ductal dilatation. Pancreas: Unremarkable. No pancreatic ductal dilatation or surrounding inflammatory changes. Spleen: Normal in size without focal abnormality. Adrenals/Urinary Tract: Adrenal glands are unremarkable. Kidneys are normal, without renal calculi, focal lesion, or hydronephrosis. Bladder is unremarkable. Stomach/Bowel: No bowel obstruction, free air or pneumatosis. No bowel wall edema. Appendix is normal. Vascular/Lymphatic: No significant vascular findings are present. No enlarged abdominal or pelvic lymph nodes. Reproductive: Uterus and bilateral adnexa are unremarkable. Trace free fluid in the pelvis most likely physiologic. 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 Other: No abdominal wall hernia or abnormality. No abdominopelvic  ascites. Musculoskeletal: No acute or significant osseous findings. IMPRESSION: No acute process identified.  Other chronic findings as described. Electronically Signed   By: Jannifer Hickelaney  Williams M.D.   On: 01/18/2022 16:54    Procedures Procedures    Medications Ordered in ED Medications  ondansetron (ZOFRAN-ODT) disintegrating tablet 4 mg (4 mg Oral Given 01/18/22 1543)  oxyCODONE-acetaminophen (PERCOCET/ROXICET) 5-325 MG per tablet 1 tablet (1 tablet Oral Given 01/18/22 1543)  iohexol (OMNIPAQUE) 300 MG/ML solution 100 mL (100 mLs Intravenous Contrast Given 01/18/22 1632)    ED Course/ Medical Decision Making/ A&P                           Medical Decision Making Amount and/or Complexity of Data Reviewed Labs: ordered. Radiology: ordered.  Risk Prescription drug management.   This patient presents to the ED for concern of right lower quadrant pain, nausea, vomiting, diarrhea for the last 3 hours  this involves an extensive number of treatment options, and is a complaint that carries with it a high risk of complications and morbidity. The emergent differential diagnosis prior to evaluation includes, but is not limited to, ovarian torsion, ruptured ectopic pregnancy, appendicitis, mesenteric ischemia, right-sided diverticulitis, gastroenteritis, versus other.  Additionally considered UTI, pyelonephritis, nephrolithiasis.   This is not an exhaustive differential.   Past Medical History / Co-morbidities / Social History: Overall noncontributory medical history, patient with previous evaluation for marijuana hyperemesis  Additional history: External records from outside source obtained and reviewed including outpatient OB/GYN.  Physical Exam: Physical exam performed. The pertinent findings include: Some tenderness to palpation in the right lower quadrant without significant rebound, rigidity, guarding.  Patient is with significant improvement of abdominal pain on reexamination.  Lab  Tests: I ordered, and personally interpreted labs.  The pertinent results include: Unremarkable urinalysis, normal lipase, unremarkable CBC, CMP with slightly decreased alkaline phosphatase, with no clear clinical relevance.  Negative beta-hCG.  Patient is not pregnant.   Imaging Studies: I ordered imaging studies including CT abdomen pelvis. I independently visualized and interpreted imaging which showed no evidence of acute intra-abdominal abnormality. I agree with the radiologist interpretation.  Medications: I ordered medication including Percocet for pain, Zofran for nausea. Reevaluation of the patient after these medicines showed that the patient improved. I have reviewed the patients home medicines and have made adjustments as needed.   Disposition: After consideration of the diagnostic results and the patients response to treatment, I feel that patient's presentation is consistent with either intermittent ovarian torsion versus gastroenteritis versus enteritis versus other nonspecific abdominal pain, irritable bowel syndrome.  Her lab work is unremarkable.  Considered admission for this patient, do not believe it is clinically indicated at this time as she is afebrile with overall stable vital signs, few isolated episodes of bradycardia which may be spurious.  We will discharge patient at this time with prescription for Bentyl, Zofran, encouraged follow-up with PCP, OB/GYN.  Patient understands agrees to plan, discharged in stable condition at this time. Final Clinical Impression(s) / ED Diagnoses Final diagnoses:  Right lower quadrant abdominal pain    Rx / DC Orders ED Discharge Orders          Ordered    ondansetron (ZOFRAN) 4 MG tablet  Every 6 hours        01/18/22 1719    dicyclomine (BENTYL) 20 MG tablet  2 times daily        01/18/22 1719              Messiah Ahr, [place] H, PA-C 01/18/22 1727    Arby Barrette, MD 01/19/22 1511

## 2022-01-18 NOTE — ED Notes (Signed)
Unsuccessful lab collection attempt  ?

## 2022-01-18 NOTE — ED Provider Triage Note (Signed)
Emergency Medicine Provider Triage Evaluation Note ? ?Phyllis Alexander , a 26 y.o. female  was evaluated in triage.  Pt complains of RLQ abdominal pain acute onset since this morning. Endorses currently on menstrual cycle. Has had similar pain in the past, has been evaluated recently in OB/GYN, had an ultrasound which did not show anything.  No history of ovarian torsion, ectopic pregnancy.  Patient reports that her menstrual cycle does not feel significantly different than previous.  She reports that she is having nausea, vomiting, diarrhea with abdominal pain.  She does have a history of marijuana hyperemesis in the past. ? ?Review of Systems  ?Positive: Abd pain, NVD ?Negative: Chest pain, shob ? ?Physical Exam  ?BP 109/81 (BP Location: Right Arm)   Pulse 65   Temp 97.9 ?F (36.6 ?C) (Oral)   Resp 18   Ht 5\' 8"  (1.727 m)   Wt 66.2 kg   SpO2 100%   BMI 22.20 kg/m?  ?Gen:   Awake, some distress ?Resp:  Normal effort  ?MSK:   Moves extremities without difficulty  ?Other:  TTP throughout abdomen, worst in RLQ, no rebound, rigidity, guarding ? ?Medical Decision Making  ?Medically screening exam initiated at 1:20 PM.  Appropriate orders placed.  Phyllis Alexander was informed that the remainder of the evaluation will be completed by another provider, this initial triage assessment does not replace that evaluation, and the importance of remaining in the ED until their evaluation is complete. ? ?Workup initiated ?  ?Phyllis Sciara, PA-C ?01/18/22 1321 ? ?

## 2022-01-18 NOTE — Discharge Instructions (Signed)
Please use Tylenol or ibuprofen for pain.  You may use 600 mg ibuprofen every 6 hours or 1000 mg of Tylenol every 6 hours.  You may choose to alternate between the 2.  This would be most effective.  Not to exceed 4 g of Tylenol within 24 hours.  Not to exceed 3200 mg ibuprofen 24 hours. ? ?You can take the Bentyl I am prescribing as well as use Zofran for pain, nausea.  I recommend that you continue to follow-up with OB/GYN as you have planned already.  If you have significant worsening pain please return to the emergency department.  You may want to stick to a gentle diet for the next few days to help with any stomach pain. ?

## 2022-01-18 NOTE — ED Triage Notes (Signed)
Pt report RLQ pain x 3 hours with N/V/D. Verbally distressed in triage due to pain ?

## 2022-02-28 ENCOUNTER — Encounter (HOSPITAL_COMMUNITY): Payer: Self-pay | Admitting: Emergency Medicine

## 2022-02-28 ENCOUNTER — Ambulatory Visit (HOSPITAL_COMMUNITY)
Admission: EM | Admit: 2022-02-28 | Discharge: 2022-02-28 | Disposition: A | Discharge status: Home / Self Care | Payer: BC Managed Care – PPO | Attending: Internal Medicine | Admitting: Internal Medicine

## 2022-02-28 ENCOUNTER — Ambulatory Visit (INDEPENDENT_AMBULATORY_CARE_PROVIDER_SITE_OTHER): Payer: BC Managed Care – PPO

## 2022-02-28 DIAGNOSIS — M79651 Pain in right thigh: Secondary | ICD-10-CM | POA: Diagnosis not present

## 2022-02-28 DIAGNOSIS — M25551 Pain in right hip: Secondary | ICD-10-CM | POA: Diagnosis not present

## 2022-02-28 MED ORDER — MELOXICAM 15 MG PO TABS: 15.0000 mg | ORAL_TABLET | Freq: Every day | ORAL | 0 refills | Status: DC | PRN

## 2022-02-28 NOTE — ED Provider Notes (Signed)
?[facility] ? ? ? ?CSN: [id number] ?Arrival date & time: 02/28/22  1050 ? ? ?  ? ?History   ?Chief Complaint ?Chief Complaint  ?Patient presents with  ? Leg Pain  ? ? ?HPI ?Phyllis Alexander is a 26 y.o. female.  ? ?Right Hip/Thigh Pain ?3 days ?Insidious onset, no injury ?Walks a lot at work and thinks that she has been walking more ?Has came and gone over the last few days ?Reports was limping today from pain so decided to come in ?Worse with walking and stretching of the area ?Has been urinating normally and otherwise feeling well ?No N/T ?No back pain ?Hasn't tried anything for the pain ?LMP 4/6, denies possibility of pregnancy ?Otherwise in usual state of health ? ? ? ?Past Medical History:  ?Diagnosis Date  ? Encounter for supervision of normal pregnancy, unspecified, unspecified trimester 11/11/2019  ?  Nursing Staff Provider Office Location  [facility] Dating  LMP  Language  English Anatomy US   f/u in 4 weeks  Flu Vaccine  Declined 11/11/19 Genetic Screen  NIPS: low risks AFP: 11/11/2019  neg  TDaP vaccine   Given 01/13/20 Hgb A1C or  GTT Early  Third trimester nl 2 hour Rhogam  NA   LAB RESULTS  Feeding Plan Breast Blood Type --/--/A POS (05/03 0024)  Contraception Depo Antibody NEG (05/03 0024) Ci  ? Medical history non-contributory   ? Pregnancy affected by fetal growth restriction 03/09/2020  ? Previous cesarean delivery affecting pregnancy, antepartum 11/11/2019  ? Desires TOLAC  ? VBAC (vaginal birth after Cesarean) 03/14/2020  ? ? ?Patient Active Problem List  ? Diagnosis Date Noted  ? Chlamydia 08/02/2020  ? History of pregnancy complication 11/11/2019  ? ? ?Past Surgical History:  ?Procedure Laterality Date  ? CESAREAN SECTION    ? DILATION AND CURETTAGE OF UTERUS    ? WISDOM TOOTH EXTRACTION    ? ? ?OB History   ? ? Gravida  ?4  ? Para  ?3  ? Term  ?2  ? Preterm  ?0  ? AB  ?1  ? Living  ?2  ?  ? ? SAB  ?0  ? IAB  ?1  ? Ectopic  ?0  ? Multiple  ?1  ? Live Births  ?4  ?   ?  ? Obstetric Comments   ?Previable twins delivery with PPROM  ?  ? ?  ? ? ? ?Home Medications   ? ?Prior to Admission medications   ?Medication Sig Start Date End Date Taking? Authorizing Provider  ?meloxicam (MOBIC) 15 MG tablet Take 1 tablet (15 mg total) by mouth daily as needed for pain. 02/28/22  Yes Nolen Lindamood, Solmon Ice, DO  ?cephALEXin (KEFLEX) 500 MG capsule Take 1 capsule (500 mg total) by mouth 3 (three) times daily. ?Patient not taking: Reported on 08/23/2020 07/21/20   Petrucelli, Pleas Koch, PA-C  ?dicyclomine (BENTYL) 20 MG tablet Take 1 tablet (20 mg total) by mouth 2 (two) times daily. 01/18/22   Prosperi, Christian H, PA-C  ?medroxyPROGESTERone (DEPO-PROVERA) 150 MG/ML injection Inject 1 mL (150 mg total) into the muscle every 3 (three) months. 08/23/20   Nugent, Odie Sera, NP  ?medroxyPROGESTERone (DEPO-PROVERA) 150 MG/ML injection Inject 1 mL (150 mg total) into the muscle every 3 (three) months. 02/08/21   Adam Phenix, MD  ?metroNIDAZOLE (FLAGYL) 500 MG tablet Take 1 tablet (500 mg total) by mouth 2 (two) times daily. 07/03/21   Brock Bad, MD  ?ondansetron [facility]) 4  MG tablet Take 1 tablet (4 mg total) by mouth every 6 (six) hours. 01/18/22   Prosperi, Christian H, PA-C  ?silver sulfADIAZINE (SILVADENE) 1 % cream Apply 1 application topically daily. 06/06/21   Gustavus BryantMound, Haley E, FNP  ? ? ?Family History ?Family History  ?Problem Relation Age of Onset  ? Diabetes Other   ? ADD / ADHD Brother   ? Hypertension Maternal Aunt   ? Arthritis Maternal Grandmother   ? Diabetes Maternal Grandmother   ? Hypertension Maternal Grandmother   ? Stroke Maternal Grandmother   ? Arthritis Paternal Grandmother   ? ? ?Social History ?Social History  ? ?Tobacco Use  ? Smoking status: Former  ?  Packs/day: 0.50  ?  Types: Cigarettes  ?  Quit date: 08/15/2018  ?  Years since quitting: 3.5  ? Smokeless tobacco: Never  ?Vaping Use  ? Vaping Use: Never used  ?Substance Use Topics  ? Alcohol use: Yes  ?  Alcohol/week: 1.0 standard drink  ?   Types: 1 Shots of liquor per week  ? Drug use: Yes  ?  Types: Marijuana  ?  Comment: 01/17/22  ? ? ? ?Allergies   ?Patient has no known allergies. ? ? ?Review of Systems ?Review of Systems  ?All other systems reviewed and are negative. ? ?Per HPI ?Physical Exam ?Triage Vital Signs ?ED Triage Vitals  ?Enc Vitals Group  ?   BP 02/28/22 1141 107/70  ?   Pulse Rate 02/28/22 1141 69  ?   Resp 02/28/22 1141 18  ?   Temp 02/28/22 1141 99.5 ?F (37.5 ?C)  ?   Temp Source 02/28/22 1141 Oral  ?   SpO2 02/28/22 1141 98 %  ?   Weight --   ?   Height --   ?   Head Circumference --   ?   Peak Flow --   ?   Pain Score 02/28/22 1139 7  ?   Pain Loc --   ?   Pain Edu? --   ?   Excl. in GC? --   ? ?No data found. ? ?Updated Vital Signs ?BP 107/70   Pulse 69   Temp 99.5 ?F (37.5 ?C) (Oral)   Resp 18   LMP 02/10/2022   SpO2 98%   Breastfeeding No  ? ?Visual Acuity ?Right Eye Distance:   ?Left Eye Distance:   ?Bilateral Distance:   ? ?Right Eye Near:   ?Left Eye Near:    ?Bilateral Near:    ? ?Physical Exam ?Constitutional:   ?   General: She is not in acute distress. ?   Appearance: Normal appearance. She is not ill-appearing.  ?HENT:  ?   Head: Normocephalic and atraumatic.  ?Eyes:  ?   Conjunctiva/sclera: Conjunctivae normal.  ?Cardiovascular:  ?   Rate and Rhythm: Normal rate.  ?Pulmonary:  ?   Effort: Pulmonary effort is normal. No respiratory distress.  ?Abdominal:  ?   Palpations: Abdomen is soft.  ?   Tenderness: There is no abdominal tenderness. There is no guarding.  ?Musculoskeletal:  ?   Cervical back: Normal range of motion.  ?   Comments: Right Hip:  ?- Inspection: No gross deformity, no swelling, erythema, or ecchymosis b/l ?- Palpation: TTP over proximal hip flexors just distal to ASIS ?- ROM: Normal range of motion on Flexion, extension, abduction, internal and external rotation b/l ?- Strength: Normal strength in all fields b/l, pain with hip flexor testing ?- Neuro/vasc: NV intact distally  b/l ?- Special Tests:  Negative FABER and FADIR b/l, but some mild pain anteriorly with FABER.  Pain with Stinchfield.  Negative Hop test. ?Intermittently pain seems out of proportion to exam ?  ?Skin: ?   General: Skin is warm and dry.  ?Neurological:  ?   Mental Status: She is alert and oriented to person, place, and time.  ?Psychiatric:     ?   Mood and Affect: Mood normal.     ?   Behavior: Behavior normal.  ? ? ? ?UC Treatments / Results  ?Labs ?(all labs ordered are listed, but only abnormal results are displayed) ?Labs Reviewed - No data to display ? ?EKG ? ? ?Radiology ?DG Hip Unilat With Pelvis 2-3 Views Right ? ?Result Date: 02/28/2022 ?CLINICAL DATA:  Right thigh pain without known injury. EXAM: DG HIP (WITH OR WITHOUT PELVIS) 2-3V RIGHT COMPARISON:  None. FINDINGS: There is no evidence of hip fracture or dislocation. There is no evidence of arthropathy or other focal bone abnormality. IMPRESSION: Negative. Electronically Signed   By: Lupita Raider M.D.   On: 02/28/2022 12:22   ? ?Procedures ?Procedures (including critical care time) ? ?Medications Ordered in UC ?Medications - No data to display ? ?Initial Impression / Assessment and Plan / UC Course  ?I have reviewed the triage vital signs and the nursing notes. ? ?Pertinent labs & imaging results that were available during my care of the patient were reviewed by me and considered in my medical decision making (see chart for details). ? ?  ? ?XR negative for abnormality.  Cannot rule out stress fracture, but less likely given history and presentation.  Symptoms most consistent with hip flexor strain.  No evidence of intra-abdominal pathology on exam or history.  Rx provided for anti-inflammatory and advised to follow up with sports medicine in 1-2 weeks if not improving.  Advised to avoid strenuous activity over the next few days, such as running and jumping.  Given return precautions, see AVS. ? ? ?Final Clinical Impressions(s) / UC Diagnoses  ? ?Final diagnoses:  ?Right hip  pain  ? ? ? ?Discharge Instructions   ? ?  ?As we discussed, your x-ray was normal.  You likely have strained the muscles in your hip that help you bring your knee up (your hip flexors).  This can be from excessive wal

## 2022-02-28 NOTE — Discharge Instructions (Signed)
As we discussed, your x-ray was normal.  You likely have strained the muscles in your hip that help you bring your knee up (your hip flexors).  This can be from excessive walking.  It is still possible that you have a stress fracture in your hip, but this is very unlikely.  Avoid running and jumping for a week and while you are still having pain just to be safe.  This will also help the pain improve faster.  Take the medication that I have prescribed you.  You can take this once daily as needed for your pain.  Do not take this with ibuprofen, Advil, Aleve.  Heat can also be helpful on the area that is painful.  If your pain severely worsens, especially if you experience more pain in your abdomen, difficulty urinating, difficulty having a bowel movement, fever, abnormal vaginal bleeding, you should be seen by medical provider right away. ? ?If your hip pain does not improve over the next 1 to 2 weeks, you can call sports medicine for a follow-up appointment. ?

## 2022-02-28 NOTE — ED Triage Notes (Signed)
Pt  is present today with right thigh pain. Pt states that she noticed it Sunday. Pt denies any injury. Pt describes the pain as ' sharp".  ?

## 2022-11-07 ENCOUNTER — Ambulatory Visit (INDEPENDENT_AMBULATORY_CARE_PROVIDER_SITE_OTHER): Payer: BC Managed Care – PPO | Admitting: Obstetrics

## 2022-11-07 ENCOUNTER — Other Ambulatory Visit (HOSPITAL_COMMUNITY)
Admission: RE | Admit: 2022-11-07 | Discharge: 2022-11-07 | Disposition: A | Discharge status: Home / Self Care | Payer: BC Managed Care – PPO | Source: Ambulatory Visit | Attending: Obstetrics | Admitting: Obstetrics

## 2022-11-07 ENCOUNTER — Encounter: Payer: Self-pay | Admitting: Obstetrics

## 2022-11-07 VITALS — BP 106/72 | HR 89 | Ht 68.0 in | Wt 144.0 lb

## 2022-11-07 DIAGNOSIS — Z01419 Encounter for gynecological examination (general) (routine) without abnormal findings: Secondary | ICD-10-CM

## 2022-11-07 DIAGNOSIS — Z113 Encounter for screening for infections with a predominantly sexual mode of transmission: Secondary | ICD-10-CM | POA: Diagnosis not present

## 2022-11-07 DIAGNOSIS — N898 Other specified noninflammatory disorders of vagina: Secondary | ICD-10-CM

## 2022-11-07 DIAGNOSIS — Z3009 Encounter for other general counseling and advice on contraception: Secondary | ICD-10-CM

## 2022-11-07 DIAGNOSIS — Z30013 Encounter for initial prescription of injectable contraceptive: Secondary | ICD-10-CM | POA: Diagnosis not present

## 2022-11-07 LAB — POCT URINE PREGNANCY: Preg Test, Ur: NEGATIVE

## 2022-11-07 MED ORDER — MEDROXYPROGESTERONE ACETATE 150 MG/ML IM SUSP: 150.0000 mg | INTRAMUSCULAR | 4 refills | Status: DC

## 2022-11-07 MED ORDER — MEDROXYPROGESTERONE ACETATE 150 MG/ML IM SUSP: 150.0000 mg | Freq: Once | INTRAMUSCULAR | Status: DC

## 2022-11-07 NOTE — Progress Notes (Signed)
Subjective:        Phyllis Alexander is a 26 y.o. female here for a routine exam.  Current complaints: Vaginal discharge.  Had 2 periods this past month that were abnormal.  Personal health questionnaire:  Is patient Ashkenazi Jewish, have a family history of breast and/or ovarian cancer: no Is there a family history of uterine cancer diagnosed at age < 41, gastrointestinal cancer, urinary tract cancer, family member who is a Personnel officer syndrome-associated carrier: no Is the patient overweight and hypertensive, family history of diabetes, personal history of gestational diabetes, preeclampsia or PCOS: no Is patient over 75, have PCOS,  family history of premature CHD under age 39, diabetes, smoke, have hypertension or peripheral artery disease:  no At any time, has a partner hit, kicked or otherwise hurt or frightened you?: no Over the past 2 weeks, have you felt down, depressed or hopeless?: no Over the past 2 weeks, have you felt little interest or pleasure in doing things?:no   Gynecologic History Patient's last menstrual period was 10/14/2022. Contraception: condoms Last Pap: 2022. Results were: normal Last mammogram: n/a. Results were: jn/a  Obstetric History OB History  Gravida Para Term Preterm AB Living  4 3 2  0 1 2  SAB IAB Ectopic Multiple Live Births  0 1 0 1 4    # Outcome Date GA Lbr Len/2nd Weight Sex Delivery Anes PTL Lv  4 Term 03/14/20 [redacted]w[redacted]d 02:46 / 00:15 4 lb 12 oz (2.155 kg) F VBAC EPI  LIV     Birth Comments: WNL  3A Para 10/22/18 [redacted]w[redacted]d 01:40 / 00:55 10 oz (0.283 kg) F Vag-Spont EPI  ND  3B Para 10/22/18 [redacted]w[redacted]d 01:40 / 01:13 10.2 oz (0.289 kg) F Vag-Spont EPI  ND  2 IAB           1 Term      CS-LVertical   LIV    Obstetric Comments  Previable twins delivery with PPROM    Past Medical History:  Diagnosis Date   Encounter for supervision of normal pregnancy, unspecified, unspecified trimester 11/11/2019    Nursing Staff Provider Office Location  [facility] Dating   LMP  Language  English Anatomy 11/13/2019   f/u in 4 weeks  Flu Vaccine  Declined 11/11/19 Genetic Screen  NIPS: low risks AFP: 11/11/2019  neg  TDaP vaccine   Given 01/13/20 Hgb A1C or  GTT Early  Third trimester nl 2 hour Rhogam  NA   LAB RESULTS  Feeding Plan Breast Blood Type --/--/A POS (05/03 0024)  Contraception Depo Antibody NEG (05/03 0024) Ci   Medical history non-contributory    Pregnancy affected by fetal growth restriction 03/09/2020   Previous cesarean delivery affecting pregnancy, antepartum 11/11/2019   Desires TOLAC   VBAC (vaginal birth after Cesarean) 03/14/2020    Past Surgical History:  Procedure Laterality Date   CESAREAN SECTION     DILATION AND CURETTAGE OF UTERUS     WISDOM TOOTH EXTRACTION       Current Outpatient Medications:    medroxyPROGESTERone (DEPO-PROVERA) 150 MG/ML injection, Inject 1 mL (150 mg total) into the muscle every 3 (three) months., Disp: 1 mL, Rfl: 4   cephALEXin (KEFLEX) 500 MG capsule, Take 1 capsule (500 mg total) by mouth 3 (three) times daily. (Patient not taking: Reported on 08/23/2020), Disp: 21 capsule, Rfl: 0   dicyclomine (BENTYL) 20 MG tablet, Take 1 tablet (20 mg total) by mouth 2 (two) times daily., Disp: 20 tablet, Rfl: 0   medroxyPROGESTERone (  DEPO-PROVERA) 150 MG/ML injection, Inject 1 mL (150 mg total) into the muscle every 3 (three) months. (Patient not taking: Reported on 11/07/2022), Disp: 1 mL, Rfl: 3   medroxyPROGESTERone (DEPO-PROVERA) 150 MG/ML injection, Inject 1 mL (150 mg total) into the muscle every 3 (three) months., Disp: 1 mL, Rfl: 0   meloxicam (MOBIC) 15 MG tablet, Take 1 tablet (15 mg total) by mouth daily as needed for pain., Disp: 15 tablet, Rfl: 0   metroNIDAZOLE (FLAGYL) 500 MG tablet, Take 1 tablet (500 mg total) by mouth 2 (two) times daily., Disp: 14 tablet, Rfl: 2   ondansetron (ZOFRAN) 4 MG tablet, Take 1 tablet (4 mg total) by mouth every 6 (six) hours., Disp: 12 tablet, Rfl: 0   silver sulfADIAZINE (SILVADENE) 1 %  cream, Apply 1 application topically daily., Disp: 50 g, Rfl: 0  Current Facility-Administered Medications:    medroxyPROGESTERone (DEPO-PROVERA) injection 150 mg, 150 mg, Intramuscular, Once, Brock Bad, MD No Known Allergies  Social History   Tobacco Use   Smoking status: Former    Packs/day: 0.50    Types: Cigarettes    Quit date: 08/15/2018    Years since quitting: 4.2   Smokeless tobacco: Never  Substance Use Topics   Alcohol use: Yes    Alcohol/week: 1.0 standard drink of alcohol    Types: 1 Shots of liquor per week    Family History  Problem Relation Age of Onset   Diabetes Other    ADD / ADHD Brother    Hypertension Maternal Aunt    Arthritis Maternal Grandmother    Diabetes Maternal Grandmother    Hypertension Maternal Grandmother    Stroke Maternal Grandmother    Arthritis Paternal Grandmother       Review of Systems  Constitutional: negative for fatigue and weight loss Respiratory: negative for cough and wheezing Cardiovascular: negative for chest pain, fatigue and palpitations Gastrointestinal: negative for abdominal pain and change in bowel habits Musculoskeletal:negative for myalgias Neurological: negative for gait problems and tremors Behavioral/Psych: negative for abusive relationship, depression Endocrine: negative for temperature intolerance    Genitourinary:  positive for vaginal discharge.  negative for abnormal menstrual periods, genital lesions, hot flashes, sexual problems  Integument/breast: negative for breast lump, breast tenderness, nipple discharge and skin lesion(s)    Objective:       BP 106/72   Pulse 89   Ht 5\' 8"  (1.727 m)   Wt 144 lb (65.3 kg)   LMP 10/14/2022   BMI 21.90 kg/m  General:   Alert and no distress  Skin:   no rash or abnormalities  Lungs:   clear to auscultation bilaterally  Heart:   regular rate and rhythm, S1, S2 normal, no murmur, click, rub or gallop  Breasts:   normal without suspicious masses, skin  or nipple changes or axillary nodes  Abdomen:  normal findings: no organomegaly, soft, non-tender and no hernia  Pelvis:  External genitalia: normal general appearance Urinary system: urethral meatus normal and bladder without fullness, nontender Vaginal: normal without tenderness, induration or masses Cervix: normal appearance Adnexa: normal bimanual exam Uterus: anteverted and non-tender, normal size   Lab Review Urine pregnancy test Labs reviewed yes Radiologic studies reviewed yes  I have spent a total of 20 minutes of face-to-face and non-face-to-face time, excluding clinical staff time, reviewing notes and preparing to see patient, ordering tests and/or medications, and counseling the patient.   Assessment:    1. Encounter for routine gynecological examination with Papanicolaou smear of cervix  Rx: - Cytology - PAP( [facility])  2. Vaginal discharge Rx: - Cervicovaginal ancillary only( [place])  3. Screening for STD (sexually transmitted disease) RxL - RPR - HIV antibody (with reflex) - Hepatitis C Antibody  4. Encounter for other general counseling or advice on contraception - options discussed.  Wants Depo  5. Encounter for initial prescription of injectable contraceptive Rx: - POCT urine pregnancy - medroxyPROGESTERone (DEPO-PROVERA) injection 150 mg - medroxyPROGESTERone (DEPO-PROVERA) 150 MG/ML injection; Inject 1 mL (150 mg total) into the muscle every 3 (three) months.  Dispense: 1 mL; Refill: 4     Plan:    Education reviewed: calcium supplements, depression evaluation, low fat, low cholesterol diet, safe sex/STD prevention, self breast exams, and weight bearing exercise. Contraception: Depo-Provera injections. Follow up in: 2 months.   Meds ordered this encounter  Medications   medroxyPROGESTERone (DEPO-PROVERA) injection 150 mg   medroxyPROGESTERone (DEPO-PROVERA) 150 MG/ML injection    Sig: Inject 1 mL (150 mg total) into the muscle every 3  (three) months.    Dispense:  1 mL    Refill:  4   Orders Placed This Encounter  Procedures   RPR   HIV antibody (with reflex)   Hepatitis C Antibody   POCT urine pregnancy    Camillia Marcy A. Clearance Coots MD 11/07/2022

## 2022-11-07 NOTE — Progress Notes (Signed)
26 y.o GYN presents for AEX/PAP/STD screening.  Pt wants to start [facility].  She currently uses condoms. C/o having a period twice in December 3-10/21-present.  UPT Negative

## 2022-11-08 LAB — RPR: RPR Ser Ql: NONREACTIVE

## 2022-11-08 LAB — HIV ANTIBODY (ROUTINE TESTING W REFLEX): HIV Screen 4th Generation wRfx: NONREACTIVE

## 2022-11-08 LAB — HEPATITIS C ANTIBODY: Hep C Virus Ab: NONREACTIVE

## 2022-11-09 LAB — CERVICOVAGINAL ANCILLARY ONLY
Bacterial Vaginitis (gardnerella): POSITIVE — AB
Candida Glabrata: NEGATIVE
Candida Vaginitis: NEGATIVE
Chlamydia: NEGATIVE
Comment: NEGATIVE
Comment: NEGATIVE
Comment: NEGATIVE
Comment: NEGATIVE
Comment: NEGATIVE
Comment: NORMAL
Neisseria Gonorrhea: NEGATIVE
Trichomonas: NEGATIVE

## 2022-11-10 ENCOUNTER — Other Ambulatory Visit: Payer: Self-pay | Admitting: Obstetrics

## 2022-11-10 DIAGNOSIS — B9689 Other specified bacterial agents as the cause of diseases classified elsewhere: Secondary | ICD-10-CM

## 2022-11-10 MED ORDER — METRONIDAZOLE 500 MG PO TABS: 500.0000 mg | ORAL_TABLET | Freq: Two times a day (BID) | ORAL | 2 refills | Status: DC

## 2022-11-13 LAB — CYTOLOGY - PAP: Diagnosis: NEGATIVE

## 2022-11-14 NOTE — Progress Notes (Signed)
Advised of results and RX. Pt is asymptomatic and may wait to pick up RX

## 2022-11-21 ENCOUNTER — Ambulatory Visit (INDEPENDENT_AMBULATORY_CARE_PROVIDER_SITE_OTHER): Payer: BC Managed Care – PPO

## 2022-11-21 DIAGNOSIS — Z30013 Encounter for initial prescription of injectable contraceptive: Secondary | ICD-10-CM | POA: Diagnosis not present

## 2022-11-21 LAB — POCT URINE PREGNANCY: Preg Test, Ur: NEGATIVE

## 2022-11-21 MED ORDER — MEDROXYPROGESTERONE ACETATE 150 MG/ML IM SUSP
150.0000 mg | Freq: Once | INTRAMUSCULAR | Status: AC
  Administered 2022-11-21: 150 mg via INTRAMUSCULAR

## 2022-11-21 NOTE — Progress Notes (Signed)
Date last pap: 11/07/22 Normal. Last Depo-Provera: 2022. Side Effects if any: N/A. Serum HCG indicated? N/A. Depo-Provera 150 mg IM given by: Donaciano Eva RN. Injection given in LD. Patient tolerated well Next appointment due 3/28/-4/11.   Patient presents for 2nd UPT for depo restart. Patient denies unprotected intercourse within the past two weeks.

## 2023-01-28 ENCOUNTER — Emergency Department (HOSPITAL_COMMUNITY)
Admission: EM | Admit: 2023-01-28 | Discharge: 2023-01-28 | Disposition: A | Discharge status: Home / Self Care | Payer: BC Managed Care – PPO | Attending: Emergency Medicine | Admitting: Emergency Medicine

## 2023-01-28 ENCOUNTER — Encounter (HOSPITAL_COMMUNITY): Payer: Self-pay

## 2023-01-28 ENCOUNTER — Other Ambulatory Visit: Payer: Self-pay

## 2023-01-28 DIAGNOSIS — K529 Noninfective gastroenteritis and colitis, unspecified: Secondary | ICD-10-CM | POA: Diagnosis not present

## 2023-01-28 DIAGNOSIS — R112 Nausea with vomiting, unspecified: Secondary | ICD-10-CM | POA: Diagnosis present

## 2023-01-28 LAB — COMPREHENSIVE METABOLIC PANEL
ALT: 18 U/L (ref 0–44)
AST: 36 U/L (ref 15–41)
Albumin: 4.3 g/dL (ref 3.5–5.0)
Alkaline Phosphatase: 27 U/L — ABNORMAL LOW (ref 38–126)
Anion gap: 8 (ref 5–15)
BUN: 7 mg/dL (ref 6–20)
CO2: 22 mmol/L (ref 22–32)
Calcium: 8.9 mg/dL (ref 8.9–10.3)
Chloride: 106 mmol/L (ref 98–111)
Creatinine, Ser: 1.03 mg/dL — ABNORMAL HIGH (ref 0.44–1.00)
GFR, Estimated: >60 mL/min (ref >60)
Glucose, Bld: 94 mg/dL (ref 70–99)
Potassium: 4 mmol/L (ref 3.5–5.1)
Sodium: 136 mmol/L (ref 135–145)
Total Bilirubin: 1.2 mg/dL (ref 0.3–1.2)
Total Protein: 7.8 g/dL (ref 6.5–8.1)

## 2023-01-28 LAB — CBC WITH DIFFERENTIAL/PLATELET
Abs Immature Granulocytes: 0.02 10*3/uL (ref 0.00–0.07)
Basophils Absolute: 0 10*3/uL (ref 0.0–0.1)
Basophils Relative: 0 %
Eosinophils Absolute: 0 10*3/uL (ref 0.0–0.5)
Eosinophils Relative: 0 %
HCT: 38.3 % (ref 36.0–46.0)
Hemoglobin: 12.9 g/dL (ref 12.0–15.0)
Immature Granulocytes: 0 %
Lymphocytes Relative: 16 %
Lymphs Abs: 1.2 10*3/uL (ref 0.7–4.0)
MCH: 31.4 pg (ref 26.0–34.0)
MCHC: 33.7 g/dL (ref 30.0–36.0)
MCV: 93.2 fL (ref 80.0–100.0)
Monocytes Absolute: 0.3 10*3/uL (ref 0.1–1.0)
Monocytes Relative: 4 %
Neutro Abs: 6.1 10*3/uL (ref 1.7–7.7)
Neutrophils Relative %: 80 %
Platelets: 304 10*3/uL (ref 150–400)
RBC: 4.11 MIL/uL (ref 3.87–5.11)
RDW: 14.6 % (ref 11.5–15.5)
WBC: 7.6 10*3/uL (ref 4.0–10.5)
nRBC: 0 % (ref 0.0–0.2)

## 2023-01-28 LAB — LIPASE, BLOOD: Lipase: 31 U/L (ref 11–51)

## 2023-01-28 MED ORDER — ONDANSETRON 4 MG PO TBDP: 4.0000 mg | ORAL_TABLET | Freq: Three times a day (TID) | ORAL | 0 refills | Status: DC | PRN

## 2023-01-28 MED ORDER — LACTATED RINGERS IV BOLUS
1000.0000 mL | Freq: Once | INTRAVENOUS | Status: AC
  Administered 2023-01-28: 1000 mL via INTRAVENOUS

## 2023-01-28 MED ORDER — MORPHINE SULFATE (PF) 4 MG/ML IV SOLN
4.0000 mg | Freq: Once | INTRAVENOUS | Status: AC
  Administered 2023-01-28: 4 mg via INTRAVENOUS
  Filled 2023-01-28: qty 1

## 2023-01-28 MED ORDER — ONDANSETRON HCL 4 MG/2ML IJ SOLN
4.0000 mg | Freq: Once | INTRAMUSCULAR | Status: AC
  Administered 2023-01-28: 4 mg via INTRAVENOUS
  Filled 2023-01-28: qty 2

## 2023-01-28 MED ORDER — DICYCLOMINE HCL 20 MG PO TABS: 20.0000 mg | ORAL_TABLET | Freq: Two times a day (BID) | ORAL | 0 refills | Status: DC

## 2023-01-28 NOTE — Discharge Instructions (Signed)
Your workup today was overall reassuring.  No concern for severe symptoms.  Blood work was without any concerns.  We discussed doing a CT scan however your symptoms improved following medications in the emergency department and you are in agreement to symptomatically treat and if anything gets worse she will come back to the emergency department.  I have sent Bentyl and Zofran into the pharmacy for you.

## 2023-01-28 NOTE — ED Provider Notes (Signed)
[facility] Provider Note   CSN: [id number] Arrival date & time: 01/28/23  1314     History  Chief Complaint  Patient presents with   Emesis    Phyllis Alexander is a 27 y.o. female.  27 year old female presents today for evaluation of nausea, vomiting, diarrhea, some abdominal discomfort since yesterday.  She states this started with diarrhea yesterday and today its developed into vomiting as well.  Denies any dysuria, flank pain, vaginal discharge, vaginal bleeding, or fever.  Has difficulty with p.o. intake.  The history is provided by the patient. No language interpreter was used.       Home Medications Prior to Admission medications   Medication Sig Start Date End Date Taking? Authorizing Provider  dicyclomine (BENTYL) 20 MG tablet Take 1 tablet (20 mg total) by mouth 2 (two) times daily. 01/28/23  Yes Zaylynn Rickett, PA-C  ondansetron (ZOFRAN-ODT) 4 MG disintegrating tablet Take 1 tablet (4 mg total) by mouth every 8 (eight) hours as needed. 01/28/23  Yes Raksha Wolfgang, PA-C  cephALEXin (KEFLEX) 500 MG capsule Take 1 capsule (500 mg total) by mouth 3 (three) times daily. Patient not taking: Reported on 08/23/2020 07/21/20   Petrucelli, Glynda Jaeger, PA-C  medroxyPROGESTERone (DEPO-PROVERA) 150 MG/ML injection Inject 1 mL (150 mg total) into the muscle every 3 (three) months. Patient not taking: Reported on 11/07/2022 08/23/20   Nugent, Gerrie Nordmann, NP  medroxyPROGESTERone (DEPO-PROVERA) 150 MG/ML injection Inject 1 mL (150 mg total) into the muscle every 3 (three) months. 02/08/21   Woodroe Mode, MD  medroxyPROGESTERone (DEPO-PROVERA) 150 MG/ML injection Inject 1 mL (150 mg total) into the muscle every 3 (three) months. 11/07/22   Shelly Bombard, MD  meloxicam (MOBIC) 15 MG tablet Take 1 tablet (15 mg total) by mouth daily as needed for pain. 02/28/22   Meccariello, Bernita Raisin, MD  metroNIDAZOLE (FLAGYL) 500 MG tablet Take 1 tablet (500 mg total)  by mouth 2 (two) times daily. 11/10/22   Shelly Bombard, MD  ondansetron (ZOFRAN) 4 MG tablet Take 1 tablet (4 mg total) by mouth every 6 (six) hours. 01/18/22   Prosperi, Christian H, PA-C  silver sulfADIAZINE (SILVADENE) 1 % cream Apply 1 application topically daily. 06/06/21   Teodora Medici, FNP      Allergies    Patient has no known allergies.    Review of Systems   Review of Systems  Constitutional:  Negative for chills and fever.  Gastrointestinal:  Positive for abdominal pain, diarrhea, nausea and vomiting.  Genitourinary:  Negative for dysuria, flank pain and vaginal discharge.  Neurological:  Negative for light-headedness.  All other systems reviewed and are negative.   Physical Exam Updated Vital Signs BP 117/85 (BP Location: Left Arm)   Pulse 69   Temp 98.7 F (37.1 C)   Resp 13   Ht 5\' 8"  (1.727 m)   Wt 64.9 kg   SpO2 100%   BMI 21.74 kg/m  Physical Exam Vitals and nursing note reviewed.  Constitutional:      General: She is not in acute distress.    Appearance: Normal appearance. She is not ill-appearing.  HENT:     Head: Normocephalic and atraumatic.     Nose: Nose normal.  Eyes:     General: No scleral icterus.    Extraocular Movements: Extraocular movements intact.     Conjunctiva/sclera: Conjunctivae normal.  Cardiovascular:     Rate and Rhythm: Normal rate and regular rhythm.  Pulses: Normal pulses.  Pulmonary:     Effort: Pulmonary effort is normal. No respiratory distress.     Breath sounds: Normal breath sounds. No wheezing or rales.  Abdominal:     General: There is no distension.     Palpations: Abdomen is soft.     Tenderness: There is no abdominal tenderness. There is no right CVA tenderness, left CVA tenderness, guarding or rebound.  Musculoskeletal:        General: Normal range of motion.     Cervical back: Normal range of motion.  Skin:    General: Skin is warm and dry.  Neurological:     General: No focal deficit present.      Mental Status: She is alert and oriented to person, place, and time. Mental status is at baseline.     ED Results / Procedures / Treatments   Labs (all labs ordered are listed, but only abnormal results are displayed) Labs Reviewed  COMPREHENSIVE METABOLIC PANEL - Abnormal; Notable for the following components:      Result Value   Creatinine, Ser 1.03 (*)    Alkaline Phosphatase 27 (*)    All other components within normal limits  CBC WITH DIFFERENTIAL/PLATELET  LIPASE, BLOOD  URINALYSIS, ROUTINE W REFLEX MICROSCOPIC  I-STAT BETA HCG BLOOD, ED ([facility], [facility], [facility] ONLY)    EKG None  Radiology No results found.  Procedures Procedures    Medications Ordered in ED Medications  ondansetron (ZOFRAN) injection 4 mg (4 mg Intravenous Given 01/28/23 1418)  morphine (PF) 4 MG/ML injection 4 mg (4 mg Intravenous Given 01/28/23 1418)  lactated ringers bolus 1,000 mL (1,000 mLs Intravenous New Bag/Given 01/28/23 1418)    ED Course/ Medical Decision Making/ A&P                             Medical Decision Making Amount and/or Complexity of Data Reviewed Labs: ordered.  Risk Prescription drug management.   Medical Decision Making / ED Course   This patient presents to the ED for concern of vomiting, diarrhea, abdominal pain, this involves an extensive number of treatment options, and is a complaint that carries with it a high risk of complications and morbidity.  The differential diagnosis includes gastroenteritis, colitis, diverticulitis, appendicitis, cholecystitis, UTI, pyelonephritis  MDM: 27 year old female presents today for evaluation of above-mentioned planes.  Overall she is well-appearing.  Without acute distress.  Abdomen without any significant tenderness to palpation.  Abdomen is soft and without distention.  Story appears most consistent with gastroenteritis.  Low suspicion for appendicitis or cholecystitis as she is afebrile, without significant tenderness, without  tachycardia.  Pain is generalized as opposed to be localized.  CBC without leukocytosis.  CMP with preserved renal function and otherwise without acute concerns.  Lipase within normal limits.  Following medications and fluids she does feel improved.  We did have a shared decision making regarding additional workup or treat with symptomatic management and strict return precautions.  She is in agreement with symptomatic management and strict return precautions.  Does not want to pursue CT at this time.  I feel this is reasonable.  Lab Tests: -I ordered, reviewed, and interpreted labs.   The pertinent results include:   Labs Reviewed  COMPREHENSIVE METABOLIC PANEL - Abnormal; Notable for the following components:      Result Value   Creatinine, Ser 1.03 (*)    Alkaline Phosphatase 27 (*)    All other components  within normal limits  CBC WITH DIFFERENTIAL/PLATELET  LIPASE, BLOOD  URINALYSIS, ROUTINE W REFLEX MICROSCOPIC  I-STAT BETA HCG BLOOD, ED ([facility], [facility], [facility] ONLY)      EKG  EKG Interpretation  Date/Time:    Ventricular Rate:    PR Interval:    QRS Duration:   QT Interval:    QTC Calculation:   R Axis:     Text Interpretation:          Medicines ordered and prescription drug management: Meds ordered this encounter  Medications   ondansetron (ZOFRAN) injection 4 mg   morphine (PF) 4 MG/ML injection 4 mg   lactated ringers bolus 1,000 mL   dicyclomine (BENTYL) 20 MG tablet    Sig: Take 1 tablet (20 mg total) by mouth 2 (two) times daily.    Dispense:  20 tablet    Refill:  0    Order Specific Question:   Supervising Provider    Answer:   MILLER, BRIAN [[id number]]   ondansetron (ZOFRAN-ODT) 4 MG disintegrating tablet    Sig: Take 1 tablet (4 mg total) by mouth every 8 (eight) hours as needed.    Dispense:  20 tablet    Refill:  0    Order Specific Question:   Supervising Provider    Answer:   Sabra Heck, BRIAN [[id number]]    -I have reviewed the patients home medicines and have made  adjustments as needed  Critical interventions Zofran, morphine, fluid bolus for symptom management   Reevaluation: After the interventions noted above, I reevaluated the patient and found that they have :improved  Co morbidities that complicate the patient evaluation  Past Medical History:  Diagnosis Date   Encounter for supervision of normal pregnancy, unspecified, unspecified trimester 11/11/2019    Nursing Staff Provider Office Location  [facility] Dating  LMP  Language  English Anatomy US   f/u in 4 weeks  Flu Vaccine  Declined 11/11/19 Genetic Screen  NIPS: low risks AFP: 11/11/2019  neg  TDaP vaccine   Given 01/13/20 Hgb A1C or  GTT Early  Third trimester nl 2 hour Rhogam  NA   LAB RESULTS  Feeding Plan Breast Blood Type --/--/A POS (05/03 0024)  Contraception Depo Antibody NEG (05/03 0024) Ci   Medical history non-contributory    Pregnancy affected by fetal growth restriction 03/09/2020   Previous cesarean delivery affecting pregnancy, antepartum 11/11/2019   Desires TOLAC   VBAC (vaginal birth after Cesarean) 03/14/2020      Dispostion: Patient is appropriate for discharge.  Discharged in stable condition.  Return precaution discussed.  Patient voiced understanding and is in agreement with plan.  Final Clinical Impression(s) / ED Diagnoses Final diagnoses:  Gastroenteritis    Rx / DC Orders ED Discharge Orders          Ordered    dicyclomine (BENTYL) 20 MG tablet  2 times daily        01/28/23 1452    ondansetron (ZOFRAN-ODT) 4 MG disintegrating tablet  Every 8 hours PRN        01/28/23 1452              Evlyn Courier, PA-C 01/28/23 1504    Sherwood Gambler, MD 02/01/23 [phone]

## 2023-01-28 NOTE — ED Triage Notes (Signed)
C/o n/v/d x1 day  Abd pain only with emesis described as stabbing.  Currently on menstrual

## 2023-02-13 ENCOUNTER — Other Ambulatory Visit: Payer: Self-pay

## 2023-02-13 ENCOUNTER — Ambulatory Visit (INDEPENDENT_AMBULATORY_CARE_PROVIDER_SITE_OTHER): Payer: BC Managed Care – PPO

## 2023-02-13 DIAGNOSIS — Z3042 Encounter for surveillance of injectable contraceptive: Secondary | ICD-10-CM

## 2023-02-13 MED ORDER — MEDROXYPROGESTERONE ACETATE 150 MG/ML IM SUSP: 150.0000 mg | INTRAMUSCULAR | 0 refills | Status: DC

## 2023-02-13 MED ORDER — MEDROXYPROGESTERONE ACETATE 150 MG/ML IM SUSY
150.0000 mg | PREFILLED_SYRINGE | Freq: Once | INTRAMUSCULAR | Status: AC
  Administered 2023-02-13: 150 mg via INTRAMUSCULAR

## 2023-02-13 NOTE — Progress Notes (Signed)
Date last pap: 11/07/22. Last Depo-Provera: 11/21/22. Side Effects if any: N/A. Serum HCG indicated? N/A. Depo-Provera 150 mg IM given by: Donaciano Eva., RN. Injection given in LD per patient request. Patient tolerated well. Next appointment due June 19- July 3.

## 2023-02-15 ENCOUNTER — Emergency Department (HOSPITAL_COMMUNITY)
Admission: EM | Admit: 2023-02-15 | Discharge: 2023-02-15 | Disposition: A | Discharge status: Home / Self Care | Payer: BC Managed Care – PPO | Attending: Emergency Medicine | Admitting: Emergency Medicine

## 2023-02-15 ENCOUNTER — Emergency Department (HOSPITAL_COMMUNITY): Payer: BC Managed Care – PPO

## 2023-02-15 ENCOUNTER — Other Ambulatory Visit: Payer: Self-pay

## 2023-02-15 DIAGNOSIS — E876 Hypokalemia: Secondary | ICD-10-CM | POA: Insufficient documentation

## 2023-02-15 DIAGNOSIS — R1032 Left lower quadrant pain: Secondary | ICD-10-CM | POA: Diagnosis not present

## 2023-02-15 DIAGNOSIS — Y92410 Unspecified street and highway as the place of occurrence of the external cause: Secondary | ICD-10-CM | POA: Insufficient documentation

## 2023-02-15 DIAGNOSIS — R0789 Other chest pain: Secondary | ICD-10-CM | POA: Diagnosis not present

## 2023-02-15 DIAGNOSIS — M545 Low back pain, unspecified: Secondary | ICD-10-CM | POA: Diagnosis not present

## 2023-02-15 DIAGNOSIS — R1012 Left upper quadrant pain: Secondary | ICD-10-CM | POA: Diagnosis not present

## 2023-02-15 LAB — PROTIME-INR
INR: 0.9 (ref 0.8–1.2)
Prothrombin Time: 12.2 seconds (ref 11.4–15.2)

## 2023-02-15 LAB — COMPREHENSIVE METABOLIC PANEL
ALT: 16 U/L (ref 0–44)
AST: 22 U/L (ref 15–41)
Albumin: 3.9 g/dL (ref 3.5–5.0)
Alkaline Phosphatase: 27 U/L — ABNORMAL LOW (ref 38–126)
Anion gap: 10 (ref 5–15)
BUN: 5 mg/dL — ABNORMAL LOW (ref 6–20)
CO2: 19 mmol/L — ABNORMAL LOW (ref 22–32)
Calcium: 9.1 mg/dL (ref 8.9–10.3)
Chloride: 106 mmol/L (ref 98–111)
Creatinine, Ser: 0.94 mg/dL (ref 0.44–1.00)
GFR, Estimated: >60 mL/min (ref >60)
Glucose, Bld: 104 mg/dL — ABNORMAL HIGH (ref 70–99)
Potassium: 3.2 mmol/L — ABNORMAL LOW (ref 3.5–5.1)
Sodium: 135 mmol/L (ref 135–145)
Total Bilirubin: 0.7 mg/dL (ref 0.3–1.2)
Total Protein: 7.1 g/dL (ref 6.5–8.1)

## 2023-02-15 LAB — CBC
HCT: 35.7 % — ABNORMAL LOW (ref 36.0–46.0)
Hemoglobin: 12.2 g/dL (ref 12.0–15.0)
MCH: 32 pg (ref 26.0–34.0)
MCHC: 34.2 g/dL (ref 30.0–36.0)
MCV: 93.7 fL (ref 80.0–100.0)
Platelets: 317 10*3/uL (ref 150–400)
RBC: 3.81 MIL/uL — ABNORMAL LOW (ref 3.87–5.11)
RDW: 14.6 % (ref 11.5–15.5)
WBC: 7.6 10*3/uL (ref 4.0–10.5)
nRBC: 0 % (ref 0.0–0.2)

## 2023-02-15 LAB — I-STAT CHEM 8, ED
BUN: <3 mg/dL — ABNORMAL LOW (ref 6–20)
Calcium, Ion: 1.16 mmol/L (ref 1.15–1.40)
Chloride: 106 mmol/L (ref 98–111)
Creatinine, Ser: 0.9 mg/dL (ref 0.44–1.00)
Glucose, Bld: 103 mg/dL — ABNORMAL HIGH (ref 70–99)
HCT: 38 % (ref 36.0–46.0)
Hemoglobin: 12.9 g/dL (ref 12.0–15.0)
Potassium: 3.4 mmol/L — ABNORMAL LOW (ref 3.5–5.1)
Sodium: 139 mmol/L (ref 135–145)
TCO2: 22 mmol/L (ref 22–32)

## 2023-02-15 LAB — SAMPLE TO BLOOD BANK

## 2023-02-15 LAB — I-STAT BETA HCG BLOOD, ED (MC, WL, AP ONLY): I-stat hCG, quantitative: <5 m[IU]/mL (ref <5)

## 2023-02-15 LAB — LACTIC ACID, PLASMA: Lactic Acid, Venous: 1.5 mmol/L (ref 0.5–1.9)

## 2023-02-15 LAB — ETHANOL: Alcohol, Ethyl (B): 13 mg/dL — ABNORMAL HIGH (ref <10)

## 2023-02-15 MED ORDER — POTASSIUM CHLORIDE CRYS ER 20 MEQ PO TBCR: 20.0000 meq | EXTENDED_RELEASE_TABLET | Freq: Every day | ORAL | 0 refills | Status: DC

## 2023-02-15 MED ORDER — IBUPROFEN 800 MG PO TABS: 800.0000 mg | ORAL_TABLET | Freq: Three times a day (TID) | ORAL | 0 refills | Status: DC | PRN

## 2023-02-15 MED ORDER — FENTANYL CITRATE PF 50 MCG/ML IJ SOSY: 50.0000 ug | PREFILLED_SYRINGE | Freq: Once | INTRAMUSCULAR | Status: DC

## 2023-02-15 MED ORDER — FENTANYL CITRATE PF 50 MCG/ML IJ SOSY
50.0000 ug | PREFILLED_SYRINGE | Freq: Once | INTRAMUSCULAR | Status: AC | PRN
  Administered 2023-02-15: 50 ug via INTRAVENOUS
  Filled 2023-02-15: qty 1

## 2023-02-15 MED ORDER — METHOCARBAMOL 500 MG PO TABS: 500.0000 mg | ORAL_TABLET | Freq: Four times a day (QID) | ORAL | 0 refills | Status: DC

## 2023-02-15 MED ORDER — IOHEXOL 350 MG/ML SOLN
75.0000 mL | Freq: Once | INTRAVENOUS | Status: AC | PRN
  Administered 2023-02-15: 75 mL via INTRAVENOUS

## 2023-02-15 NOTE — ED Notes (Signed)
ED Provider at bedside. 

## 2023-02-15 NOTE — Discharge Instructions (Signed)
Follow up with your Physician for recheck in 1 week if pain persist  
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
## 2023-02-15 NOTE — ED Notes (Signed)
Pt ambulated without assistance
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
## 2023-03-14 ENCOUNTER — Ambulatory Visit: Payer: BC Managed Care – PPO | Admitting: Physician Assistant

## 2023-03-14 ENCOUNTER — Encounter: Payer: Self-pay | Admitting: Physician Assistant

## 2023-03-14 VITALS — BP 118/82 | HR 85 | Temp 97.5°F | Ht 68.0 in | Wt 149.0 lb

## 2023-03-14 DIAGNOSIS — G4700 Insomnia, unspecified: Secondary | ICD-10-CM | POA: Insufficient documentation

## 2023-03-14 DIAGNOSIS — M2012 Hallux valgus (acquired), left foot: Secondary | ICD-10-CM | POA: Insufficient documentation

## 2023-03-14 DIAGNOSIS — M545 Low back pain, unspecified: Secondary | ICD-10-CM | POA: Insufficient documentation

## 2023-03-14 MED ORDER — TRAZODONE HCL 50 MG PO TABS: 25.0000 mg | ORAL_TABLET | Freq: Every evening | ORAL | 0 refills | Status: DC | PRN

## 2023-03-14 NOTE — Assessment & Plan Note (Signed)
Chronic, lifelong, working nights Trial Trazodone 25-50 mg prn Pt aware of risks vs benefits and possible adverse reactions She will reach out with update. Sleep hygiene discussed and emphasized!

## 2023-03-14 NOTE — Patient Instructions (Signed)
Welcome to [facility]! It was a pleasure meeting you today.  As discussed, Please schedule a physical later this year.  Try to take the Ibuprofen 800 mg twice daily with food at least 7-10 days to help with inflammation. You can try the methocarbamol for muscle relaxation, but caution of drowsiness.  For sleep concerns -  Let's trial Trazodone 25-50 mg as needed around your bedtime. Send me a message in a few weeks with an update how this works.   Please work on the following to help with sleep:  -Sleep only long enough to feel rested then get out of bed -Go to bed and get up at the same time every day. -Do not try to force yourself to sleep. If you can't sleep, get out of bed and try again later. -Have coffee, tea, and other foods that have caffeine only in the morning. -Avoid alcohol -Keep your bedroom dark, cool, quiet, and free of reminders of work or other things that cause you stress -Exercise several days a week, but not right before bed -Avoid looking at phones or reading devices ("e-books") that give off light before bed. This can make it harder to fall asleep   PLEASE NOTE:  If you had any LAB tests please let us know if you have not heard back within a few days. You may see your results on MyChart before we have a chance to review them but we will give you a call once they are reviewed by [place]. If we ordered any REFERRALS today, please let us know if you have not heard from their office within the next two weeks. Let us know through MyChart if you are needing REFILLS, or have your pharmacy send [place] the request. You can also use MyChart to communicate with me or any office staff.  Please try these tips to maintain a healthy lifestyle:  Eat most of your calories during the day when you are active. Eliminate processed foods including packaged sweets (pies, cakes, cookies), reduce intake of potatoes, white bread, white pasta, and white rice. Look for  whole grain options, oat flour or almond flour.  Each meal should contain half fruits/vegetables, one quarter protein, and one quarter carbs (no bigger than a computer mouse).  Cut down on sweet beverages. This includes juice, soda, and sweet tea. Also watch fruit intake, though this is a healthier sweet option, it still contains natural sugar! Limit to 3 servings daily.  Drink at least 1 glass of water with each meal and aim for at least 8 glasses (64 ounces) per day.  Exercise at least 150 minutes every week to the best of your ability.    Take Care,  Francisca Harbuck, PA-C
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
## 2023-05-14 ENCOUNTER — Ambulatory Visit: Payer: BC Managed Care – PPO

## 2023-05-20 ENCOUNTER — Ambulatory Visit: Payer: BC Managed Care – PPO

## 2023-07-11 ENCOUNTER — Encounter: Payer: Self-pay | Admitting: Physician Assistant

## 2023-09-16 ENCOUNTER — Encounter: Payer: BC Managed Care – PPO | Admitting: Physician Assistant

## 2023-10-03 ENCOUNTER — Ambulatory Visit: Payer: BC Managed Care – PPO | Admitting: Physician Assistant

## 2023-10-03 VITALS — BP 125/84 | HR 66 | Temp 98.0°F | Ht 68.0 in | Wt 155.8 lb

## 2023-10-03 DIAGNOSIS — R1030 Lower abdominal pain, unspecified: Secondary | ICD-10-CM | POA: Diagnosis not present

## 2023-10-03 DIAGNOSIS — K429 Umbilical hernia without obstruction or gangrene: Secondary | ICD-10-CM | POA: Diagnosis not present

## 2023-10-03 LAB — POC URINALSYSI DIPSTICK (AUTOMATED)
Bilirubin, UA: NEGATIVE
Blood, UA: NEGATIVE
Glucose, UA: NEGATIVE
Ketones, UA: POSITIVE
Leukocytes, UA: NEGATIVE
Nitrite, UA: NEGATIVE
Protein, UA: NEGATIVE
Spec Grav, UA: >=1.03 — AB (ref 1.010–1.025)
Urobilinogen, UA: 0.2 U/dL
pH, UA: 5.5 (ref 5.0–8.0)

## 2023-10-03 NOTE — Progress Notes (Signed)
Patient ID: Phyllis Alexander, female    DOB: [birth year], 27 y.o.   MRN: [id number]   Assessment & Plan:  Umbilical hernia without obstruction and without gangrene -     POCT Urinalysis Dipstick (Automated)  Lower abdominal pain -     POCT Urinalysis Dipstick (Automated)   Assessment and Plan    Umbilical Hernia Newly noticed bumps around the umbilicus, possibly related to previous C-section. No pain, nausea, or vomiting. No family history of similar condition. -Monitor for changes in size, pain, or redness. -Consider ultrasound if significant changes occur. -Follow up in 2-3 months or sooner if changes occur.  Abdominal Pain Recent onset, possibly related to diet or constipation. No changes in bowel movements. -Collect urine sample to rule out UTI or other urinary issues. -Monitor for changes or worsening of symptoms.    Return in about 8 weeks (around 11/28/2023) for recheck/follow-up.    Subjective:    Chief Complaint  Patient presents with   skin issue    Pt in office and has noticed abnormality in belly button and wants it to be looked at to ensure it's ok    HPI Discussed the use of AI scribe software for clinical note transcription with the patient, who gave verbal consent to proceed.  History of Present Illness   The patient presents with a newly noticed abdominal mass, initially presenting as an itchy bump near the umbilicus. Over time, additional bumps appeared, but without associated pain, nausea, or vomiting. The patient reports a decrease in appetite and recent alcohol consumption without adequate food intake. Menstrual periods are regular and the patient denies current pregnancy. Bowel movements are regular, but the patient did not have a bowel movement today. The patient has a history of four pregnancies, two living children, and one C-section. The patient denies any family history of similar abdominal masses.       Past Medical History:  Diagnosis Date    Anemia    Anxiety    Encounter for supervision of normal pregnancy, unspecified, unspecified trimester 11/11/2019    Nursing Staff Provider Office Location  [facility] Dating  LMP  Language  English Anatomy US   f/u in 4 weeks  Flu Vaccine  Declined 11/11/19 Genetic Screen  NIPS: low risks AFP: 11/11/2019  neg  TDaP vaccine   Given 01/13/20 Hgb A1C or  GTT Early  Third trimester nl 2 hour Rhogam  NA   LAB RESULTS  Feeding Plan Breast Blood Type --/--/A POS (05/03 0024)  Contraception Depo Antibody NEG (05/03 0024) Ci   Medical history non-contributory    Pregnancy affected by fetal growth restriction 03/09/2020   Previous cesarean delivery affecting pregnancy, antepartum 11/11/2019   Desires TOLAC   Seizures (HCC)    VBAC (vaginal birth after Cesarean) 03/14/2020    Past Surgical History:  Procedure Laterality Date   CESAREAN SECTION     DILATION AND CURETTAGE OF UTERUS     WISDOM TOOTH EXTRACTION      Family History  Problem Relation Age of Onset   Healthy Mother    Healthy Father    ADD / ADHD Brother    Hypertension Maternal Aunt    Arthritis Maternal Grandmother    Diabetes Maternal Grandmother    Hypertension Maternal Grandmother    Stroke Maternal Grandmother    Arthritis Paternal Grandmother    Diabetes Other     Social History   Tobacco Use   Smoking status: Every Day    Current packs/day:  0.00    Average packs/day: 0.5 packs/day for 10.0 years (5.0 ttl pk-yrs)    Types: Cigarettes    Start date: 08/15/2008    Last attempt to quit: 08/15/2018    Years since quitting: 5.1   Smokeless tobacco: Never  Vaping Use   Vaping status: Never Used  Substance Use Topics   Alcohol use: Yes    Alcohol/week: 6.0 standard drinks of alcohol    Types: 6 Shots of liquor per week   Drug use: Yes    Types: Marijuana    Comment: 01/17/22     No Known Allergies  Review of Systems NEGATIVE UNLESS OTHERWISE INDICATED IN HPI      Objective:     BP 125/84   Pulse 66   Temp 98  F (36.7 C) (Temporal)   Ht 5\' 8"  (1.727 m)   Wt 155 lb 12.8 oz (70.7 kg)   LMP 09/12/2023 (Approximate)   SpO2 98%   BMI 23.69 kg/m   Wt Readings from Last 3 Encounters:  10/03/23 155 lb 12.8 oz (70.7 kg)  03/14/23 149 lb (67.6 kg)  01/28/23 143 lb (64.9 kg)    BP Readings from Last 3 Encounters:  10/03/23 125/84  03/14/23 118/82  02/15/23 110/84     Physical Exam Vitals and nursing note reviewed.  Constitutional:      Appearance: Normal appearance.  Cardiovascular:     Rate and Rhythm: Normal rate and regular rhythm.  Pulmonary:     Effort: Pulmonary effort is normal.     Breath sounds: Normal breath sounds.  Abdominal:     General: Abdomen is flat. There is no distension.     Palpations: Abdomen is soft. There is no mass.     Tenderness: There is abdominal tenderness (mild TTP lower abdomen). There is no right CVA tenderness, left CVA tenderness, guarding or rebound.     Hernia: A hernia (small umbilical hernia ? noted, not strangulated; no ertynema, no pain) is present.  Neurological:     Mental Status: She is alert.  Psychiatric:        Mood and Affect: Mood normal.        Behavior: Behavior normal.          Channa Hazelett M Ahnya Akre, PA-C

## 2023-10-11 ENCOUNTER — Emergency Department (HOSPITAL_COMMUNITY)
Admission: EM | Admit: 2023-10-11 | Discharge: 2023-10-12 | Disposition: A | Discharge status: Home / Self Care | Payer: Worker's Compensation | Attending: Emergency Medicine | Admitting: Emergency Medicine

## 2023-10-11 ENCOUNTER — Other Ambulatory Visit: Payer: Self-pay

## 2023-10-11 ENCOUNTER — Emergency Department (HOSPITAL_COMMUNITY): Payer: Worker's Compensation

## 2023-10-11 ENCOUNTER — Encounter (HOSPITAL_COMMUNITY): Payer: Self-pay

## 2023-10-11 DIAGNOSIS — M25561 Pain in right knee: Secondary | ICD-10-CM | POA: Insufficient documentation

## 2023-10-11 DIAGNOSIS — M25571 Pain in right ankle and joints of right foot: Secondary | ICD-10-CM | POA: Insufficient documentation

## 2023-10-11 DIAGNOSIS — M25572 Pain in left ankle and joints of left foot: Secondary | ICD-10-CM | POA: Diagnosis not present

## 2023-10-11 DIAGNOSIS — F17210 Nicotine dependence, cigarettes, uncomplicated: Secondary | ICD-10-CM | POA: Insufficient documentation

## 2023-10-11 DIAGNOSIS — W228XXA Striking against or struck by other objects, initial encounter: Secondary | ICD-10-CM | POA: Insufficient documentation

## 2023-10-11 DIAGNOSIS — T1490XA Injury, unspecified, initial encounter: Secondary | ICD-10-CM

## 2023-10-11 DIAGNOSIS — Y990 Civilian activity done for income or pay: Secondary | ICD-10-CM | POA: Diagnosis not present
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
## 2023-10-12 MED ORDER — NAPROXEN 250 MG PO TABS
500.0000 mg | ORAL_TABLET | Freq: Once | ORAL | Status: AC
  Administered 2023-10-12: 500 mg via ORAL
  Filled 2023-10-12: qty 2

## 2023-10-12 MED ORDER — NAPROXEN 500 MG PO TABS: 500.0000 mg | ORAL_TABLET | Freq: Two times a day (BID) | ORAL | 0 refills | Status: DC

## 2023-10-12 NOTE — ED Provider Notes (Signed)
[facility] Emergency Department Provider Note MRN:  [id number]  Arrival date & time: 10/12/23     Chief Complaint   Ankle Pain (Left and right)   History of Present Illness   Phyllis Alexander is a 27 y.o. year-old female with no pertinent past medical history presenting to the ED with chief complaint of ankle pain.  Bilateral ankle pain and right knee pain after sustaining injury at work.  Struck by a pallet being moved by [employer].  Denies head trauma, no loss of consciousness, no neck or back pain, no chest pain or shortness of breath, no abdominal pain, no numbness or weakness to the arms or legs.  Review of Systems  A thorough review of systems was obtained and all systems are negative except as noted in the HPI and PMH.   Patient's Health History    Past Medical History:  Diagnosis Date   Anemia    Anxiety    Encounter for supervision of normal pregnancy, unspecified, unspecified trimester 11/11/2019    Nursing Staff Provider Office Location  [facility] Dating  LMP  Language  English Anatomy US   f/u in 4 weeks  Flu Vaccine  Declined 11/11/19 Genetic Screen  NIPS: low risks AFP: 11/11/2019  neg  TDaP vaccine   Given 01/13/20 Hgb A1C or  GTT Early  Third trimester nl 2 hour Rhogam  NA   LAB RESULTS  Feeding Plan Breast Blood Type --/--/A POS (05/03 0024)  Contraception Depo Antibody NEG (05/03 0024) Ci   Medical history non-contributory    Pregnancy affected by fetal growth restriction 03/09/2020   Previous cesarean delivery affecting pregnancy, antepartum 11/11/2019   Desires TOLAC   Seizures (HCC)    VBAC (vaginal birth after Cesarean) 03/14/2020    Past Surgical History:  Procedure Laterality Date   CESAREAN SECTION     DILATION AND CURETTAGE OF UTERUS     WISDOM TOOTH EXTRACTION      Family History  Problem Relation Age of Onset   Healthy Mother    Healthy Father    ADD / ADHD Brother    Hypertension Maternal Aunt    Arthritis Maternal  Grandmother    Diabetes Maternal Grandmother    Hypertension Maternal Grandmother    Stroke Maternal Grandmother    Arthritis Paternal Grandmother    Diabetes Other     Social History   Socioeconomic History   Marital status: Single    Spouse name: Not on file   Number of children: Not on file   Years of education: Not on file   Highest education level: Some college, no degree  Occupational History   Not on file  Tobacco Use   Smoking status: Every Day    Current packs/day: 0.00    Average packs/day: 0.5 packs/day for 10.0 years (5.0 ttl pk-yrs)    Types: Cigarettes    Start date: 08/15/2008    Last attempt to quit: 08/15/2018    Years since quitting: 5.1   Smokeless tobacco: Never  Vaping Use   Vaping status: Never Used  Substance and Sexual Activity   Alcohol use: Yes    Alcohol/week: 6.0 standard drinks of alcohol    Types: 6 Shots of liquor per week   Drug use: Yes    Types: Marijuana    Comment: 01/17/22   Sexual activity: Not Currently    Birth control/protection: Abstinence, Condom, Injection  Other Topics Concern   Not on file  Social History Narrative   Not  on file   Social Determinants of Health   Financial Resource Strain: Low Risk  (10/03/2023)   Overall Financial Resource Strain (CARDIA)    Difficulty of Paying Living Expenses: Not hard at all  Food Insecurity: No Food Insecurity (10/03/2023)   Hunger Vital Sign    Worried About Running Out of Food in the Last Year: Never true    Ran Out of Food in the Last Year: Never true  Transportation Needs: Unmet Transportation Needs (10/03/2023)   PRAPARE - Administrator, Civil Service (Medical): Yes    Lack of Transportation (Non-Medical): Yes  Physical Activity: Unknown (10/03/2023)   Exercise Vital Sign    Days of Exercise per Week: 0 days    Minutes of Exercise per Session: Not on file  Stress: No Stress Concern Present (10/03/2023)   [employer] of Occupational Health - Occupational  Stress Questionnaire    Feeling of Stress : Not at all  Social Connections: Socially Isolated (10/03/2023)   Social Connection and Isolation Panel [NHANES]    Frequency of Communication with Friends and Family: More than three times a week    Frequency of Social Gatherings with Friends and Family: Twice a week    Attends Religious Services: Never    Database administrator or Organizations: No    Attends Engineer, structural: Not on file    Marital Status: Never married  Intimate Partner Violence: Not At Risk (09/09/2018)   Humiliation, Afraid, Rape, and Kick questionnaire    Fear of Current or Ex-Partner: No    Emotionally Abused: No    Physically Abused: No    Sexually Abused: No     Physical Exam   Vitals:   10/11/23 2129  BP: 112/81  Pulse: 84  Resp: 16  Temp: 98.7 F (37.1 C)  SpO2: 98%    CONSTITUTIONAL: Well-appearing, NAD NEURO/PSYCH:  Alert and oriented x 3, no focal deficits EYES:  eyes equal and reactive ENT/NECK:  no LAD, no JVD CARDIO: Regular rate, well-perfused, normal S1 and S2 PULM:  CTAB no wheezing or rhonchi GI/GU:  non-distended, non-tender MSK/SPINE:  No gross deformities, no edema SKIN:  no rash, atraumatic   *Additional and/or pertinent findings included in MDM below  Diagnostic and Interventional Summary    EKG Interpretation Date/Time:    Ventricular Rate:    PR Interval:    QRS Duration:    QT Interval:    QTC Calculation:   R Axis:      Text Interpretation:         Labs Reviewed - No data to display  DG Ankle Complete Right  Final Result    DG Ankle Complete Left  Final Result    DG Knee Complete 4 Views Right  Final Result      Medications  naproxen (NAPROSYN) tablet 500 mg (has no administration in time range)     Procedures  /  Critical Care Procedures  ED Course and Medical Decision Making  Initial Impression and Ddx Patient is well-appearing with normal vital signs, bilateral ankle and right knee  pain.  Largely preserved range of motion, a bit hesitant due to pain, no significant laxity to the knee, ankles mildly tender.  X-rays to rule out fracture.  Neurovascularly intact, doubt emergent condition.  Past medical/surgical history that increases complexity of ED encounter: None  Interpretation of Diagnostics I personally reviewed the knee and ankle x-rays and my interpretation is as follows: No fracture  Patient Reassessment and Ultimate Disposition/Management     Discharge  Patient management required discussion with the following services or consulting groups:  None  Complexity of Problems Addressed Acute complicated illness or Injury  Additional Data Reviewed and Analyzed Further history obtained from: Further history from spouse/family member  Additional Factors Impacting ED Encounter Risk Prescriptions  Elmer Sow. Pilar Plate, MD [facility] Emergency Medicine [facility] [email or web address]  Final Clinical Impressions(s) / ED Diagnoses     ICD-10-CM   1. Acute bilateral ankle pain  M25.571    M25.572     2. Acute pain of right knee  M25.561     3. Blunt trauma  T14.90XA       ED Discharge Orders          Ordered    naproxen (NAPROSYN) 500 MG tablet  2 times daily        10/12/23 0228             Discharge Instructions Discussed with and Provided to Patient:    Discharge Instructions      You were evaluated in the Emergency Department and after careful evaluation, we did not find any emergent condition requiring admission or further testing in the hospital.  Your exam/testing today is overall reassuring.  X-rays do not show any broken bones or emergencies.  Can use the Naprosyn anti-inflammatory twice daily for pain.  Please return to the Emergency Department if you experience any worsening of your condition.   Thank you for allowing [place] to be a part of your care.      Sabas Sous, MD 10/12/23 [phone]

## 2023-10-12 NOTE — Discharge Instructions (Signed)
You were evaluated in the Emergency Department and after careful evaluation, we did not find any emergent condition requiring admission or further testing in the hospital.  Your exam/testing today is overall reassuring.  X-rays do not show any broken bones or emergencies.  Can use the Naprosyn anti-inflammatory twice daily for pain.  Please return to the Emergency Department if you experience any worsening of your condition.   Thank you for allowing [place] to be a part of your care.

## 2023-11-01 ENCOUNTER — Other Ambulatory Visit: Payer: Self-pay | Admitting: Nurse Practitioner

## 2023-11-01 DIAGNOSIS — S9002XA Contusion of left ankle, initial encounter: Secondary | ICD-10-CM

## 2023-11-26 ENCOUNTER — Emergency Department (HOSPITAL_COMMUNITY)
Admission: EM | Admit: 2023-11-26 | Discharge: 2023-11-27 | Disposition: A | Discharge status: Home / Self Care | Payer: BLUE CROSS/BLUE SHIELD | Attending: Emergency Medicine | Admitting: Emergency Medicine

## 2023-11-26 ENCOUNTER — Other Ambulatory Visit: Payer: Self-pay

## 2023-11-26 ENCOUNTER — Encounter (HOSPITAL_COMMUNITY): Payer: Self-pay

## 2023-11-26 DIAGNOSIS — Z20822 Contact with and (suspected) exposure to covid-19: Secondary | ICD-10-CM | POA: Diagnosis not present

## 2023-11-26 DIAGNOSIS — J101 Influenza due to other identified influenza virus with other respiratory manifestations: Secondary | ICD-10-CM | POA: Diagnosis not present

## 2023-11-26 DIAGNOSIS — R059 Cough, unspecified: Secondary | ICD-10-CM | POA: Diagnosis present

## 2023-11-26 DIAGNOSIS — J111 Influenza due to unidentified influenza virus with other respiratory manifestations: Secondary | ICD-10-CM

## 2023-11-26 LAB — BASIC METABOLIC PANEL
Anion gap: 13 (ref 5–15)
BUN: <5 mg/dL — ABNORMAL LOW (ref 6–20)
CO2: 19 mmol/L — ABNORMAL LOW (ref 22–32)
Calcium: 9.3 mg/dL (ref 8.9–10.3)
Chloride: 100 mmol/L (ref 98–111)
Creatinine, Ser: 0.86 mg/dL (ref 0.44–1.00)
GFR, Estimated: >60 mL/min (ref >60)
Glucose, Bld: 95 mg/dL (ref 70–99)
Potassium: 3.2 mmol/L — ABNORMAL LOW (ref 3.5–5.1)
Sodium: 132 mmol/L — ABNORMAL LOW (ref 135–145)

## 2023-11-26 LAB — RESP PANEL BY RT-PCR (RSV, FLU A&B, COVID)  RVPGX2
Influenza A by PCR: POSITIVE — AB
Influenza B by PCR: NEGATIVE
Resp Syncytial Virus by PCR: NEGATIVE
SARS Coronavirus 2 by RT PCR: NEGATIVE

## 2023-11-26 LAB — CBC
HCT: 38.1 % (ref 36.0–46.0)
Hemoglobin: 13 g/dL (ref 12.0–15.0)
MCH: 32.4 pg (ref 26.0–34.0)
MCHC: 34.1 g/dL (ref 30.0–36.0)
MCV: 95 fL (ref 80.0–100.0)
Platelets: 301 10*3/uL (ref 150–400)
RBC: 4.01 MIL/uL (ref 3.87–5.11)
RDW: 14.7 % (ref 11.5–15.5)
WBC: 9.8 10*3/uL (ref 4.0–10.5)
nRBC: 0 % (ref 0.0–0.2)

## 2023-11-26 LAB — TROPONIN I (HIGH SENSITIVITY)
Troponin I (High Sensitivity): <2 ng/L (ref <18)
Troponin I (High Sensitivity): <2 ng/L (ref <18)

## 2023-11-26 LAB — HCG, SERUM, QUALITATIVE: Preg, Serum: NEGATIVE

## 2023-11-26 MED ORDER — ONDANSETRON 4 MG PO TBDP
4.0000 mg | ORAL_TABLET | Freq: Once | ORAL | Status: AC
  Administered 2023-11-26: 4 mg via ORAL
  Filled 2023-11-26: qty 1

## 2023-11-26 NOTE — ED Triage Notes (Signed)
 Patient reports cough, congestion, nausea, loss of appetite and chest pain x 4 days. Denies fevers.

## 2023-11-27 MED ORDER — ONDANSETRON 4 MG PO TBDP: 4.0000 mg | ORAL_TABLET | Freq: Three times a day (TID) | ORAL | 0 refills | Status: DC | PRN

## 2023-11-27 MED ORDER — ONDANSETRON 4 MG PO TBDP
4.0000 mg | ORAL_TABLET | Freq: Once | ORAL | Status: AC
  Administered 2023-11-27: 4 mg via ORAL
  Filled 2023-11-27: qty 1

## 2023-11-27 MED ORDER — BENZONATATE 100 MG PO CAPS: 100.0000 mg | ORAL_CAPSULE | Freq: Three times a day (TID) | ORAL | 0 refills | Status: DC

## 2023-11-27 NOTE — Discharge Instructions (Signed)
 Your symptoms are likely due to the flu.  I have written you for a few medications to help with your symptoms  Return for new or worsening symptoms

## 2023-11-27 NOTE — ED Provider Notes (Signed)
 [place] EMERGENCY DEPARTMENT AT [facility] Provider Note   CSN: [id number] Arrival date & time: 11/26/23  1802    History  Chief Complaint  Patient presents with   Chest Pain   Nausea    Phyllis Alexander is a 28 y.o. female 5 days of feeling unwell cough, congestion, rhinorrhea, loss of appetite, nausea and intermittent emesis.  No abdominal pain, sob. Some cp with cough.  No fevers.  No sick contacts.  Has fatigue, decreased appetite. No pain currently. No LE swelling.  HPI     Home Medications Prior to Admission medications   Medication Sig Start Date End Date Taking? Authorizing Provider  benzonatate  (TESSALON ) 100 MG capsule Take 1 capsule (100 mg total) by mouth every 8 (eight) hours. 11/27/23  Yes Sherrin Stahle A, PA-C  ondansetron  (ZOFRAN -ODT) 4 MG disintegrating tablet Take 1 tablet (4 mg total) by mouth every 8 (eight) hours as needed. 11/27/23  Yes Abdulrahim Siddiqi A, PA-C  naproxen  (NAPROSYN ) 500 MG tablet Take 1 tablet (500 mg total) by mouth 2 (two) times daily. 10/12/23   Bero, Michael M, MD      Allergies    Patient has no known allergies.    Review of Systems   Review of Systems  Constitutional:  Positive for activity change, appetite change and fatigue.  HENT:  Positive for congestion, postnasal drip and rhinorrhea. Negative for sore throat, trouble swallowing and voice change.   Respiratory:  Positive for cough and shortness of breath.   Gastrointestinal:  Positive for nausea and vomiting.  Genitourinary: Negative.   Musculoskeletal: Negative.   Skin: Negative.   Neurological: Negative.   All other systems reviewed and are negative.   Physical Exam Updated Vital Signs BP 131/80   Pulse 79   Temp 99.9 F (37.7 C) (Oral)   Resp 18   Ht 5\' 8"  (1.727 m)   Wt 70.3 kg   SpO2 100%   BMI 23.57 kg/m  Physical Exam Vitals and nursing note reviewed.  Constitutional:      General: She is not in acute distress.    Appearance: She is  well-developed. She is not ill-appearing, toxic-appearing or diaphoretic.  HENT:     Head: Atraumatic.  Eyes:     Pupils: Pupils are equal, round, and reactive to light.  Cardiovascular:     Rate and Rhythm: Normal rate.     Pulses:          Carotid pulses are 2+ on the right side and 2+ on the left side.      Radial pulses are 2+ on the right side and 2+ on the left side.     Heart sounds: Normal heart sounds.  Pulmonary:     Effort: Pulmonary effort is normal. No respiratory distress.     Breath sounds: Normal breath sounds.  Abdominal:     General: There is no distension.  Musculoskeletal:        General: Normal range of motion.     Cervical back: Normal range of motion.     Right lower leg: No tenderness. No edema.     Left lower leg: No tenderness. No edema.  Skin:    General: Skin is warm and dry.     Capillary Refill: Capillary refill takes less than 2 seconds.  Neurological:     General: No focal deficit present.     Mental Status: She is alert.  Psychiatric:        Mood and Affect:  Mood normal.    ED Results / Procedures / Treatments   Labs (all labs ordered are listed, but only abnormal results are displayed) Labs Reviewed  RESP PANEL BY RT-PCR (RSV, FLU A&B, COVID)  RVPGX2 - Abnormal; Notable for the following components:      Result Value   Influenza A by PCR POSITIVE (*)    All other components within normal limits  BASIC METABOLIC PANEL - Abnormal; Notable for the following components:   Sodium 132 (*)    Potassium 3.2 (*)    CO2 19 (*)    BUN <5 (*)    All other components within normal limits  CBC  HCG, SERUM, QUALITATIVE  TROPONIN I (HIGH SENSITIVITY)  TROPONIN I (HIGH SENSITIVITY)    EKG None  Radiology No results found.  Procedures Procedures    Medications Ordered in ED Medications  ondansetron  (ZOFRAN -ODT) disintegrating tablet 4 mg (4 mg Oral Given 11/26/23 1834)  ondansetron  (ZOFRAN -ODT) disintegrating tablet 4 mg (4 mg Oral Given  11/27/23 0208)   ED Course/ Medical Decision Making/ A&P   28 year old here for evaluation of 5 days of feeling unwell.  Cough, congestion, rhinorrhea, nausea and vomiting.  No sick contacts.  Afebrile, nonseptic, not ill-appearing.  Heart and lungs are clear.  Abdomen soft, tender.  She appears clinically well-hydrated.  Plan on labs, imaging and reassess  Labs and imaging personally viewed and interpreted:  CBC without leukocytosis, troponin less than 2 Pregnancy test negative Metabolic panel sodium 132, potassium 3.2 Influenza positive EKG without ischemic changes  Patient reassessed.  Tolerating p.o. intake.  Discussed labs and EKG.  Symptoms likely due to influenza.  However symptomatic management, encourage potassium rich foods at home due to potassium 3.2, follow-up with PCP.  Patient agreeable. PERC neg  The patient has been appropriately medically screened and/or stabilized in the ED. I have low suspicion for any other emergent medical condition which would require further screening, evaluation or treatment in the ED or require inpatient management.  Patient is hemodynamically stable and in no acute distress.  Patient able to ambulate in department prior to ED.  Evaluation does not show acute pathology that would require ongoing or additional emergent interventions while in the emergency department or further inpatient treatment.  I have discussed the diagnosis with the patient and answered all questions.  Pain is been managed while in the emergency department and patient has no further complaints prior to discharge.  Patient is comfortable with plan discussed in room and is stable for discharge at this time.  I have discussed strict return precautions for returning to the emergency department.  Patient was encouraged to follow-up with PCP/specialist refer to at discharge.                                Medical Decision Making Amount and/or Complexity of Data Reviewed External Data  Reviewed: labs, radiology, ECG and notes. Labs: ordered. Decision-making details documented in ED Course. ECG/medicine tests: ordered and independent interpretation performed. Decision-making details documented in ED Course.  Risk OTC drugs. Prescription drug management. Decision regarding hospitalization. Diagnosis or treatment significantly limited by social determinants of health.          Final Clinical Impression(s) / ED Diagnoses Final diagnoses:  Influenza    Rx / DC Orders ED Discharge Orders          Ordered    ondansetron  (ZOFRAN -ODT) 4 MG disintegrating tablet  Every 8 hours PRN        11/27/23 0242    benzonatate  (TESSALON ) 100 MG capsule  Every 8 hours        11/27/23 0242              Mitchel Delduca A, PA-C 11/27/23 0250    Kelsey Patricia, MD 11/27/23 [phone]

## 2023-11-27 NOTE — ED Notes (Signed)
Patient verbalizes understanding of discharge instructions. Opportunity for questioning and answers were provided. Armband removed by staff, pt discharged from ED. Ambulated out to lobby  

## 2023-11-28 ENCOUNTER — Ambulatory Visit: Payer: Self-pay | Admitting: Physician Assistant

## 2023-11-29 ENCOUNTER — Other Ambulatory Visit: Payer: BC Managed Care – PPO

## 2024-01-15 IMAGING — CT CT ABD-PELV W/ CM
2 of 4 series · 16 of 46 positions shown, 18 images · IV contrast (agent unspecified)
Comparison: CT abdomen and pelvis 07/21/2020

CLINICAL DATA: Right lower quadrant pain

EXAM:
CT ABDOMEN AND PELVIS WITH CONTRAST
TECHNIQUE: Multidetector CT imaging of the abdomen and pelvis was performed
using the standard protocol following bolus administration of
intravenous contrast.

[Series 2: axial st · axial · 0.78mm/px · z∈[-154,+261]mm · 13 of 95 slices shown, 15 images]
[im 6/95  soft-tissue]
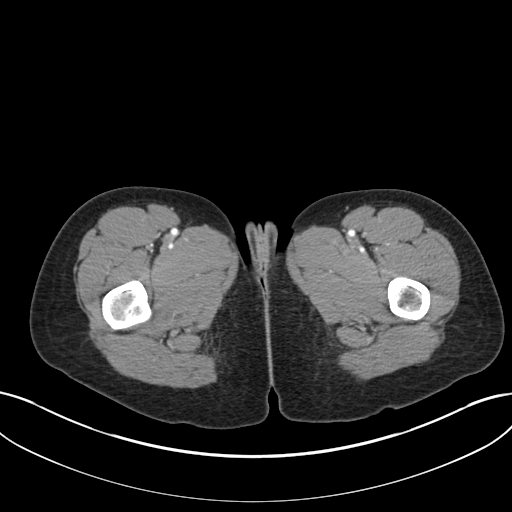
[im 6/95  bone]
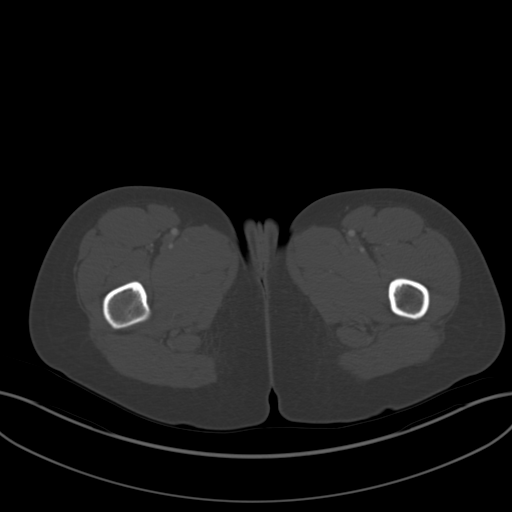
[im 12/95  soft-tissue]
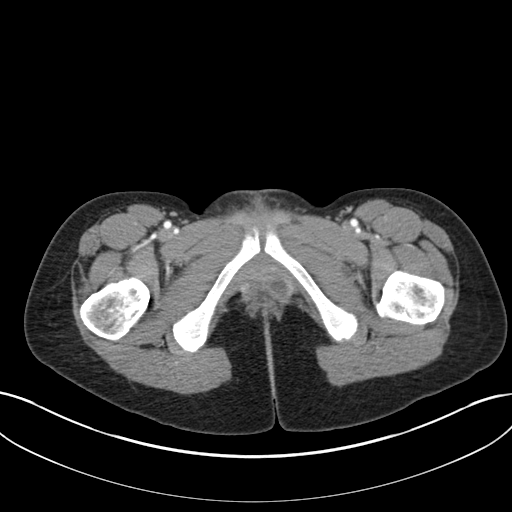
[im 23/95  soft-tissue]
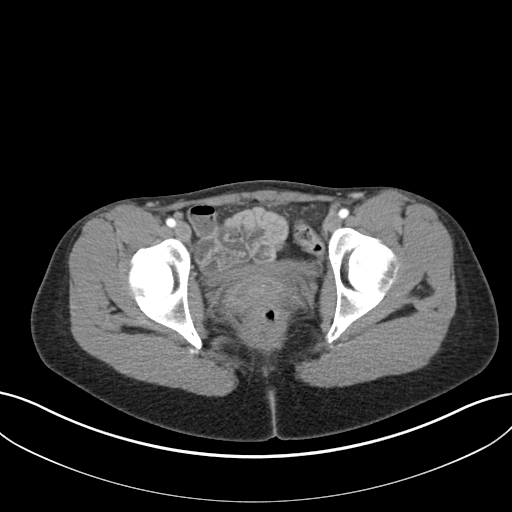
[im 28/95  soft-tissue]
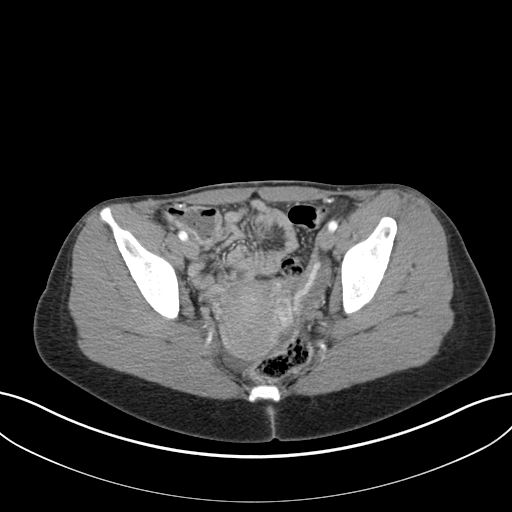
[im 34/95  soft-tissue]
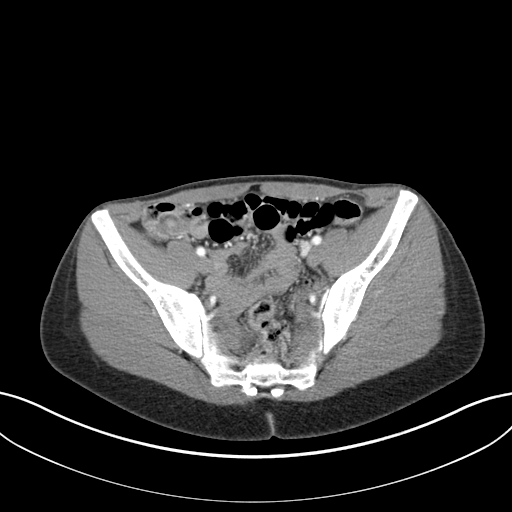
[im 39/95  soft-tissue]
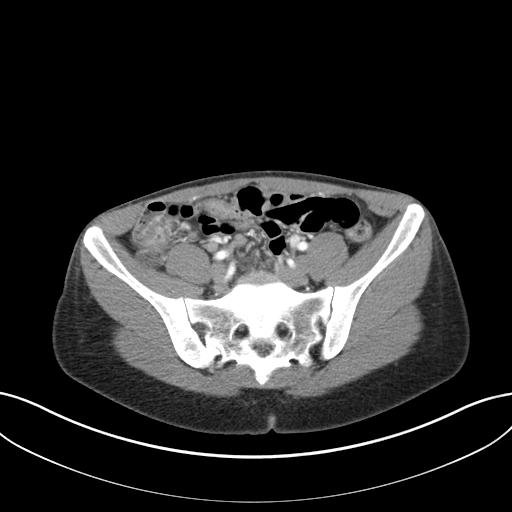
[im 50/95  soft-tissue]
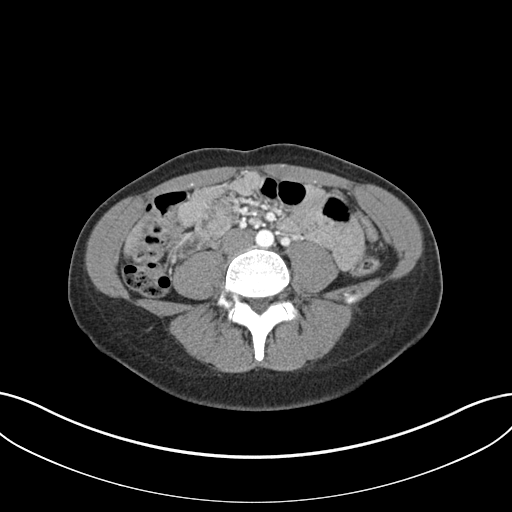
[im 56/95  soft-tissue]
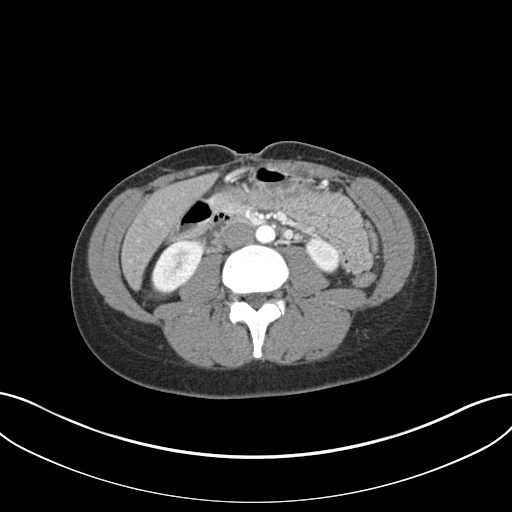
[im 61/95  soft-tissue]
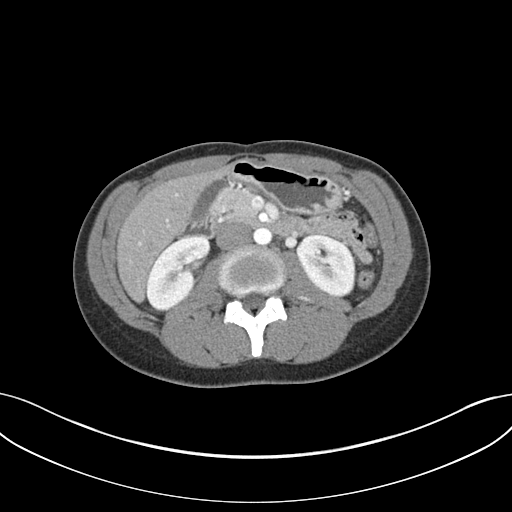
[im 61/95  bone]
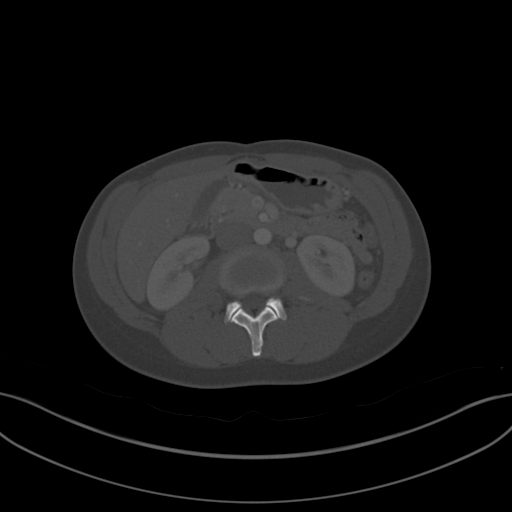
[im 67/95  soft-tissue]
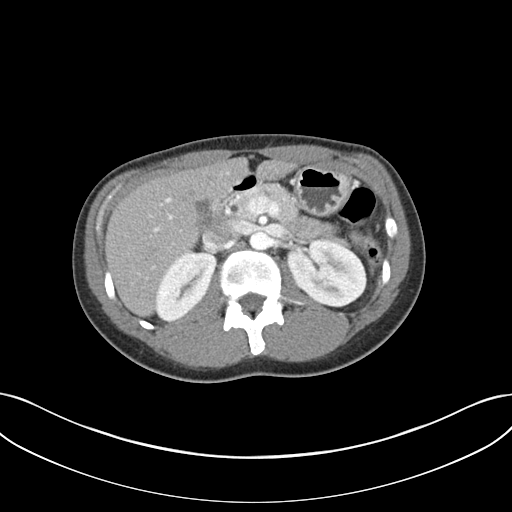
[im 72/95  soft-tissue]
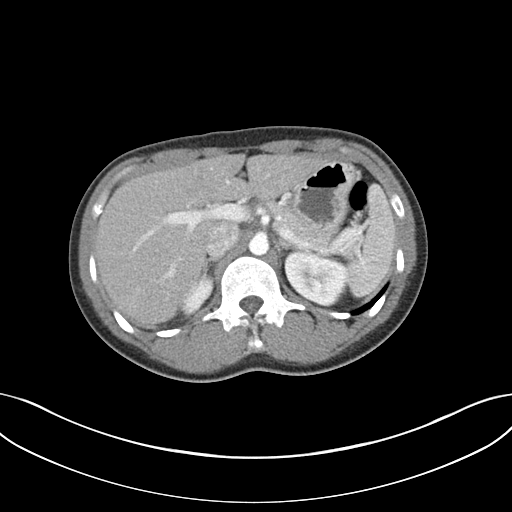
[im 83/95  soft-tissue]
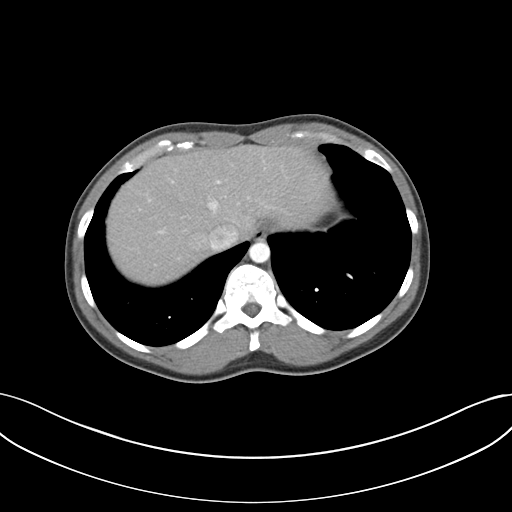
[im 89/95  soft-tissue]
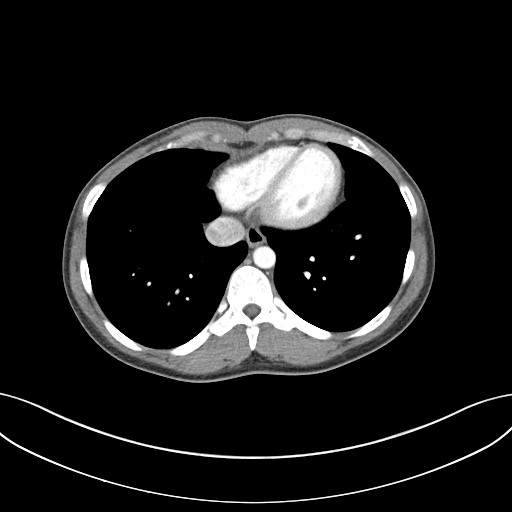

[Series 4: coronal st · coronal · 0.78mm/px · 3 of 120 slices shown]
[im 40/120  soft-tissue]
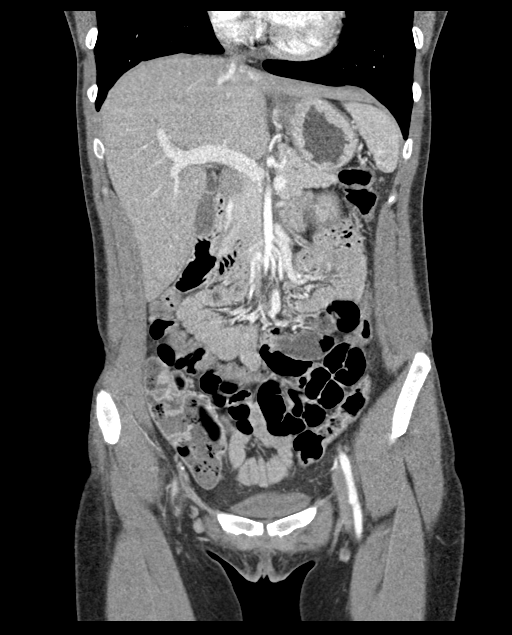
[im 53/120  soft-tissue]
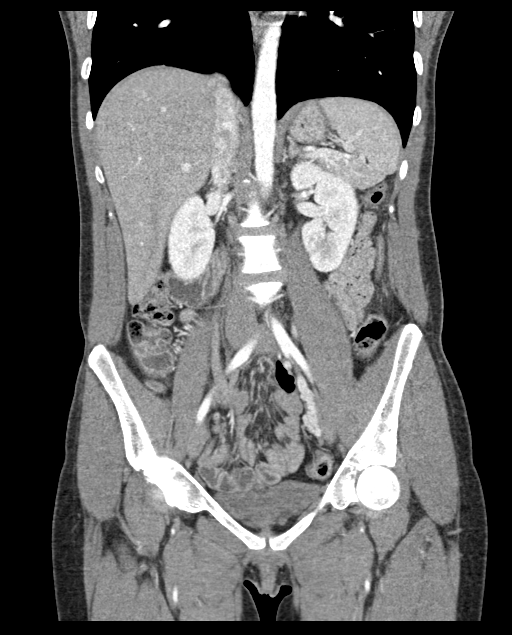
[im 67/120  soft-tissue]
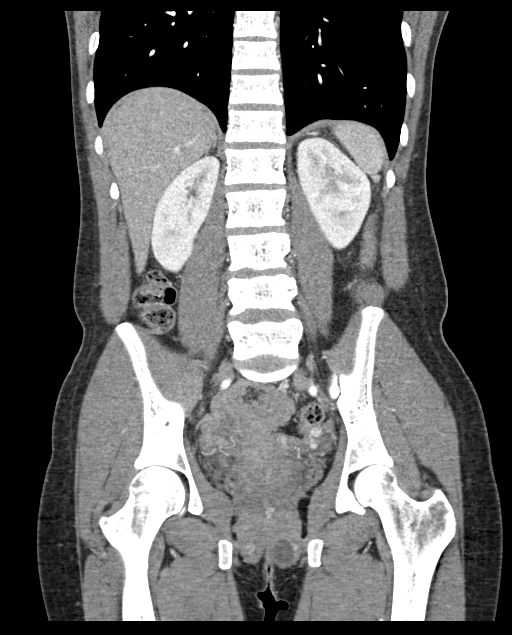

[16 of 46 positions shown; findings below may reference images not displayed]

RADIATION DOSE REDUCTION: This exam was performed according to the
departmental dose-optimization program which includes automated
exposure control, adjustment of the mA and/or kV according to
patient size and/or use of iterative reconstruction technique.

CONTRAST:  100mL OMNIPAQUE IOHEXOL 300 MG/ML  SOLN
FINDINGS: Lower chest: No acute abnormality.

Hepatobiliary: Liver is upper normal size with normal contour. No
suspicious hepatic mass identified. Mild hypodensity adjacent to the
falciform ligament likely represents focal fatty infiltration.
Gallbladder appears normal. No biliary ductal dilatation.

Pancreas: Unremarkable. No pancreatic ductal dilatation or
surrounding inflammatory changes.

Spleen: Normal in size without focal abnormality.

Adrenals/Urinary Tract: Adrenal glands are unremarkable. Kidneys are
normal, without renal calculi, focal lesion, or hydronephrosis.
Bladder is unremarkable.

Stomach/Bowel: No bowel obstruction, free air or pneumatosis. No
bowel wall edema. Appendix is normal.

Vascular/Lymphatic: No significant vascular findings are present. No
enlarged abdominal or pelvic lymph nodes.

Reproductive: Uterus and bilateral adnexa are unremarkable. Trace
free fluid in the pelvis most likely physiologic. 1.8 cm left likely
Bartholin's gland cyst, chronic.

Other: No abdominal wall hernia or abnormality. No abdominopelvic
ascites.

Musculoskeletal: No acute or significant osseous findings.
IMPRESSION: No acute process identified.  Other chronic findings as described.

## 2024-04-14 ENCOUNTER — Encounter: Payer: Self-pay | Admitting: Advanced Practice Midwife

## 2024-04-14 ENCOUNTER — Ambulatory Visit: Payer: Self-pay | Admitting: Advanced Practice Midwife

## 2024-04-14 VITALS — BP 109/74 | HR 67 | Ht 68.0 in | Wt 144.0 lb

## 2024-04-14 DIAGNOSIS — N921 Excessive and frequent menstruation with irregular cycle: Secondary | ICD-10-CM

## 2024-04-14 DIAGNOSIS — Z30013 Encounter for initial prescription of injectable contraceptive: Secondary | ICD-10-CM | POA: Diagnosis not present

## 2024-04-14 DIAGNOSIS — Z3009 Encounter for other general counseling and advice on contraception: Secondary | ICD-10-CM

## 2024-04-14 DIAGNOSIS — Z3042 Encounter for surveillance of injectable contraceptive: Secondary | ICD-10-CM | POA: Insufficient documentation

## 2024-04-14 LAB — POCT URINE PREGNANCY: Preg Test, Ur: NEGATIVE

## 2024-04-14 MED ORDER — MEDROXYPROGESTERONE ACETATE 150 MG/ML IM SUSP
150.0000 mg | INTRAMUSCULAR | 4 refills | Status: AC
Start: 2024-04-14 — End: ?

## 2024-04-14 MED ORDER — MEDROXYPROGESTERONE ACETATE 150 MG/ML IM SUSP
150.0000 mg | Freq: Once | INTRAMUSCULAR | Status: AC
  Administered 2024-04-14: 150 mg via INTRAMUSCULAR

## 2024-04-14 MED ORDER — NORETHIN ACE-ETH ESTRAD-FE 1-20 MG-MCG PO TABS
1.0000 | ORAL_TABLET | Freq: Every day | ORAL | 1 refills | Status: DC
Start: 2024-04-14 — End: 2024-06-16

## 2024-04-14 NOTE — Progress Notes (Signed)
 Pt presents for [facility] consult. Pt wants to get back on Depo Last unprotected sex 2-3 weeks ago  Negative UPT today

## 2024-04-14 NOTE — Progress Notes (Signed)
   GYNECOLOGY PROGRESS NOTE  History:  28 y.o. [id number] presents to [facility] office today for contraceptive visit. She reports no gyn concerns or problems. She has used Depo before and likes the method but the last time had bleeding for 3 months.   She denies h/a, dizziness, shortness of breath, n/v, or fever/chills.    The following portions of the patient's history were reviewed and updated as appropriate: allergies, current medications, past family history, past medical history, past social history, past surgical history and problem list. Last pap smear on 11/07/2022 was normal.   Health Maintenance Due  Topic Date Due   Pneumococcal Vaccine 31-53 Years old (1 of 2 - PCV) Never done     Review of Systems:  Pertinent items are noted in HPI.   Objective:  Physical Exam Blood pressure 109/74, pulse 67, height 5\' 8"  (1.727 m), weight 144 lb (65.3 kg). VS reviewed, nursing note reviewed,  Constitutional: well developed, well nourished, no distress HEENT: normocephalic CV: normal rate Pulm/chest wall: normal effort Breast Exam: deferred Abdomen: soft Neuro: alert and oriented x 3 Skin: warm, dry Psych: affect normal Pelvic exam: Cervix pink, visually closed, without lesion, scant white creamy discharge, vaginal walls and external genitalia normal Bimanual exam: Cervix 0/long/high, firm, anterior, neg CMT, uterus nontender, nonenlarged, adnexa without tenderness, enlargement, or mass  Assessment & Plan:  1. Birth control counseling (Primary) --Discussed pt contraceptive plans and reviewed contraceptive methods based on pt preferences and effectiveness.  Pt prefers Depo Provera .  - POCT urine pregnancy  2. Encounter for initial prescription of injectable contraceptive --First dose given in office today --UPT negative  - medroxyPROGESTERone  (DEPO-PROVERA ) 150 MG/ML injection; Inject 1 mL (150 mg total) into the muscle every 3 (three) months.  Dispense: 1 mL; Refill: 4  3.  Breakthrough bleeding on depo provera  --Hx of bleeding with initiation of Depo, so will start with 28 days of OCP then d/c pills and continue Depo only.    - norethindrone-ethinyl estradiol-FE (JUNEL FE 1/20) 1-20 MG-MCG tablet; Take 1 tablet by mouth daily.  Dispense: 28 tablet; Refill: 1   Return in about 3 months (around 07/15/2024) for RN visit for Depo.   Arlester Bence, CNM 2:35 PM

## 2024-04-25 ENCOUNTER — Encounter (HOSPITAL_COMMUNITY): Payer: Self-pay

## 2024-04-25 ENCOUNTER — Ambulatory Visit (HOSPITAL_COMMUNITY)
Admission: EM | Admit: 2024-04-25 | Discharge: 2024-04-25 | Disposition: A | Discharge status: Home / Self Care | Payer: Self-pay | Attending: Family Medicine | Admitting: Family Medicine

## 2024-04-25 DIAGNOSIS — M7701 Medial epicondylitis, right elbow: Secondary | ICD-10-CM

## 2024-04-25 NOTE — ED Triage Notes (Signed)
 Pt presents with right arm pain x 1 week. Patient states the pain starts at her elbow and radiates down to her right hand.  Home Intervention: None

## 2024-04-25 NOTE — Discharge Instructions (Signed)
 Take Prednisone as prescribed Recommend Stretching daily  Can apply ice Can wear elbow strap, picture printed

## 2024-04-25 NOTE — ED Provider Notes (Signed)
 [facility]    CSN: [id number] Arrival date & time: 04/25/24  1152      History   Chief Complaint No chief complaint on file.   HPI Corry Storie is a 28 y.o. female.   Patient presents with right arm pain that started about 1 week ago.  She reports pain starts at the elbow and radiates down her hand.  Reports some numbness and tingling down the arm.  She reports pain was worse this morning upon waking.  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  She has tried Tylenol  with minimal improvement.  Denies injury or trauma.  Patient is right-handed.    Past Medical History:  Diagnosis Date   Anemia    Anxiety    Encounter for supervision of normal pregnancy, unspecified, unspecified trimester 11/11/2019    Nursing Staff Provider Office Location  [facility] Dating  LMP  Language  English Anatomy US    f/u in 4 weeks  Flu Vaccine  Declined 11/11/19 Genetic Screen  NIPS: low risks AFP: 11/11/2019  neg  TDaP vaccine   Given 01/13/20 Hgb A1C or  GTT Early  Third trimester nl 2 hour Rhogam  NA   LAB RESULTS  Feeding Plan Breast Blood Type --/--/A POS (05/03 0024)  Contraception Depo Antibody NEG (05/03 0024) Ci   Medical history non-contributory    Pregnancy affected by fetal growth restriction 03/09/2020   Previous cesarean delivery affecting pregnancy, antepartum 11/11/2019   Desires TOLAC   Seizures (HCC)    VBAC (vaginal birth after Cesarean) 03/14/2020    Patient Active Problem List   Diagnosis Date Noted   Depo-Provera  contraceptive status 04/14/2024   Hallux valgus of left foot 03/14/2023   Insomnia 03/14/2023   Lumbar pain 03/14/2023   Chlamydia 08/02/2020   History of pregnancy complication 11/11/2019    Past Surgical History:  Procedure Laterality Date   CESAREAN SECTION     DILATION AND CURETTAGE OF UTERUS     WISDOM TOOTH EXTRACTION      OB History     Gravida  4   Para  3   Term  2   Preterm  0   AB  1   Living  2      SAB  0    IAB  1   Ectopic  0   Multiple  1   Live Births  4        Obstetric Comments  Previable twins delivery with PPROM          Home Medications    Prior to Admission medications   Medication Sig Start Date End Date Taking? Authorizing Provider  medroxyPROGESTERone  (DEPO-PROVERA ) 150 MG/ML injection Inject 1 mL (150 mg total) into the muscle every 3 (three) months. 04/14/24  Yes Leftwich-Kirby, Darren Em, CNM  norethindrone-ethinyl estradiol-FE (JUNEL FE 1/20) 1-20 MG-MCG tablet Take 1 tablet by mouth daily. 04/14/24  Yes Leftwich-Kirby, Darren Em, CNM    Family History Family History  Problem Relation Age of Onset   Healthy Mother    Healthy Father    ADD / ADHD Brother    Hypertension Maternal Aunt    Arthritis Maternal Grandmother    Diabetes Maternal Grandmother    Hypertension Maternal Grandmother    Stroke Maternal Grandmother    Arthritis Paternal Grandmother    Diabetes Other     Social History Social History   Tobacco Use   Smoking status: Every Day    Current packs/day: 0.00  Average packs/day: 0.5 packs/day for 10.0 years (5.0 ttl pk-yrs)    Types: Cigarettes    Start date: 08/15/2008    Last attempt to quit: 08/15/2018    Years since quitting: 5.6   Smokeless tobacco: Never  Vaping Use   Vaping status: Never Used  Substance Use Topics   Alcohol use: Not Currently    Alcohol/week: 6.0 standard drinks of alcohol    Types: 6 Shots of liquor per week   Drug use: Not Currently    Types: Marijuana    Comment: 01/17/22     Allergies   Patient has no known allergies.   Review of Systems Review of Systems  Constitutional:  Negative for chills and fever.  HENT:  Negative for ear pain and sore throat.   Eyes:  Negative for pain and visual disturbance.  Respiratory:  Negative for cough and shortness of breath.   Cardiovascular:  Negative for chest pain and palpitations.  Gastrointestinal:  Negative for abdominal pain and vomiting.  Genitourinary:  Negative  for dysuria and hematuria.  Musculoskeletal:  Positive for myalgias. Negative for arthralgias and back pain.  Skin:  Negative for color change and rash.  Neurological:  Negative for seizures and syncope.  All other systems reviewed and are negative.    Physical Exam Triage Vital Signs ED Triage Vitals  Encounter Vitals Group     BP 04/25/24 1314 121/80     Girls Systolic BP Percentile --      Girls Diastolic BP Percentile --      Boys Systolic BP Percentile --      Boys Diastolic BP Percentile --      Pulse Rate 04/25/24 1314 76     Resp 04/25/24 1314 18     Temp 04/25/24 1314 98.5 F (36.9 C)     Temp Source 04/25/24 1314 Oral     SpO2 04/25/24 1314 97 %     Weight --      Height --      Head Circumference --      Peak Flow --      Pain Score 04/25/24 1316 7     Pain Loc --      Pain Education --      Exclude from Growth Chart --    No data found.  Updated Vital Signs BP 121/80 (BP Location: Left Arm)   Pulse 76   Temp 98.5 F (36.9 C) (Oral)   Resp 18   SpO2 97%   Visual Acuity Right Eye Distance:   Left Eye Distance:   Bilateral Distance:    Right Eye Near:   Left Eye Near:    Bilateral Near:     Physical Exam Vitals and nursing note reviewed.  Constitutional:      General: She is not in acute distress.    Appearance: She is well-developed.  HENT:     Head: Normocephalic and atraumatic.   Eyes:     Conjunctiva/sclera: Conjunctivae normal.    Cardiovascular:     Rate and Rhythm: Normal rate and regular rhythm.     Heart sounds: No murmur heard. Pulmonary:     Effort: Pulmonary effort is normal. No respiratory distress.     Breath sounds: Normal breath sounds.  Abdominal:     Palpations: Abdomen is soft.     Tenderness: There is no abdominal tenderness.   Musculoskeletal:        General: No swelling.     Cervical back: Neck supple.  Comments: Numbness and tingling reproducible with compression of the ulnar nerve along the medial  epicondyle, right arm.  Normal strength.  Normal sensation.   Skin:    General: Skin is warm and dry.     Capillary Refill: Capillary refill takes less than 2 seconds.   Neurological:     Mental Status: She is alert.   Psychiatric:        Mood and Affect: Mood normal.      UC Treatments / Results  Labs (all labs ordered are listed, but only abnormal results are displayed) Labs Reviewed - No data to display  EKG   Radiology No results found.  Procedures Procedures (including critical care time)  Medications Ordered in UC Medications - No data to display  Initial Impression / Assessment and Plan / UC Course  I have reviewed the triage vital signs and the nursing notes.  Pertinent labs & imaging results that were available during my care of the patient were reviewed by me and considered in my medical decision making (see chart for details).     Symptoms consistent with medial epicondylitis.  Stretches given.  Supportive care discussed.  Prednisone prescribed.  Return precautions discussed.  Work note given. Final Clinical Impressions(s) / UC Diagnoses   Final diagnoses:  Medial epicondylitis of right elbow     Discharge Instructions      Take Prednisone as prescribed Recommend Stretching daily  Can apply ice Can wear elbow strap, picture printed     ED Prescriptions   None    PDMP not reviewed this encounter.   Ward, Char Common, PA-C 04/25/24 1356

## 2024-06-13 ENCOUNTER — Encounter: Payer: Self-pay | Admitting: Advanced Practice Midwife

## 2024-06-16 ENCOUNTER — Other Ambulatory Visit: Payer: Self-pay

## 2024-06-16 ENCOUNTER — Other Ambulatory Visit: Payer: Self-pay | Admitting: Advanced Practice Midwife

## 2024-06-16 DIAGNOSIS — N921 Excessive and frequent menstruation with irregular cycle: Secondary | ICD-10-CM

## 2024-06-16 MED ORDER — NORETHIN ACE-ETH ESTRAD-FE 1-20 MG-MCG PO TABS: 1.0000 | ORAL_TABLET | Freq: Every day | ORAL | 0 refills | Status: DC

## 2024-06-16 NOTE — Progress Notes (Signed)
 One BC pill refill sent. Per [place]  Claudene, CNM pt is not allowed anymore refills. Will need to see Olam for appt if still needing BC pills while on depo.

## 2024-06-19 ENCOUNTER — Other Ambulatory Visit: Payer: Self-pay

## 2024-06-19 DIAGNOSIS — N921 Excessive and frequent menstruation with irregular cycle: Secondary | ICD-10-CM

## 2024-06-19 MED ORDER — NORETHIN ACE-ETH ESTRAD-FE 1-20 MG-MCG PO TABS: 1.0000 | ORAL_TABLET | Freq: Every day | ORAL | 0 refills | Status: AC

## 2024-07-14 ENCOUNTER — Other Ambulatory Visit: Payer: Self-pay | Admitting: Advanced Practice Midwife

## 2024-07-14 DIAGNOSIS — N921 Excessive and frequent menstruation with irregular cycle: Secondary | ICD-10-CM

## 2024-07-15 ENCOUNTER — Ambulatory Visit

## 2024-07-16 ENCOUNTER — Ambulatory Visit: Payer: Self-pay

## 2024-07-16 MED ORDER — MEDROXYPROGESTERONE ACETATE 150 MG/ML IM SUSP: 150.0000 mg | Freq: Once | INTRAMUSCULAR | Status: DC

## 2024-07-16 NOTE — Progress Notes (Signed)
 Pt presented to office for depo shot, insurance currently not active. Pt informed front office she cannot afford appt. Appt will be cancelled.

## 2024-07-16 NOTE — Progress Notes (Deleted)
 Date last pap: 11/07/22. Last Depo-Provera : 04/14/24. Side Effects if any: NA. Serum HCG indicated? NA. Depo-Provera  150 mg IM given by: Duwaine Galla, RN. Next appointment due Nov 20 - Dec 4.

## 2024-07-29 ENCOUNTER — Ambulatory Visit: Payer: Self-pay

## 2024-08-03 ENCOUNTER — Ambulatory Visit: Payer: Self-pay

## 2024-08-06 ENCOUNTER — Ambulatory Visit (INDEPENDENT_AMBULATORY_CARE_PROVIDER_SITE_OTHER): Payer: Self-pay

## 2024-08-06 VITALS — BP 112/74 | HR 99 | Wt 148.7 lb

## 2024-08-06 DIAGNOSIS — Z3042 Encounter for surveillance of injectable contraceptive: Secondary | ICD-10-CM

## 2024-08-06 DIAGNOSIS — Z3202 Encounter for pregnancy test, result negative: Secondary | ICD-10-CM

## 2024-08-06 LAB — POCT URINE PREGNANCY: Preg Test, Ur: NEGATIVE

## 2024-08-06 MED ORDER — MEDROXYPROGESTERONE ACETATE 150 MG/ML IM SUSP: 150.0000 mg | Freq: Once | INTRAMUSCULAR | Status: AC

## 2024-08-06 NOTE — Progress Notes (Signed)
 Pt is in the office for depo restart. Pt states that she had unprotected sex last week UPT is negative today Advised pt to refrain from unprotected intercourse and return in 2 weeks for 2nd UPT and depo, pt agreed.

## 2024-08-20 ENCOUNTER — Ambulatory Visit: Payer: Self-pay

## 2024-09-03 ENCOUNTER — Ambulatory Visit: Payer: Self-pay

## 2024-11-26 ENCOUNTER — Ambulatory Visit: Payer: Self-pay | Admitting: Obstetrics and Gynecology

## 2024-12-02 ENCOUNTER — Ambulatory Visit: Payer: Self-pay | Admitting: Obstetrics and Gynecology

## 2024-12-16 ENCOUNTER — Ambulatory Visit: Admitting: Obstetrics and Gynecology

## 2024-12-28 ENCOUNTER — Ambulatory Visit: Admitting: Obstetrics
